# Patient Record
Sex: Female | Born: 1935
Health system: Southern US, Community
[De-identification: ages and names within clinical notes are randomized; demographics above are authoritative.]

## PROBLEM LIST (undated history)

## (undated) DIAGNOSIS — C801 Malignant (primary) neoplasm, unspecified: Secondary | ICD-10-CM

## (undated) DIAGNOSIS — E785 Hyperlipidemia, unspecified: Secondary | ICD-10-CM

## (undated) DIAGNOSIS — E0590 Thyrotoxicosis, unspecified without thyrotoxic crisis or storm: Secondary | ICD-10-CM

## (undated) DIAGNOSIS — R011 Cardiac murmur, unspecified: Secondary | ICD-10-CM

## (undated) DIAGNOSIS — I10 Essential (primary) hypertension: Secondary | ICD-10-CM

## (undated) DIAGNOSIS — R12 Heartburn: Secondary | ICD-10-CM

## (undated) DIAGNOSIS — L570 Actinic keratosis: Secondary | ICD-10-CM

## (undated) HISTORY — PX: TOTAL HIP ARTHROPLASTY: SHX124

## (undated) HISTORY — DX: Thyrotoxicosis, unspecified without thyrotoxic crisis or storm: E05.90

## (undated) HISTORY — DX: Hyperlipidemia, unspecified: E78.5

## (undated) HISTORY — DX: Actinic keratosis: L57.0

## (undated) HISTORY — PX: MEDIAL PARTIAL KNEE REPLACEMENT: SHX5965

## (undated) HISTORY — DX: Cardiac murmur, unspecified: R01.1

## (undated) HISTORY — PX: GALLBLADDER SURGERY: SHX652

## (undated) HISTORY — PX: OTHER SURGICAL HISTORY: SHX169

## (undated) HISTORY — PX: EXCISIONAL HEMORRHOIDECTOMY: SHX1541

## (undated) HISTORY — DX: Heartburn: R12

## (undated) HISTORY — DX: Essential (primary) hypertension: I10

---

## 1959-08-18 HISTORY — PX: VAGINAL HYSTERECTOMY: SUR661

## 2004-10-18 ENCOUNTER — Other Ambulatory Visit: Payer: Self-pay

## 2004-10-18 ENCOUNTER — Emergency Department: Payer: Self-pay | Admitting: General Practice

## 2004-10-23 ENCOUNTER — Ambulatory Visit: Payer: Self-pay | Admitting: Internal Medicine

## 2004-10-30 ENCOUNTER — Ambulatory Visit: Payer: Self-pay | Admitting: Radiology

## 2004-11-02 ENCOUNTER — Emergency Department: Payer: Self-pay | Admitting: Emergency Medicine

## 2004-11-13 ENCOUNTER — Ambulatory Visit: Payer: Self-pay | Admitting: Internal Medicine

## 2004-11-18 ENCOUNTER — Ambulatory Visit: Payer: Self-pay | Admitting: Internal Medicine

## 2005-01-20 ENCOUNTER — Ambulatory Visit: Payer: Self-pay | Admitting: Internal Medicine

## 2005-05-29 ENCOUNTER — Ambulatory Visit: Payer: Self-pay | Admitting: Internal Medicine

## 2005-07-24 ENCOUNTER — Ambulatory Visit: Payer: Self-pay | Admitting: Internal Medicine

## 2006-06-02 ENCOUNTER — Ambulatory Visit: Payer: Self-pay | Admitting: Internal Medicine

## 2007-06-09 ENCOUNTER — Ambulatory Visit: Payer: Self-pay | Admitting: Internal Medicine

## 2008-01-03 ENCOUNTER — Ambulatory Visit: Payer: Self-pay

## 2008-02-16 ENCOUNTER — Ambulatory Visit: Payer: Self-pay | Admitting: Urology

## 2008-04-24 ENCOUNTER — Ambulatory Visit: Payer: Self-pay | Admitting: Unknown Physician Specialty

## 2008-07-04 ENCOUNTER — Ambulatory Visit: Payer: Self-pay | Admitting: Internal Medicine

## 2009-06-26 ENCOUNTER — Ambulatory Visit: Payer: Self-pay | Admitting: Otolaryngology

## 2009-07-09 ENCOUNTER — Ambulatory Visit: Payer: Self-pay | Admitting: Internal Medicine

## 2009-09-10 ENCOUNTER — Ambulatory Visit: Payer: Self-pay | Admitting: Unknown Physician Specialty

## 2009-12-30 ENCOUNTER — Encounter: Payer: Self-pay | Admitting: Otolaryngology

## 2010-01-15 ENCOUNTER — Encounter: Payer: Self-pay | Admitting: Otolaryngology

## 2010-02-14 ENCOUNTER — Encounter: Payer: Self-pay | Admitting: Otolaryngology

## 2010-07-18 ENCOUNTER — Ambulatory Visit: Payer: Self-pay | Admitting: Internal Medicine

## 2011-07-20 ENCOUNTER — Ambulatory Visit: Payer: Self-pay | Admitting: Internal Medicine

## 2011-11-12 ENCOUNTER — Ambulatory Visit: Payer: Self-pay | Admitting: Podiatry

## 2011-12-16 ENCOUNTER — Ambulatory Visit: Payer: Self-pay

## 2012-03-01 ENCOUNTER — Ambulatory Visit: Payer: Self-pay | Admitting: Unknown Physician Specialty

## 2012-03-21 ENCOUNTER — Ambulatory Visit: Payer: Self-pay | Admitting: Unknown Physician Specialty

## 2012-07-20 ENCOUNTER — Ambulatory Visit: Payer: Self-pay | Admitting: Internal Medicine

## 2013-06-09 ENCOUNTER — Inpatient Hospital Stay: Payer: Self-pay | Admitting: Internal Medicine

## 2013-06-09 LAB — CBC
HCT: 40.5 % (ref 35.0–47.0)
HGB: 14.2 g/dL (ref 12.0–16.0)
MCH: 31.8 pg (ref 26.0–34.0)
RBC: 4.47 10*6/uL (ref 3.80–5.20)

## 2013-06-09 LAB — HEMOGLOBIN A1C: Hemoglobin A1C: 5.9 % (ref 4.2–6.3)

## 2013-06-09 LAB — URINALYSIS, COMPLETE
Bacteria: NONE SEEN
Bilirubin,UR: NEGATIVE
Glucose,UR: NEGATIVE mg/dL (ref 0–75)
Ketone: NEGATIVE
Leukocyte Esterase: NEGATIVE
Ph: 6 (ref 4.5–8.0)
Protein: 30
Specific Gravity: 1.02 (ref 1.003–1.030)
Squamous Epithelial: NONE SEEN

## 2013-06-09 LAB — BASIC METABOLIC PANEL
Anion Gap: 5 — ABNORMAL LOW (ref 7–16)
BUN: 20 mg/dL — ABNORMAL HIGH (ref 7–18)
Co2: 25 mmol/L (ref 21–32)
EGFR (African American): 60 — ABNORMAL LOW
EGFR (Non-African Amer.): 51 — ABNORMAL LOW
Glucose: 134 mg/dL — ABNORMAL HIGH (ref 65–99)
Potassium: 5 mmol/L (ref 3.5–5.1)
Sodium: 134 mmol/L — ABNORMAL LOW (ref 136–145)

## 2013-06-09 LAB — TSH: Thyroid Stimulating Horm: 0.527 u[IU]/mL

## 2013-06-10 LAB — BASIC METABOLIC PANEL
Anion Gap: 3 — ABNORMAL LOW (ref 7–16)
Calcium, Total: 9.3 mg/dL (ref 8.5–10.1)
Chloride: 101 mmol/L (ref 98–107)
Co2: 29 mmol/L (ref 21–32)
Creatinine: 0.91 mg/dL (ref 0.60–1.30)
Glucose: 120 mg/dL — ABNORMAL HIGH (ref 65–99)
Sodium: 133 mmol/L — ABNORMAL LOW (ref 136–145)

## 2013-06-10 LAB — CBC WITH DIFFERENTIAL/PLATELET
Basophil %: 0.5 %
HCT: 34.5 % — ABNORMAL LOW (ref 35.0–47.0)
HGB: 12.1 g/dL (ref 12.0–16.0)
Lymphocyte %: 19.5 %
MCHC: 35.1 g/dL (ref 32.0–36.0)
MCV: 91 fL (ref 80–100)
Monocyte #: 0.9 x10 3/mm (ref 0.2–0.9)
Monocyte %: 7.8 %
Neutrophil #: 7.7 10*3/uL — ABNORMAL HIGH (ref 1.4–6.5)
Neutrophil %: 70.9 %
RDW: 13.7 % (ref 11.5–14.5)

## 2013-06-11 LAB — CBC WITH DIFFERENTIAL/PLATELET
Basophil #: 0 10*3/uL (ref 0.0–0.1)
Basophil %: 0.4 %
Eosinophil #: 0.4 10*3/uL (ref 0.0–0.7)
Eosinophil %: 3.5 %
HCT: 30.2 % — ABNORMAL LOW (ref 35.0–47.0)
HGB: 10.8 g/dL — ABNORMAL LOW (ref 12.0–16.0)
Lymphocyte #: 1.5 10*3/uL (ref 1.0–3.6)
Lymphocyte %: 13.9 %
MCH: 32.4 pg (ref 26.0–34.0)
MCHC: 35.8 g/dL (ref 32.0–36.0)
MCV: 90 fL (ref 80–100)
Monocyte #: 0.9 x10 3/mm (ref 0.2–0.9)
Monocyte %: 8.3 %
Platelet: 159 10*3/uL (ref 150–440)
RBC: 3.35 10*6/uL — ABNORMAL LOW (ref 3.80–5.20)
RDW: 13.3 % (ref 11.5–14.5)
WBC: 11 10*3/uL (ref 3.6–11.0)

## 2013-06-11 LAB — BASIC METABOLIC PANEL
Anion Gap: 5 — ABNORMAL LOW (ref 7–16)
BUN: 8 mg/dL (ref 7–18)
Calcium, Total: 8.8 mg/dL (ref 8.5–10.1)
Co2: 26 mmol/L (ref 21–32)
Creatinine: 0.7 mg/dL (ref 0.60–1.30)
Potassium: 3.9 mmol/L (ref 3.5–5.1)
Sodium: 130 mmol/L — ABNORMAL LOW (ref 136–145)

## 2013-06-12 LAB — HEMOGLOBIN: HGB: 10 g/dL — ABNORMAL LOW (ref 12.0–16.0)

## 2013-06-12 LAB — BASIC METABOLIC PANEL
Anion Gap: 6 — ABNORMAL LOW (ref 7–16)
BUN: 6 mg/dL — ABNORMAL LOW (ref 7–18)
Calcium, Total: 8.9 mg/dL (ref 8.5–10.1)
Chloride: 101 mmol/L (ref 98–107)
Creatinine: 0.75 mg/dL (ref 0.60–1.30)
EGFR (African American): >60
EGFR (Non-African Amer.): >60
Glucose: 112 mg/dL — ABNORMAL HIGH (ref 65–99)
Osmolality: 265 (ref 275–301)

## 2013-06-13 LAB — CBC WITH DIFFERENTIAL/PLATELET
Basophil #: 0.1 10*3/uL (ref 0.0–0.1)
Eosinophil #: 0.3 10*3/uL (ref 0.0–0.7)
Eosinophil %: 3.3 %
Lymphocyte #: 1.5 10*3/uL (ref 1.0–3.6)
Lymphocyte %: 16.2 %
MCHC: 36.2 g/dL — ABNORMAL HIGH (ref 32.0–36.0)
MCV: 90 fL (ref 80–100)
Monocyte #: 0.9 x10 3/mm (ref 0.2–0.9)
Monocyte %: 9.1 %
Neutrophil #: 6.6 10*3/uL — ABNORMAL HIGH (ref 1.4–6.5)
Neutrophil %: 70.8 %
RDW: 13.5 % (ref 11.5–14.5)

## 2013-06-13 LAB — BASIC METABOLIC PANEL
Anion Gap: 2 — ABNORMAL LOW (ref 7–16)
BUN: 9 mg/dL (ref 7–18)
Chloride: 104 mmol/L (ref 98–107)
Creatinine: 0.76 mg/dL (ref 0.60–1.30)
EGFR (Non-African Amer.): >60
Glucose: 100 mg/dL — ABNORMAL HIGH (ref 65–99)
Sodium: 134 mmol/L — ABNORMAL LOW (ref 136–145)

## 2013-06-14 ENCOUNTER — Encounter: Payer: Self-pay | Admitting: Internal Medicine

## 2013-06-17 ENCOUNTER — Encounter: Payer: Self-pay | Admitting: Internal Medicine

## 2013-06-20 LAB — CBC WITH DIFFERENTIAL/PLATELET
Basophil: 2 %
Comment - H1-Com1: NORMAL
Eosinophil: 1 %
HGB: 12.2 g/dL (ref 12.0–16.0)
MCH: 31.6 pg (ref 26.0–34.0)
MCV: 90 fL (ref 80–100)
Metamyelocyte: 2 %
Platelet: 434 10*3/uL (ref 150–440)
RBC: 3.86 10*6/uL (ref 3.80–5.20)
RDW: 13.7 % (ref 11.5–14.5)
Segmented Neutrophils: 72 %
WBC: 10 10*3/uL (ref 3.6–11.0)

## 2013-06-20 LAB — BASIC METABOLIC PANEL
Anion Gap: 2 — ABNORMAL LOW (ref 7–16)
Calcium, Total: 10.7 mg/dL — ABNORMAL HIGH (ref 8.5–10.1)
Co2: 32 mmol/L (ref 21–32)
Creatinine: 0.8 mg/dL (ref 0.60–1.30)
EGFR (Non-African Amer.): >60
Osmolality: 269 (ref 275–301)
Potassium: 3.8 mmol/L (ref 3.5–5.1)
Sodium: 134 mmol/L — ABNORMAL LOW (ref 136–145)

## 2013-07-09 ENCOUNTER — Emergency Department: Payer: Self-pay | Admitting: Emergency Medicine

## 2013-07-09 LAB — COMPREHENSIVE METABOLIC PANEL
Anion Gap: 4 — ABNORMAL LOW (ref 7–16)
Bilirubin,Total: 0.5 mg/dL (ref 0.2–1.0)
Calcium, Total: 10.6 mg/dL — ABNORMAL HIGH (ref 8.5–10.1)
Chloride: 101 mmol/L (ref 98–107)
Creatinine: 0.9 mg/dL (ref 0.60–1.30)
EGFR (African American): >60
Osmolality: 271 (ref 275–301)
Sodium: 135 mmol/L — ABNORMAL LOW (ref 136–145)
Total Protein: 6.9 g/dL (ref 6.4–8.2)

## 2013-07-09 LAB — CBC
MCV: 91 fL (ref 80–100)
Platelet: 273 10*3/uL (ref 150–440)
RDW: 14.1 % (ref 11.5–14.5)
WBC: 8.6 10*3/uL (ref 3.6–11.0)

## 2013-07-09 LAB — TROPONIN I: Troponin-I: <0.02 ng/mL

## 2013-07-17 ENCOUNTER — Encounter: Payer: Self-pay | Admitting: Internal Medicine

## 2013-08-17 ENCOUNTER — Encounter: Payer: Self-pay | Admitting: Internal Medicine

## 2013-10-23 ENCOUNTER — Ambulatory Visit: Payer: Self-pay | Admitting: Internal Medicine

## 2014-04-02 DIAGNOSIS — E039 Hypothyroidism, unspecified: Secondary | ICD-10-CM | POA: Insufficient documentation

## 2014-12-04 ENCOUNTER — Other Ambulatory Visit (HOSPITAL_BASED_OUTPATIENT_CLINIC_OR_DEPARTMENT_OTHER): Payer: Self-pay | Admitting: Internal Medicine

## 2014-12-05 ENCOUNTER — Other Ambulatory Visit: Payer: Self-pay | Admitting: Internal Medicine

## 2014-12-05 DIAGNOSIS — Z1231 Encounter for screening mammogram for malignant neoplasm of breast: Secondary | ICD-10-CM

## 2014-12-07 NOTE — H&P (Signed)
PATIENT NAME:  Erica Lane, Erica Lane MR#:  [id number] DATE OF BIRTH:  1927-09-05  DATE OF ADMISSION:  06/09/2013  Addendum. This is a continuation.  MEDICATION LIST: Aspirin 81 mg p.o. daily, cyclobenzaprine 10 mg p.o. at bedtime, levothyroxine 100 mcg p.o. daily, metoprolol tartrate 25 mg twice daily, ranitidine 150 mg p.o. at bedtime, Reclast 5 mg in 100 mL intravenous solution once yearly, simvastatin 80 mg p.o. at bedtime, sucralfate 1 gram 4 times daily before meals and at bedtime and vitamin D2 at 50,000 units once a week.   ASSESSMENT AND PLAN: 1.  Right intratrochanteric hip fracture: The patient does have minimal risks for cardiovascular complications. She is okay to go to the operating room as long as her blood pressure is better controlled. 2.  Malignant hypertension: Suspect pain related. Will give 1 oral ACE inhibitor/lisinopril dose. Will follow the patient's blood pressure readings. Will continue the patient on metoprolol. Add also nitroglycerin topically. Follow blood pressure readings and we will make decisions about  blood pressure medications/advancement of blood pressure medications as needed.  3.  Hyponatremia: Very likely due to (Dictation Anomaly) resulting intravascular depletion after fracture. Will continue the patient on IV fluids. 4.  Hypercalcemia: Get TSH as well as intact PTH. 6.  Gastroesophageal reflux disease: The patient is not on proton pump inhibitor. We will continue ranitidine as well as sucralfate.  7.  Hyperlipidemia: Continue simvastatin.   TIME SPENT: 50 minutes.   ____________________________ Theodoro Grist, MD rv:jm D: 06/09/2013 17:36:15 ET T: 06/09/2013 18:31:17 ET JOB#: [id number]  cc: Theodoro Grist, MD, <Dictator> Kaiea Esselman MD ELECTRONICALLY SIGNED 07/15/2013 20:24

## 2014-12-07 NOTE — Discharge Summary (Signed)
PATIENT NAME:  Erica Lane, Erica Lane MR#:  [id number] DATE OF BIRTH:  08/12/1928  DATE OF ADMISSION:  06/09/2013 DATE OF DISCHARGE:  06/13/2013  TYPE OF DISCHARGE: The patient is transferred to skilled nursing facility.   REASON FOR ADMISSION: A right hip fracture.   HISTORY OF PRESENT ILLNESS: The patient is an 81 year old female with a history of benign hypertension, reflux, osteoporosis, and hyperlipidemia who presented to the Emergency Room after falling in the kitchen. In the Emergency Room, the patient was found to have a right hip fracture and was admitted for further evaluation.   PAST MEDICAL HISTORY: 1.  Benign hypertension.  2.  Hypothyroidism.  3.  GE reflux disease.  4.  Osteoporosis.  5.  Hyperlipidemia.  6.  Status post hysterectomy.  7.  Status post cholecystectomy.  8.  Status post cataract surgery.  9.  Status post right knee surgery.   MEDICATIONS ON ADMISSION: Please see admission note.   ALLERGIES: No known drug allergies.   SOCIAL HISTORY, FAMILY HISTORY, REVIEW OF SYSTEMS:  As per admission note.   PHYSICAL EXAM: GENERAL: The patient was in no acute distress.  VITAL SIGNS: Stable and she was afebrile.  HEENT exam was unremarkable.  NECK: Supple without JVD.  LUNGS:  Clear.  CARDIAC: Regular rate and rhythm. Normal S1 and S2. No significant murmurs.  ABDOMEN: Soft and nontender.  EXTREMITIES: Without edema.  NEUROLOGIC: Exam is grossly nonfocal.   HOSPITAL COURSE: The patient was admitted with a right hip fracture. Labs were essentially stable. She was seen in consultation by orthopedics and taken to the [facility] [place] on 10/25. She tolerated the hip surgery well. There were no complications. Postoperative, the patient was started with physical therapy and was ambulated while in the hospital. Her pain was well-controlled and there were no other issues that arose.  Short-term rehab was recommended and the family and patient agreed. She is now transferred to the skilled  nursing facility for further care and rehabilitation.   DISCHARGE DIAGNOSES: 1.  Right hip fracture, status post surgical repair.  2.  Benign hypertension.  3.  Anemia of chronic disease.  4.  Hyperlipidemia.  5.  Osteoporosis.  6.  Hypothyroidism.  7.  Status post hysterectomy.  8.  Status post cholecystectomy.   DISCHARGE MEDICATIONS: 1.  Flexeril 10 mg p.o. at bedtime.  2.  Vitamin D2 50,000 units p.o. q. week.  3.  Synthroid 100 mcg p.o. daily.  4.  Lopressor 25 mg p.o. b.i.d.  5.  Nitro-Dur 0.4 mg topically daily, off at bedtime.  6.  Protonix 40 mg p.o. q.a.m.  7.  Zantac 150 mg p.o. at bedtime.  8.  Zocor 80 mg p.o. at bedtime.  9.  Carafate 1 gram p.o. before meals and at bedtime.  10.  Norco 5/325, 1 to 2 p.o. every 6 hours p.r.n. pain.  11.  Zofran 4 mg p.o. q. 4 hours p.r.n. nausea and vomiting.  12.  Os-Cal D 1 p.o. b.i.d.  13.  Surfak 240 mg p.o. at bedtime.  14.  Iron sulfate 325 mg p.o. b.i.d.   FOLLOW-UP PLANS AND APPOINTMENTS: The patient will be followed by the resident physician at the skilled nursing facility. She is a FULL CODE. She is on a 2 gram sodium diet. She will be seen in consultation by physical therapy. We will obtain a CBC and a MET-B in 1 week.   ____________________________ Leonie Douglas. Doy Hutching, MD jds:dp D: 06/13/2013 07:22:45 ET T: 06/13/2013 07:48:09 ET JOB#: [id number]  cc: Leonie Douglas. Doy Hutching, MD, <Dictator> Fouad Taul Lennice Sites MD ELECTRONICALLY SIGNED 06/13/2013 13:21

## 2014-12-07 NOTE — H&P (Signed)
PATIENT NAME:  Erica Lane, Erica Lane MR#:  [id number] DATE OF BIRTH:  1928-07-04  PRIMARY CARE PHYSICIAN: Dr. Doy Hutching.   HISTORY OF PRESENT ILLNESS: The patient is an 81 year old Caucasian female with past medical history significant for history of hypertension, gastroesophageal reflux disease, osteoporosis, hyperlipidemia, who presents to the hospital after a fall. According to the patient, she was doing well. She was working at the [employer] after lunch and suddenly she turned around at the sink and her Crocs apparently stuck and she fell down on the right hip. She denies any loss of consciousness. She was not able to stand up and she was not even able to move her right leg.   On arrival to the Emergency Room, she was noted to be hypertensive with systolic blood pressure above 200s and she was noted also to have right intratrochanteric hip fracture. Hospitalist services were contacted for admission.   The patient denies any chest pains with activities. 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 She gets tired sometimes after significant exertion quicker than she used to be but still is able to do all activities of daily living and take care of her husband.   PAST MEDICAL HISTORY: Significant for history of partial hysterectomy, cholecystectomy, history of hypertension, gastroesophageal reflux disease, osteoporosis, hyperlipidemia, partial right knee replacement, hypothyroidism, status post cataract removal bilaterally.   ALLERGIES: None.   FAMILY HISTORY: Negative for early coronary artery disease, diabetes mellitus. The patient's mother had CVA as well as MI at the age of 59. The patient's father had intestinal cancer.   SOCIAL HISTORY: The patient is married and lives with her husband. No smoking, current or  prior, or alcohol abuse. 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 She  has two daughters and one son.   REVIEW OF SYSTEMS: Positive for pains in the right hip. Also headaches for over the past 2  weeks. Admits of having some recently diagnosed sinusitis. Admits of feeling tired intermittently, more frequently, in fact, than she used to be, but still able to do a lot. No chest pains. Intermittent UTIs. No fevers, chills, fatigue, weakness, weight loss or gain.  EYES: Denies blurry vision, double vision, glaucoma or cataracts.  ENT:  Denies any tinnitus, allergies, epistaxis, sinus tenderness or difficulty swallowing. RESPIRATORY: Denies wheezing, asthma, or COPD. CARDIOVASCULAR: Denies any chest pains, orthopnea, edema, arrhythmia, dyspnea on exertion, syncope.  GASTROINTESTINAL: Denies nausea, vomiting, diarrhea, constipation.  GENITOURINARY: Denies dysuria, hematuria, frequency or incontinence.  ENDOCRINE: Denies polydipsia, polyuria, nocturia, thyroid problems, heat or cold intolerance. HEMOLYMPHATIC: Denies anemia, easy bruising, bleeding or swollen glands.  SKIN: Denies any acne, rashes, lesions or change in moles.  MUSCULOSKELETAL: Denies arthritis, cramps, swelling.  NEUROLOGIC: Denies numbness, epilepsy or tremors. PSYCHIATRIC: Denies anxiety, insomnia, depression.   PHYSICAL EXAMINATION:  VITAL SIGNS: On arrival to the hospital, temperature 97.7. Pulse was 61. Respiratory rate was 18. Blood pressure 229/101. Saturation was 95% on room air.  GENERAL: A well-developed, well-nourished, Caucasian female in no significant distress.  HEENT: Pupils equal, react to light. Extraocular muscles are intact. No icterus or conjunctivitis. Has normal hearing. No pharyngeal erythema. Mucosa is moist.  NECK: No masses. Supple, nontender, no thyroid mass. No adenopathy. No JVD or carotid bruits bilaterally. Full range of motion.  LUNGS: Clear to auscultation in all fields. No rales, rhonchi, diminished breath sounds or wheezing.  No labored inspiration, increased effort,  dullness to percussion or overt respiratory distress.  CARDIOVASCULAR: S1, S2 appreciated. (Dictation Anomaly)Rythm is regular . Chest is nontender to palpation.  EXTREMITIES: With 1+ pedal pulses. No lower extremity edema, clubbing or cyanosis was noted.  ABDOMEN: Soft, nontender. Bowel sounds are present. No hepatosplenomegaly. No other masses were noted.  RECTAL: Deferred.  MUSCLE STRENGTH: Able to move all extremities but the right lower extremity. No cyanosis, degenerative joint disease. Not able to assess her for kyphosis as she is lying in the bed. Gait is not tested.  SKIN: Did not reveal any rashes, lesions, erythema, nodularity or induration.  It was warm and dry to palpation.   LYMPHATICS:  No adenopathy in the cervical region.  NEUROLOGIC: Cranial nerves grossly intact. Sensory is intact. No dysarthria or aphasia. The patient is alert and oriented to time, person, and place, cooperative. Memory is good.  PSYCHIATRIC: No significant confusion, agitation or depression noted.   EKG showed a sinus brady at 59 beats per minute. Normal axis, ST abnormality. Consider digitalis effect according to EKG criteria. No acute ST-T changes, however, were noted.   LABORATORIES: BMP showed sodium of 134, BUN elevated to 20, glucose 134; otherwise, BMP was unremarkable. The patient's calcium level was elevated to 10.6. CBC within normal limits. Coagulation panel unremarkable.   URINALYSIS: Yellow clear urine, negative for glucose, bilirubin or ketones. Specific gravity 1.020, and pH was 6.0, negative for blood, 30 mg/dL protein, negative for nitrites or leukocyte esterase, 8 red blood cells, 1 white blood cell. No bacteria or epithelial cells were noted. Five hyaline casts as well as amorphous crystal were present.   RADIOLOGIC STUDIES: Chest, one-view x-ray, 24th of October 2014, showed no acute abnormality. No pneumothorax. No displaced rib fracture. Pelvis AP only revealed displaced right  intertrochanteric hip fracture with fracture of  lesser trochanter as well. Right hip, complete x-ray, 24th of  October 2014, showed right intratrochanteric hip fracture, displaced fracture of the lesser trochanter.   ASSESSMENT AND PLAN:  1.  Right hip fracture. Minimal risk   DICTATION ENDS HERE    ____________________________ Theodoro Grist, MD rv:np D: 06/09/2013 17:33:00 ET T: 06/09/2013 18:38:29 ET JOB#: [id number]  cc: Leonie Douglas. Doy Hutching, MD Theodoro Grist, MD, <Dictator>   [place] MD ELECTRONICALLY SIGNED 07/15/2013 16:57

## 2014-12-07 NOTE — Consult Note (Signed)
Brief Consult Note: Diagnosis: Right hip pain.   Patient was seen by consultant.   Recommend to proceed with surgery or procedure.   Recommend further assessment or treatment.   Orders entered.   Discussed with Attending MD.   Comments: 81 year old female twisted and fell in her kitchen this afternoon and fell on her right hip.  Brought to Emergency Room where exam and X-rays show a comminuted, displaced, intertrochanteric hip fracture. She is being admitted for medical evaluation and operative fixation of the fracture to assist in mobility and recovery.  Risks and benefits of surgery were discussed at length including but not limited to infection, non union, nerve or blood vessed damage, non union, need for repeat surgery, blood clots and lung emboli, and death. Patient and daughter agree with this plan.  Will stabilize overnight and fix in AM.   Exam:  Alert and cooperative.  circulation/sensation/motor function good right leg.  Leg is short and rotated.  Severe pain with range of motion.  Skin normal. No edema.  Denies pain in rest of skeleton.    X-rays: as above  Plan:  open reduction and internal fixation right hip in AM with Trochanteric Fixation Nail.  Electronic Signatures: Park Breed (MD)  (Signed 24-Oct-14 19:59)  Authored: Brief Consult Note   Last Updated: 24-Oct-14 19:59 by Park Breed (MD)

## 2014-12-07 NOTE — Op Note (Signed)
PATIENT NAME:  Erica Lane, Erica Lane MR#:  [id number] DATE OF BIRTH:  09/06/1927  DATE OF PROCEDURE:  06/10/2013  PREOPERATIVE DIAGNOSIS:  Comminuted displaced right intratrochanteric hip fracture.   POSTOPERATIVE DIAGNOSIS:  Comminuted displaced right intratrochanteric hip fracture.   PROCEDURE PERFORMED:  Open reduction and internal fixation of right hip with a trochanteric fixation nail device (130-degree/11-mm intramedullary rod, 782-UM helical blade, a 35-TI distal screw).   SURGEON:  Park Breed, M.D.   ANESTHESIA:  Spinal.   COMPLICATIONS:  None.   DRAINS:  None.   ESTIMATED BLOOD LOSS:  50 mL.  REPLACEMENT:  None.   DESCRIPTION OF PROCEDURE:  The patient was brought to the Operating Room where she underwent a satisfactory spinal anesthesia, was placed on a fracture table. The left leg was flexed and abducted and  the right leg was placed in traction and internally rotated. Fluoroscopy showed good position of the fracture fragments. The hip was prepped and draped in sterile fashion and a short longitudinal incision was made above the trochanter. Dissection was carried out sharply through subcutaneous tissue and fascia with electrocautery used for hemostasis. A guide pin was placed in the tip of the trochanter, and the large drill used to open the trochanteric entry point. The 130-degree x 11-mm short TFN device was inserted and under fluoroscopic control. The distal stab wound was made and the aiming device for the guidepin was inserted. The guidepin was inserted in excellent position on AP and lateral view to the very tip of the head. A step-cut reamer was used to enlarge the opening in the lateral femur and a long reamer was used to open a canal for the blade. A 144-RX helical blade was then inserted and seated fully. The traction was released. The rod and helical blade were seen to be in excellent position and alignment. The locking screw was advanced from the top and backed off  slightly. The aiming guide for the distal locking screw was inserted after a small stab wound was made. This was drilled and a 36-mm distal locking screw inserted. The outside hardware removed. The wounds were irrigated. Final x-rays showed excellent position of the hardware and fracture fragments. The fascia was closed with 0 Vicryl. The subcutaneous tissues were all closed with 2-0 Vicryl, and the skin was closed with staples. A dry sterile dressing applied, and the patient was transferred to her hospital bed and taken to Recovery in good condition. The hip had excellent motion and good alignment.  ____________________________ Park Breed, MD hem:jm D: 06/10/2013 13:58:22 ET T: 06/10/2013 14:33:23 ET JOB#: [id number]  cc: Park Breed, MD, <Dictator> Park Breed MD ELECTRONICALLY SIGNED 06/10/2013 18:48

## 2014-12-11 ENCOUNTER — Other Ambulatory Visit: Payer: Self-pay | Admitting: Internal Medicine

## 2014-12-11 DIAGNOSIS — Z139 Encounter for screening, unspecified: Secondary | ICD-10-CM

## 2014-12-19 ENCOUNTER — Other Ambulatory Visit: Payer: Self-pay | Admitting: Internal Medicine

## 2014-12-19 ENCOUNTER — Ambulatory Visit
Admission: RE | Admit: 2014-12-19 | Discharge: 2014-12-19 | Disposition: A | Discharge status: Home / Self Care | Payer: Medicare Other | Source: Ambulatory Visit | Attending: Internal Medicine | Admitting: Internal Medicine

## 2014-12-19 DIAGNOSIS — Z1231 Encounter for screening mammogram for malignant neoplasm of breast: Secondary | ICD-10-CM

## 2014-12-19 HISTORY — DX: Malignant (primary) neoplasm, unspecified: C80.1

## 2015-10-04 DIAGNOSIS — I6523 Occlusion and stenosis of bilateral carotid arteries: Secondary | ICD-10-CM | POA: Insufficient documentation

## 2015-11-15 DIAGNOSIS — E782 Mixed hyperlipidemia: Secondary | ICD-10-CM | POA: Insufficient documentation

## 2015-11-15 DIAGNOSIS — I10 Essential (primary) hypertension: Secondary | ICD-10-CM | POA: Insufficient documentation

## 2015-11-15 DIAGNOSIS — I119 Hypertensive heart disease without heart failure: Secondary | ICD-10-CM | POA: Insufficient documentation

## 2015-12-06 ENCOUNTER — Other Ambulatory Visit: Payer: Self-pay | Admitting: Internal Medicine

## 2015-12-06 DIAGNOSIS — Z1231 Encounter for screening mammogram for malignant neoplasm of breast: Secondary | ICD-10-CM

## 2015-12-20 ENCOUNTER — Ambulatory Visit
Admission: RE | Admit: 2015-12-20 | Discharge: 2015-12-20 | Disposition: A | Discharge status: Home / Self Care | Payer: Medicare Other | Source: Ambulatory Visit | Attending: Internal Medicine | Admitting: Internal Medicine

## 2015-12-20 ENCOUNTER — Other Ambulatory Visit: Payer: Self-pay | Admitting: Internal Medicine

## 2015-12-20 DIAGNOSIS — Z1231 Encounter for screening mammogram for malignant neoplasm of breast: Secondary | ICD-10-CM | POA: Diagnosis present

## 2016-02-13 ENCOUNTER — Ambulatory Visit
Admission: RE | Admit: 2016-02-13 | Discharge: 2016-02-13 | Disposition: A | Discharge status: Home / Self Care | Payer: Medicare Other | Source: Ambulatory Visit | Attending: Physician Assistant | Admitting: Physician Assistant

## 2016-02-13 ENCOUNTER — Other Ambulatory Visit: Payer: Self-pay | Admitting: Physician Assistant

## 2016-02-13 DIAGNOSIS — M25475 Effusion, left foot: Secondary | ICD-10-CM

## 2016-09-07 DIAGNOSIS — E782 Mixed hyperlipidemia: Secondary | ICD-10-CM | POA: Diagnosis not present

## 2016-09-07 DIAGNOSIS — Z79899 Other long term (current) drug therapy: Secondary | ICD-10-CM | POA: Diagnosis not present

## 2016-09-07 DIAGNOSIS — E119 Type 2 diabetes mellitus without complications: Secondary | ICD-10-CM | POA: Insufficient documentation

## 2016-09-07 DIAGNOSIS — I10 Essential (primary) hypertension: Secondary | ICD-10-CM | POA: Diagnosis not present

## 2016-09-07 DIAGNOSIS — N393 Stress incontinence (female) (male): Secondary | ICD-10-CM | POA: Diagnosis not present

## 2016-09-07 DIAGNOSIS — E039 Hypothyroidism, unspecified: Secondary | ICD-10-CM | POA: Diagnosis not present

## 2016-09-15 DIAGNOSIS — I10 Essential (primary) hypertension: Secondary | ICD-10-CM | POA: Diagnosis not present

## 2016-09-17 DIAGNOSIS — L570 Actinic keratosis: Secondary | ICD-10-CM | POA: Diagnosis not present

## 2016-09-22 NOTE — Progress Notes (Signed)
09/23/2016 9:15 AM   [name] 02/09/1928 ON:[id number]  Referring provider: Idelle Crouch, MD [facility] [place], [place] [address]  Chief Complaint  Patient presents with  . New Patient (Initial Visit)    Hematuria referred by Dr. Doy Hutching    HPI: Patient is a 83 -year-old Caucasian female who presents today as a referral from their PCP, Dr. Doy Hutching, for microscopic hematuria.    Patient was found to have microscopic hematuria on 3 occasions with 4-10 RBC's/hpf.  Patient does have a prior history of microscopic hematuria and had a hematuria work up in 2009 with Dr. Jacqlyn Larsen.  Findings positive for stable CT from 11/07/2004. There is no evidence of obstructing calcified ureteral stone.  Multiple, stable, hepatic hemangiomas.  Stable, mild, RIGHT UPJ partial obstruction.  Stable, LEFT adnexal cyst.  Stable pancreatic head fullness and lucency. There has been no progression to suggest a progressive malignancy. No biliary obstruction is  noted. The CT is again stable from 10/18/2004.   She states she has a prior history of recurrent urinary tract infections, but there are no documented infections in the chart.   She does have a prior history of nephrolithiasis, trauma to the genitourinary tract or malignancies of the genitourinary tract.  She does not have a family medical history of nephrolithiasis, malignancies of the genitourinary tract or hematuria.   Today, she is having symptoms of urgency, nocturia x 2 and incontinence of stress and urge.  Her UA today demonstrates 3-10 RBC's.  She is not experiencing any suprapubic pain, abdominal pain or flank pain.  She denies any recent fevers, chills, nausea or vomiting.   She has not had any recent imaging studies.   She is not a smoker.  She is not exposed to secondhand smoke.  She has not worked with [employer].    PMH: Past Medical History:  Diagnosis Date  . Cancer ([place])    skin  . Heart  murmur   . Heartburn   . HLD (hyperlipidemia)   . HTN (hypertension)   . Hyperthyroidism     Surgical History: Past Surgical History:  Procedure Laterality Date  . broken foot    . EXCISIONAL HEMORRHOIDECTOMY    . GALLBLADDER SURGERY    . MEDIAL PARTIAL KNEE REPLACEMENT    . TOTAL HIP ARTHROPLASTY Right    from crushed hip    Home Medications:  Allergies as of 09/23/2016   No Known Allergies     Medication List       Accurate as of 09/23/16  9:15 AM. Always use your most recent med list.          aspirin EC 81 MG tablet Take by mouth.   carvedilol 6.25 MG tablet Commonly known as:  COREG Take by mouth.   DIGESTIVE ENZYMES PO Take by mouth.   hydrochlorothiazide 12.5 MG tablet Commonly known as:  HYDRODIURIL Take by mouth.   LACTOBACILLUS BIFIDUS PO Take by mouth.   levothyroxine 100 MCG tablet Commonly known as:  SYNTHROID, LEVOTHROID TAKE 1 TABLET (100 MCG TOTAL) BY MOUTH ONCE DAILY. TAKE ON AN EMPTY STOMACH WITH A GLASS OF WATER AT LEAST 30-60 MINUTES BEFORE BREAKFAST.   losartan 100 MG tablet Commonly known as:  COZAAR Take by mouth.   ranitidine 300 MG tablet Commonly known as:  ZANTAC Take by mouth.   SM CALCIUM 500/VITAMIN D3 500-400 MG-UNIT tablet Generic drug:  calcium-vitamin D Take by mouth.   solifenacin 5  MG tablet Commonly known as:  VESICARE Take by mouth.   sucralfate 1 g tablet Commonly known as:  CARAFATE Take by mouth.   Vitamin D (Ergocalciferol) 50000 units Caps capsule Commonly known as:  DRISDOL TAKE 1 CAPSULE BY MOUTH ONCE A WEEK       Allergies: No Known Allergies  Family History: Family History  Problem Relation Age of Onset  . Cancer Sister 79    stomach  . Prostate cancer Neg Hx   . Kidney cancer Neg Hx   . Bladder Cancer Neg Hx     Social History:  reports that she has never smoked. She has never used smokeless tobacco. She reports that she drinks alcohol. She reports that she does not use  drugs.  ROS: UROLOGY Frequent Urination?: Yes Hard to postpone urination?: Yes Burning/pain with urination?: No Get up at night to urinate?: Yes Leakage of urine?: Yes Urine stream starts and stops?: No Trouble starting stream?: No Do you have to strain to urinate?: No Blood in urine?: No Urinary tract infection?: No Sexually transmitted disease?: No Injury to kidneys or bladder?: No Painful intercourse?: No Weak stream?: No Currently pregnant?: No Vaginal bleeding?: No Last menstrual period?: n  Gastrointestinal Nausea?: No Vomiting?: No Indigestion/heartburn?: No Diarrhea?: No Constipation?: No  Constitutional Fever: No Night sweats?: No Weight loss?: No Fatigue?: No  Skin Skin rash/lesions?: No Itching?: No  Eyes Blurred vision?: No Double vision?: No  Ears/Nose/Throat Sore throat?: No Sinus problems?: No  Hematologic/Lymphatic Swollen glands?: No Easy bruising?: No  Cardiovascular Leg swelling?: No Chest pain?: No  Respiratory Cough?: No Shortness of breath?: No  Endocrine Excessive thirst?: No  Musculoskeletal Back pain?: No Joint pain?: No  Neurological Headaches?: No Dizziness?: No  Psychologic Depression?: No Anxiety?: No  Physical Exam: BP (!) 164/73   Pulse 61   Ht 5\' 3"  (1.6 m)   Wt 148 lb (67.1 kg)   BMI 26.22 kg/m   Constitutional: Well nourished. Alert and oriented, No acute distress. HEENT: [place] AT, moist mucus membranes. Trachea midline, no masses. Cardiovascular: No clubbing, cyanosis, or edema. Respiratory: Normal respiratory effort, no increased work of breathing. GI: Abdomen is soft, non tender, non distended, no abdominal masses. Liver and spleen not palpable.  No hernias appreciated.  Stool sample for occult testing is not indicated.   GU: No CVA tenderness.  No bladder fullness or masses.   Skin: No rashes, bruises or suspicious lesions. Lymph: No cervical or inguinal adenopathy. Neurologic: Grossly  intact, no focal deficits, moving all 4 extremities. Psychiatric: Normal mood and affect.  Laboratory Data: Lab Results  Component Value Date   WBC 8.6 07/09/2013   HGB 13.5 07/09/2013   HCT 38.8 07/09/2013   MCV 91 07/09/2013   PLT 273 07/09/2013    Lab Results  Component Value Date   CREATININE 0.90 07/09/2013     Lab Results  Component Value Date   HGBA1C 5.9 06/09/2013    Lab Results  Component Value Date   TSH 0.527 06/09/2013    Lab Results  Component Value Date   AST 18 07/09/2013   Lab Results  Component Value Date   ALT 17 07/09/2013     Urinalysis 3-10 RBC's.  See EPIC.    Assessment & Plan:     1. Microscopic hematuria  - I explained to the patient that there are a number of causes that can be associated with blood in the urine, such as stones, UTI's, damage to the urinary tract  and/or cancer.  - At this time, I felt that the patient warranted further urologic evaluation.   The AUA guidelines state that a CT urogram is the preferred imaging study to evaluate hematuria.  - I explained to the patient that a contrast material will be injected into a vein and that in rare instances, an allergic reaction can result and may even life threatening   The patient denies any allergies to contrast, iodine and/or seafood and is not taking metformin.  - Her reproductive status is hysterectomy  - Following the imaging study,  I've recommended a cystoscopy. I described how this is performed, typically in an office setting with a flexible cystoscope. We described the risks, benefits, and possible side effects, the most common of which is a minor amount of blood in the urine and/or burning which usually resolves in 24 to 48 hours.    - The patient had the opportunity to ask questions which were answered. Based upon this discussion, the patient is willing to proceed. Therefore, I've ordered: a CT Urogram and cystoscopy.  - The patient will return following all of the above  for discussion of the results.     - UA  - urine culture  - BUN + creatinine  2. Mixed incontinence  - wears light pads; not bothersome to the patient at this time  - she is unsure if she is taking Vesicare at this time  3. Nocturia  - she is not finding it bothersome at this time  - will continue to monitor      Return for CT Urogram report and cystoscopy.  These notes generated with voice recognition software. I apologize for typographical errors.  Zara Council, [facility] [address] [place],  [address] [phone]

## 2016-09-23 ENCOUNTER — Ambulatory Visit (INDEPENDENT_AMBULATORY_CARE_PROVIDER_SITE_OTHER): Payer: PPO | Admitting: Urology

## 2016-09-23 ENCOUNTER — Encounter: Payer: Self-pay | Admitting: Urology

## 2016-09-23 VITALS — BP 164/73 | HR 61 | Ht 63.0 in | Wt 148.0 lb

## 2016-09-23 DIAGNOSIS — R3129 Other microscopic hematuria: Secondary | ICD-10-CM

## 2016-09-23 DIAGNOSIS — R351 Nocturia: Secondary | ICD-10-CM

## 2016-09-23 DIAGNOSIS — N3946 Mixed incontinence: Secondary | ICD-10-CM

## 2016-09-23 NOTE — Patient Instructions (Addendum)
Hematuria, Adult Hematuria is blood in your urine. It can be caused by a bladder infection, kidney infection, prostate infection, kidney stone, or cancer of your urinary tract. Infections can usually be treated with medicine, and a kidney stone usually will pass through your urine. If neither of these is the cause of your hematuria, further workup to find out the reason may be needed. It is very important that you tell your health care provider about any blood you see in your urine, even if the blood stops without treatment or happens without causing pain. Blood in your urine that happens and then stops and then happens again can be a symptom of a very serious condition. Also, pain is not a symptom in the initial stages of many urinary cancers. Follow these instructions at home:  Drink lots of fluid, 3-4 quarts a day. If you have been diagnosed with an infection, cranberry juice is especially recommended, in addition to large amounts of water.  Avoid caffeine, tea, and carbonated beverages because they tend to irritate the bladder.  Avoid alcohol because it may irritate the prostate.  Take all medicines as directed by your health care provider.  If you were prescribed an antibiotic medicine, finish it all even if you start to feel better.  If you have been diagnosed with a kidney stone, follow your health care provider's instructions regarding straining your urine to catch the stone.  Empty your bladder often. Avoid holding urine for long periods of time.  After a bowel movement, women should cleanse front to back. Use each tissue only once.  Empty your bladder before and after sexual intercourse if you are a female. Contact a health care provider if:  You develop back pain.  You have a fever.  You have a feeling of sickness in your stomach (nausea) or vomiting.  Your symptoms are not better in 3 days. Return sooner if you are getting worse. Get help right away if:  You develop  severe vomiting and are unable to keep the medicine down.  You develop severe back or abdominal pain despite taking your medicines.  You begin passing a large amount of blood or clots in your urine.  You feel extremely weak or faint, or you pass out. This information is not intended to replace advice given to you by your health care provider. Make sure you discuss any questions you have with your health care provider. Document Released: 08/03/2005 Document Revised: 01/09/2016 Document Reviewed: 04/03/2013 Elsevier Interactive Patient Education  2017 [place] A computed tomography (CT) scan is a specialized X-ray scan. It uses X-rays and a computer to make pictures of different areas of your body. A CT scan can offer more detailed information than a regular X-ray exam. The CT scan provides data about internal organs, soft tissue structures, blood vessels, and bones.  The CT scanner is a large machine that takes pictures of your body as you move through the opening.  LET [facility] CARE PROVIDER KNOW ABOUT:  Any allergies you have.   All medicines you are taking, including vitamins, herbs, eye drops, creams, and over-the-counter medicines.   Previous problems you or members of your family have had with the use of anesthetics.   Any blood disorders you have.   Previous surgeries you have had.   Medical conditions you have. RISKS AND COMPLICATIONS  Generally, this is a safe procedure. However, as with any procedure, problems can occur. Possible problems include:   An allergic reaction  to the contrast material.   Development of cancer from excessive exposure to radiation. The risk of this is small.  BEFORE THE PROCEDURE   The day before the test, stop drinking caffeinated beverages. These include energy drinks, tea, soda, coffee, and hot chocolate.   On the day of the test:  About 4 hours before the test, stop eating and drinking anything but water as advised  by your health care provider.   Avoid wearing jewelry. You will have to partly or fully undress and wear a hospital gown. PROCEDURE   You will be asked to lie on a table with your arms above your head.   If contrast dye is to be used for the test, an IV tube will be inserted in your arm. The contrast dye will be injected into the IV tube. You might feel warm, or you may get a metallic taste in your mouth.   The table you will be lying on will move into a large machine that will do the scanning.   You will be able to see, hear, and talk to the person running the machine while you are in it. Follow that person's directions.   The CT machine will move around you to take pictures. Do not move while it is scanning. This helps to get a good image.   When the best possible pictures have been taken, the machine will be turned off. The table will be moved out of the machine. The IV tube will then be removed. AFTER THE PROCEDURE  Ask your health care provider when to follow up for your test results. This information is not intended to replace advice given to you by your health care provider. Make sure you discuss any questions you have with your health care provider. Document Released: 09/10/2004 Document Revised: 08/08/2013 Document Reviewed: 04/10/2013 Elsevier Interactive Patient Education  2017 [place].  Cystoscopy Cystoscopy is a procedure that is used to help diagnose and sometimes treat conditions that affect that lower urinary tract. The lower urinary tract includes the bladder and the tube that drains urine from the bladder out of the body (urethra). Cystoscopy is performed with a thin, tube-shaped instrument with a light and camera at the end (cystoscope). The cystoscope may be hard (rigid) or flexible, depending on the goal of the procedure.The cystoscope is inserted through the urethra, into the bladder. Cystoscopy may be recommended if you have:  Urinary tractinfections that  keep coming back (recurring).  Blood in the urine (hematuria).  Loss of bladder control (urinary incontinence) or an overactive bladder.  Unusual cells found in a urine sample.  A blockage in the urethra.  Painful urination.  An abnormality in the bladder found during an intravenous pyelogram (IVP) or CT scan. Cystoscopy may also be done to remove a sample of tissue to be examined under a microscope (biopsy). Tell a health care provider about:  Any allergies you have.  All medicines you are taking, including vitamins, herbs, eye drops, creams, and over-the-counter medicines.  Any problems you or family members have had with anesthetic medicines.  Any blood disorders you have.  Any surgeries you have had.  Any medical conditions you have.  Whether you are pregnant or may be pregnant. What are the risks? Generally, this is a safe procedure. However, problems may occur, including:  Infection.  Bleeding.  Allergic reactions to medicines.  Damage to other structures or organs. What happens before the procedure?  Ask your health care provider about:  Changing  or stopping your regular medicines. This is especially important if you are taking diabetes medicines or blood thinners.  Taking medicines such as aspirin and ibuprofen. These medicines can thin your blood. Do not take these medicines before your procedure if your health care provider instructs you not to.  Follow instructions from your health care provider about eating or drinking restrictions.  You may be given antibiotic medicine to help prevent infection.  You may have an exam or testing, such as X-rays of the bladder, urethra, or kidneys.  You may have urine tests to check for signs of infection.  Plan to have someone take you home after the procedure. What happens during the procedure?  To reduce your risk of infection,your health care team will wash or sanitize their hands.  You will be given one or  more of the following:  A medicine to help you relax (sedative).  A medicine to numb the area (local anesthetic).  The area around the opening of your urethra will be cleaned.  The cystoscope will be passed through your urethra into your bladder.  Germ-free (sterile)fluid will flow through the cystoscope to fill your bladder. The fluid will stretch your bladder so that your surgeon can clearly examine your bladder walls.  The cystoscope will be removed and your bladder will be emptied. The procedure may vary among health care providers and hospitals. What happens after the procedure?  You may have some soreness or pain in your abdomen and urethra. Medicines will be available to help you.  You may have some blood in your urine.  Do not drive for 24 hours if you received a sedative. This information is not intended to replace advice given to you by your health care provider. Make sure you discuss any questions you have with your health care provider. Document Released: 07/31/2000 Document Revised: 12/12/2015 Document Reviewed: 06/20/2015 Elsevier Interactive Patient Education  2017 [place].  Cystoscopy, Care After Refer to this sheet in the next few weeks. These instructions provide you with information about caring for yourself after your procedure. Your health care provider may also give you more specific instructions. Your treatment has been planned according to current medical practices, but problems sometimes occur. Call your health care provider if you have any problems or questions after your procedure. What can I expect after the procedure? After the procedure, it is common to have:  Mild pain when you urinate. Pain should stop within a few minutes after you urinate. This may last for up to 1 week.  A small amount of blood in your urine for several days.  Feeling like you need to urinate but producing only a small amount of urine. Follow these instructions at home:     Medicines  Take over-the-counter and prescription medicines only as told by your health care provider.  If you were prescribed an antibiotic medicine, take it as told by your health care provider. Do not stop taking the antibiotic even if you start to feel better. General instructions  Return to your normal activities as told by your health care provider. Ask your health care provider what activities are safe for you.  Do not drive for 24 hours if you received a sedative.  Watch for any blood in your urine. If the amount of blood in your urine increases, call your health care provider.  Follow instructions from your health care provider about eating or drinking restrictions.  If a tissue sample was removed for testing (biopsy) during your  procedure, it is your responsibility to get your test results. Ask your health care provider or the department performing the test when your results will be ready.  Drink enough fluid to keep your urine clear or pale yellow.  Keep all follow-up visits as told by your health care provider. This is important. Contact a health care provider if:  You have pain that gets worse or does not get better with medicine, especially pain when you urinate.  You have difficulty urinating. Get help right away if:  You have more blood in your urine.  You have blood clots in your urine.  You have abdominal pain.  You have a fever or chills.  You are unable to urinate. This information is not intended to replace advice given to you by your health care provider. Make sure you discuss any questions you have with your health care provider. Document Released: 02/20/2005 Document Revised: 01/09/2016 Document Reviewed: 06/20/2015 Elsevier Interactive Patient Education  2017 [employer].

## 2016-09-26 LAB — SPECIMEN STATUS REPORT

## 2016-09-28 ENCOUNTER — Telehealth: Payer: Self-pay

## 2016-09-28 DIAGNOSIS — N390 Urinary tract infection, site not specified: Secondary | ICD-10-CM

## 2016-09-28 MED ORDER — AMOXICILLIN-POT CLAVULANATE 875-125 MG PO TABS: 1.0000 | ORAL_TABLET | Freq: Two times a day (BID) | ORAL | 0 refills | Status: AC

## 2016-09-28 NOTE — Telephone Encounter (Signed)
-----   Message from Nori Riis, PA-C sent at 09/26/2016 12:13 PM EST ----- Patient has a +UCx.  They need to start Augmentin 875/125, one tablets twice daily for seven days.  They also need to take a probiotic with the antibiotic course.  The dosage is listed below:  L. acidophilus and L. casei (25 x 109 CFU/day for 2 days, then 50 x 109 CFU/day for duration of the antibiotic course).  I suggest she still undergo the CT Urogram on the 14th of February, but we will need to make sure the infection has cleared prior to her cystoscopy on 10/15/2016.  We will contact her after we receive the results of her CT to make arrangements to recheck her urine.

## 2016-09-28 NOTE — Telephone Encounter (Signed)
Spoke with pt in reference to +ucx, probiotic, CT and cysto. Made aware abx were sent to pharmacy. Pt voiced understanding of whole conversation.

## 2016-09-29 ENCOUNTER — Telehealth: Payer: Self-pay | Admitting: Urology

## 2016-09-30 ENCOUNTER — Ambulatory Visit: Admission: RE | Admit: 2016-09-30 | Payer: PPO | Source: Ambulatory Visit

## 2016-09-30 ENCOUNTER — Telehealth: Payer: Self-pay

## 2016-09-30 LAB — CULTURE, URINE COMPREHENSIVE

## 2016-09-30 NOTE — Telephone Encounter (Signed)
Patient has a +UCx. They need to start Augmentin 875/125, one tablets twice daily for seven days. They also need to take a probiotic with the antibiotic course. The dosage is listed below:  L. acidophilus and L. casei (25 x 109 CFU/day for 2 days, then 50 x 109 CFU/day for duration of the antibiotic course).  I suggest she still undergo the CT Urogram on the 14th of February, but we will need to make sure the infection has cleared prior to her cystoscopy on 10/15/2016. We will contact her after we receive the results of her CT to make arrangements to recheck her urine.

## 2016-10-07 ENCOUNTER — Ambulatory Visit
Admission: RE | Admit: 2016-10-07 | Discharge: 2016-10-07 | Disposition: A | Discharge status: Home / Self Care | Payer: PPO | Source: Ambulatory Visit | Attending: Urology | Admitting: Urology

## 2016-10-07 DIAGNOSIS — D1803 Hemangioma of intra-abdominal structures: Secondary | ICD-10-CM | POA: Insufficient documentation

## 2016-10-07 DIAGNOSIS — K5730 Diverticulosis of large intestine without perforation or abscess without bleeding: Secondary | ICD-10-CM | POA: Diagnosis not present

## 2016-10-07 DIAGNOSIS — E782 Mixed hyperlipidemia: Secondary | ICD-10-CM | POA: Diagnosis not present

## 2016-10-07 DIAGNOSIS — N289 Disorder of kidney and ureter, unspecified: Secondary | ICD-10-CM | POA: Diagnosis not present

## 2016-10-07 DIAGNOSIS — I10 Essential (primary) hypertension: Secondary | ICD-10-CM | POA: Diagnosis not present

## 2016-10-07 DIAGNOSIS — M4854XA Collapsed vertebra, not elsewhere classified, thoracic region, initial encounter for fracture: Secondary | ICD-10-CM | POA: Diagnosis not present

## 2016-10-07 DIAGNOSIS — E039 Hypothyroidism, unspecified: Secondary | ICD-10-CM | POA: Diagnosis not present

## 2016-10-07 DIAGNOSIS — N200 Calculus of kidney: Secondary | ICD-10-CM | POA: Diagnosis not present

## 2016-10-07 DIAGNOSIS — R3129 Other microscopic hematuria: Secondary | ICD-10-CM | POA: Diagnosis not present

## 2016-10-07 DIAGNOSIS — Z1231 Encounter for screening mammogram for malignant neoplasm of breast: Secondary | ICD-10-CM | POA: Diagnosis not present

## 2016-10-07 MED ORDER — IOPAMIDOL (ISOVUE-300) INJECTION 61%
125.0000 mL | Freq: Once | INTRAVENOUS | Status: AC | PRN
  Administered 2016-10-07: 125 mL via INTRAVENOUS

## 2016-10-15 ENCOUNTER — Emergency Department
Admission: EM | Admit: 2016-10-15 | Discharge: 2016-10-15 | Disposition: A | Discharge status: Home / Self Care | Payer: PPO | Attending: Emergency Medicine | Admitting: Emergency Medicine

## 2016-10-15 ENCOUNTER — Encounter: Payer: Self-pay | Admitting: Emergency Medicine

## 2016-10-15 ENCOUNTER — Encounter: Payer: Self-pay | Admitting: Urology

## 2016-10-15 ENCOUNTER — Ambulatory Visit: Payer: PPO | Admitting: Urology

## 2016-10-15 VITALS — BP 209/99 | HR 56 | Ht 63.0 in | Wt 151.1 lb

## 2016-10-15 DIAGNOSIS — Z85828 Personal history of other malignant neoplasm of skin: Secondary | ICD-10-CM | POA: Insufficient documentation

## 2016-10-15 DIAGNOSIS — Z7982 Long term (current) use of aspirin: Secondary | ICD-10-CM | POA: Diagnosis not present

## 2016-10-15 DIAGNOSIS — Z79899 Other long term (current) drug therapy: Secondary | ICD-10-CM | POA: Diagnosis not present

## 2016-10-15 DIAGNOSIS — I10 Essential (primary) hypertension: Secondary | ICD-10-CM | POA: Diagnosis not present

## 2016-10-15 DIAGNOSIS — R3129 Other microscopic hematuria: Secondary | ICD-10-CM

## 2016-10-15 LAB — MICROSCOPIC EXAMINATION: BACTERIA UA: NONE SEEN

## 2016-10-15 LAB — URINALYSIS, COMPLETE
BILIRUBIN UA: NEGATIVE
Glucose, UA: NEGATIVE
Ketones, UA: NEGATIVE
Leukocytes, UA: NEGATIVE
NITRITE UA: NEGATIVE
PH UA: 7 (ref 5.0–7.5)
Specific Gravity, UA: 1.01 (ref 1.005–1.030)
Urobilinogen, Ur: 0.2 mg/dL (ref 0.2–1.0)

## 2016-10-15 MED ORDER — CIPROFLOXACIN HCL 500 MG PO TABS: 500.0000 mg | ORAL_TABLET | Freq: Once | ORAL | Status: DC

## 2016-10-15 MED ORDER — LIDOCAINE HCL 2 % EX GEL: 1.0000 "application " | Freq: Once | CUTANEOUS | Status: DC

## 2016-10-15 MED ORDER — HYDROCHLOROTHIAZIDE 12.5 MG PO TABS: 12.5000 mg | ORAL_TABLET | Freq: Every day | ORAL | 0 refills | Status: DC

## 2016-10-15 MED ORDER — FAMOTIDINE 20 MG PO TABS: 20.0000 mg | ORAL_TABLET | Freq: Two times a day (BID) | ORAL | 0 refills | Status: DC

## 2016-10-15 NOTE — ED Provider Notes (Signed)
[facility] Provider Note  ____________________________________________   First MD Initiated Contact with Patient 10/15/16 1107     (approximate)  I have reviewed the triage vital signs and the nursing notes.   HISTORY  Chief Complaint Hypertension    HPI [facility] is a 83 y.o. female who is sent to the emergency department from her urologist for elevated blood pressure. She has a long-standing history of hypertension for which she takes Cozaar and Coreg. She is at the urologist today to have a cystoscopy to evaluate hematuria. She otherwise feels completely well. She denies chest pain, shortness of breath, abdominal pain, nausea, vomiting, headache, double vision, blurred vision, numbness, weakness. Long-standing history of gastric reflux which is improved when she takes H2 blockers. She is followed by Dr. Doy Hutching.   Past Medical History:  Diagnosis Date  . Cancer ([place])    skin  . Heart murmur   . Heartburn   . HLD (hyperlipidemia)   . HTN (hypertension)   . Hyperthyroidism     There are no active problems to display for this patient.   Past Surgical History:  Procedure Laterality Date  . broken foot    . EXCISIONAL HEMORRHOIDECTOMY    . GALLBLADDER SURGERY    . MEDIAL PARTIAL KNEE REPLACEMENT    . TOTAL HIP ARTHROPLASTY Right    from crushed hip    Prior to Admission medications   Medication Sig Start Date End Date Taking? Authorizing Provider  aspirin EC 81 MG tablet Take by mouth.    Historical Provider, MD  calcium-vitamin D (SM CALCIUM 500/VITAMIN D3) 500-400 MG-UNIT tablet Take by mouth.    Historical Provider, MD  carvedilol (COREG) 6.25 MG tablet Take by mouth. 09/21/16 09/21/17  Historical Provider, MD  DIGESTIVE ENZYMES PO Take by mouth.    Historical Provider, MD  famotidine (PEPCID) 20 MG tablet Take 1 tablet (20 mg total) by mouth 2 (two) times daily. 10/15/16 10/15/17  Darel Hong, MD    hydrochlorothiazide (HYDRODIURIL) 12.5 MG tablet Take 1 tablet (12.5 mg total) by mouth daily. 10/15/16   Darel Hong, MD  LACTOBACILLUS BIFIDUS PO Take by mouth.    Historical Provider, MD  levothyroxine (SYNTHROID, LEVOTHROID) 100 MCG tablet TAKE 1 TABLET (100 MCG TOTAL) BY MOUTH ONCE DAILY. TAKE ON AN EMPTY STOMACH WITH A GLASS OF WATER AT LEAST 30-60 MINUTES BEFORE BREAKFAST. 09/21/16   Historical Provider, MD  losartan (COZAAR) 100 MG tablet Take by mouth. 10/18/15 10/17/16  Historical Provider, MD  ranitidine (ZANTAC) 300 MG tablet Take by mouth. 03/10/16 03/10/17  Historical Provider, MD  solifenacin (VESICARE) 5 MG tablet Take by mouth. 09/07/16 09/07/17  Historical Provider, MD  sucralfate (CARAFATE) 1 g tablet Take by mouth.    Historical Provider, MD  Vitamin D, Ergocalciferol, (DRISDOL) 50000 units CAPS capsule TAKE 1 CAPSULE BY MOUTH ONCE A WEEK 12/23/15   Historical Provider, MD    Allergies Patient has no known allergies.  Family History  Problem Relation Age of Onset  . Cancer Sister 106    stomach  . Prostate cancer Neg Hx   . Kidney cancer Neg Hx   . Bladder Cancer Neg Hx     Social History Social History  Substance Use Topics  . Smoking status: Never Smoker  . Smokeless tobacco: Never Used  . Alcohol use Yes     Comment: occ    Review of Systems Constitutional: No fever/chills Eyes: No visual changes. ENT: No sore throat.  Cardiovascular: Denies chest pain. Respiratory: Denies shortness of breath. Gastrointestinal: No abdominal pain.  No nausea, no vomiting.  No diarrhea.  No constipation. Genitourinary: Negative for dysuria. Musculoskeletal: Negative for back pain. Skin: Negative for rash. Neurological: Negative for headaches, focal weakness or numbness.  10-point ROS otherwise negative.  ____________________________________________   PHYSICAL EXAM:  VITAL SIGNS: ED Triage Vitals [10/15/16 1100]  Enc Vitals Group     BP (!) 195/88     Pulse Rate 64      Resp 20     Temp 99 F (37.2 C)     Temp Source Oral     SpO2 95 %     Weight 150 lb (68 kg)     Height 5\' 3"  (1.6 m)     Head Circumference      Peak Flow      Pain Score      Pain Loc      Pain Edu?      Excl. in [place]?     Constitutional: Alert and oriented x 4 well appearing nontoxic no diaphoresis speaks in full, clear sentencesSmiling laughing very well-appearing Eyes: PERRL EOMI. Head: Atraumatic. Nose: No congestion/rhinnorhea. Mouth/Throat: No trismus Neck: No stridor.   Cardiovascular: Normal rate, regular rhythm.  Respiratory: Normal respiratory effort.  No retractions.  Gastrointestinal: Soft nondistended nontender no peritonitis Musculoskeletal: No lower extremity edema   Neurologic:  Normal speech and language. No gross focal neurologic deficits are appreciated. Skin:  Skin is warm, dry and intact. No rash noted. Psychiatric: Mood and affect are normal. Speech and behavior are normal.  ____________________________________________   LABS (all labs ordered are listed, but only abnormal results are displayed)  Labs Reviewed - No data to display ____________________________________________  EKG   ____________________________________________  RADIOLOGY   ____________________________________________   PROCEDURES  Procedure(s) performed: no  Procedures  Critical Care performed: no  ____________________________________________   INITIAL IMPRESSION / ASSESSMENT AND PLAN / ED COURSE  Pertinent labs & imaging results that were available during my care of the patient were reviewed by me and considered in my medical decision making (see chart for details).  I appreciate the patient's elevated blood pressure, however at this point she is completely asymptomatic. She is not entirely sure what blood pressure medications she is currently taking but on chart review it appears to be Cozaar and Coreg. She has listed previously taking hydrochlorothiazide but she  says she is no longer taking this medication. She is asymp will place her back on her hydrochlorothiazide and refer her back to her primary care physician. According to ACEP clinical guidelines there is no indication for laboratory testing at this time. The patient is discharged home in good condition.     ____________________________________________   FINAL CLINICAL IMPRESSION(S) / ED DIAGNOSES  Final diagnoses:  Hypertension, unspecified type      NEW MEDICATIONS STARTED DURING THIS VISIT:  New Prescriptions   FAMOTIDINE (PEPCID) 20 MG TABLET    Take 1 tablet (20 mg total) by mouth 2 (two) times daily.   HYDROCHLOROTHIAZIDE (HYDRODIURIL) 12.5 MG TABLET    Take 1 tablet (12.5 mg total) by mouth daily.     Note:  This document was prepared using Dragon voice recognition software and may include unintentional dictation errors.     Darel Hong, MD 10/15/16 1139

## 2016-10-15 NOTE — Discharge Instructions (Signed)
Please make a follow-up appointment with Dr. Doy Hutching to recheck your blood pressure. Fortunately today you are doing very well in your blood pressure elevation is likely just an aberration. I am placing him back on hydrochlorothiazide which it looks like he were previously taking. Dr. Doy Hutching may choose to change your medications around when you see him.  Please return to the emergency department for any new or worsening symptoms such as chest pain, shortness of breath, headache, or for any other concerns.

## 2016-10-15 NOTE — Progress Notes (Signed)
Cysto canceled as patient was experiencing indigestion and her SBP >210. Advised patient to go to ED. She voiced an understanding.

## 2016-10-15 NOTE — ED Notes (Signed)
Dr. Mable Paris in room to assess patient.

## 2016-10-15 NOTE — ED Triage Notes (Signed)
Pt in via POV; pt sent over via urologist due to elevated BP.  Pt BP in office over [id number] systolic.  Pt BP 195/88 currently, other vitals WDL.  Pt denies any pain, does report indigestion x one week occurring at night.

## 2016-10-15 NOTE — ED Triage Notes (Signed)
[facility] called and reports sending pt for HTN 209/99 and indigestion on and off for a year. Was at office for cystoscopy.

## 2016-10-20 NOTE — Telephone Encounter (Signed)
Lm for patient to cb to schd appt  Sharyn Lull

## 2016-10-20 NOTE — Telephone Encounter (Signed)
Patient's B/P is still very high and she will see Dr. Doy Hutching on 10-20-16 and per Shannon's advise she needs to get this under control before we consider doing the cysto again. The patient will contact the office once she get's this under control.   Sharyn Lull

## 2016-10-20 NOTE — Telephone Encounter (Signed)
-----   Message from Shannon A McGowan, PA-C sent at 10/20/2016  8:10 AM EST ----- °Patient did not have her cystoscopy as scheduled as she had very high blood pressure on the day of her office visit. Please reschedule the patient's cystoscopy. °

## 2016-10-21 ENCOUNTER — Telehealth: Payer: Self-pay | Admitting: Urology

## 2016-10-21 ENCOUNTER — Other Ambulatory Visit: Payer: Self-pay | Admitting: Internal Medicine

## 2016-10-21 DIAGNOSIS — Z1231 Encounter for screening mammogram for malignant neoplasm of breast: Secondary | ICD-10-CM

## 2016-10-21 DIAGNOSIS — I10 Essential (primary) hypertension: Secondary | ICD-10-CM | POA: Diagnosis not present

## 2016-10-21 NOTE — Telephone Encounter (Signed)
-----   Message from Nori Riis, PA-C sent at 10/20/2016  8:10 AM EST ----- Patient did not have her cystoscopy as scheduled as she had very high blood pressure on the day of her office visit. Please reschedule the patient's cystoscopy.

## 2016-10-21 NOTE — Telephone Encounter (Signed)
Spoke with the patient and her BP is still way to high for her to have the cysto. Spoke with Larene Beach and it is ok to hold off until she see's her PCP and can get this under control. She will call the office once this has been done  [employer]

## 2016-10-22 LAB — SPECIMEN STATUS REPORT

## 2016-10-22 LAB — BUN+CREAT
BUN / CREAT RATIO: 20 (ref 12–28)
BUN: 20 mg/dL (ref 8–27)
CREATININE: 0.99 mg/dL (ref 0.57–1.00)
GFR, EST AFRICAN AMERICAN: 59 mL/min/{1.73_m2} — AB (ref >59)
GFR, EST NON AFRICAN AMERICAN: 51 mL/min/{1.73_m2} — AB (ref >59)

## 2016-10-30 DIAGNOSIS — I10 Essential (primary) hypertension: Secondary | ICD-10-CM | POA: Diagnosis not present

## 2016-11-05 ENCOUNTER — Ambulatory Visit: Payer: PPO | Admitting: Urology

## 2016-11-05 ENCOUNTER — Ambulatory Visit (INDEPENDENT_AMBULATORY_CARE_PROVIDER_SITE_OTHER): Payer: PPO | Admitting: Urology

## 2016-11-05 ENCOUNTER — Encounter: Payer: Self-pay | Admitting: Urology

## 2016-11-05 VITALS — BP 189/69 | HR 60 | Ht 63.5 in | Wt 153.9 lb

## 2016-11-05 DIAGNOSIS — N3946 Mixed incontinence: Secondary | ICD-10-CM | POA: Diagnosis not present

## 2016-11-05 DIAGNOSIS — I10 Essential (primary) hypertension: Secondary | ICD-10-CM | POA: Diagnosis not present

## 2016-11-05 DIAGNOSIS — R3129 Other microscopic hematuria: Secondary | ICD-10-CM | POA: Diagnosis not present

## 2016-11-05 DIAGNOSIS — R351 Nocturia: Secondary | ICD-10-CM | POA: Diagnosis not present

## 2016-11-05 DIAGNOSIS — R32 Unspecified urinary incontinence: Secondary | ICD-10-CM | POA: Diagnosis not present

## 2016-11-05 LAB — BLADDER SCAN AMB NON-IMAGING: Scan Result: 0

## 2016-11-05 NOTE — Progress Notes (Signed)
11/05/2016 1:44 PM   Erica Lane [birth year] [id number]  Referring provider: Idelle Crouch, MD [facility] [place], [place] [address]  Chief Complaint  Patient presents with  . Results    CT    HPI: 83 yo WF who presents today to discuss her CT Urogram report performed for microscopic hematuria.  Background history Patient was referred from their PCP, Dr. Doy Hutching, for microscopic hematuria.  Patient was found to have microscopic hematuria on 3 occasions with 4-10 RBC's/hpf.  Patient does have a prior history of microscopic hematuria and had a hematuria work up in 2009 with Dr. Jacqlyn Larsen.  Findings positive for stable CT from 11/07/2004. There is no evidence of obstructing calcified ureteral stone.  Multiple, stable, hepatic hemangiomas.  Stable, mild, RIGHT UPJ partial obstruction.  Stable, LEFT adnexal cyst.  Stable pancreatic head fullness and lucency. There has been no progression to suggest a progressive malignancy. No biliary obstruction is  noted. The CT is again stable from 10/18/2004.   She states she has a prior history of recurrent urinary tract infections, but there are no documented infections in the chart.   She does have a prior history of nephrolithiasis, trauma to the genitourinary tract or malignancies of the genitourinary tract.  She does not have a family medical history of nephrolithiasis, malignancies of the genitourinary tract or hematuria.   She is having symptoms of urgency, nocturia x 2 and incontinence of stress and urge.  Her UA today demonstrates 3-10 RBC's.  She is not experiencing any suprapubic pain, abdominal pain or flank pain.  She denies any recent fevers, chills, nausea or vomiting.   She has not had any recent imaging studies.   She is not a smoker.  She is not exposed to secondhand smoke.  She has not worked with Sports administrator.   CT Urogram performed on 10/07/2016 noted a small nonobstructing calculus lower pole of the  RIGHT kidney.  Small cortical  hypodense lesions which are too small to characterize and increased in size from comparison exam 2009.  Favor benign renal cysts.  No filling defects within collecting systems or ureters.  No bladder stones or filling defects in the bladder which does not excluded a bladder lesion.  Extensive LEFT colon diverticulosis without diverticulitis.  Benign hemangiomas of the liver and benign-appearing fatty replacement through the pancreatic.  Chronic appearing compression fracture of T11.  I have independently reviewed the films.    She is having nocturia and incontinence.  She states the urine just leaks out.  She states she was on the Vesicare for one month.   She wears pads continually.  She has not had dysuria, gross hematuria or suprapubic pain.  She is wearing POISE pads medium size and she leaks through the pads.  She wears pull ups at night.  Her PVR today is   PMH: Past Medical History:  Diagnosis Date  . Cancer ([place])    skin  . Heart murmur   . Heartburn   . HLD (hyperlipidemia)   . HTN (hypertension)   . Hyperthyroidism     Surgical History: Past Surgical History:  Procedure Laterality Date  . broken foot    . EXCISIONAL HEMORRHOIDECTOMY    . GALLBLADDER SURGERY    . MEDIAL PARTIAL KNEE REPLACEMENT    . TOTAL HIP ARTHROPLASTY Right    from crushed hip    Home Medications:  Allergies as of 11/05/2016   No Known Allergies  Medication List       Accurate as of 11/05/16  1:44 PM. Always use your most recent med list.          aspirin EC 81 MG tablet Take by mouth.   carvedilol 6.25 MG tablet Commonly known as:  COREG Take by mouth.   cloNIDine 0.1 MG tablet Commonly known as:  CATAPRES 1 tablet each AM, 2 tablets each afternoon and 1 tablet at bedtime   DIGESTIVE ENZYMES PO Take by mouth.   famotidine 20 MG tablet Commonly known as:  PEPCID Take 1 tablet (20 mg total) by mouth 2 (two) times daily.   hydrochlorothiazide 12.5 MG  tablet Commonly known as:  HYDRODIURIL Take 1 tablet (12.5 mg total) by mouth daily.   LACTOBACILLUS BIFIDUS PO Take by mouth.   levothyroxine 100 MCG tablet Commonly known as:  SYNTHROID, LEVOTHROID TAKE 1 TABLET (100 MCG TOTAL) BY MOUTH ONCE DAILY. TAKE ON AN EMPTY STOMACH WITH A GLASS OF WATER AT LEAST 30-60 MINUTES BEFORE BREAKFAST.   losartan 100 MG tablet Commonly known as:  COZAAR Take by mouth.   losartan 100 MG tablet Commonly known as:  COZAAR TAKE 1 TABLET (100 MG TOTAL) BY MOUTH ONCE DAILY.   PROBIOTIC PO Take by mouth.   ranitidine 300 MG tablet Commonly known as:  ZANTAC Take by mouth.   SM CALCIUM 500/VITAMIN D3 500-400 MG-UNIT tablet Generic drug:  calcium-vitamin D Take by mouth.   solifenacin 5 MG tablet Commonly known as:  VESICARE Take by mouth.   sucralfate 1 g tablet Commonly known as:  CARAFATE Take by mouth.   Vitamin D (Ergocalciferol) 50000 units Caps capsule Commonly known as:  DRISDOL TAKE 1 CAPSULE BY MOUTH ONCE A WEEK       Allergies: No Known Allergies  Family History: Family History  Problem Relation Age of Onset  . Cancer Sister 83    stomach  . Prostate cancer Neg Hx   . Kidney cancer Neg Hx   . Bladder Cancer Neg Hx     Social History:  reports that she has never smoked. She has never used smokeless tobacco. She reports that she drinks alcohol. She reports that she does not use drugs.  ROS: UROLOGY Frequent Urination?: Yes Hard to postpone urination?: No Burning/pain with urination?: No Get up at night to urinate?: Yes Leakage of urine?: Yes Urine stream starts and stops?: No Trouble starting stream?: No Do you have to strain to urinate?: No Blood in urine?: Yes Urinary tract infection?: No Sexually transmitted disease?: No Injury to kidneys or bladder?: No Painful intercourse?: No Weak stream?: No Currently pregnant?: No Vaginal bleeding?: No Last menstrual period?: n  Gastrointestinal Nausea?:  No Vomiting?: No Indigestion/heartburn?: Yes Diarrhea?: No Constipation?: Yes  Constitutional Fever: No Night sweats?: No Weight loss?: No Fatigue?: Yes  Skin Skin rash/lesions?: No Itching?: No  Eyes Blurred vision?: No Double vision?: No  Ears/Nose/Throat Sore throat?: No Sinus problems?: No  Hematologic/Lymphatic Swollen glands?: No Easy bruising?: No  Cardiovascular Leg swelling?: No Chest pain?: No  Respiratory Cough?: Yes Shortness of breath?: No  Endocrine Excessive thirst?: Yes  Musculoskeletal Back pain?: No Joint pain?: No  Neurological Headaches?: No Dizziness?: Yes  Psychologic Depression?: No Anxiety?: No  Physical Exam: BP (!) 189/69   Pulse 60   Ht 5' 3.5" (1.613 m)   Wt 153 lb 14.4 oz (69.8 kg)   BMI 26.83 kg/m   Constitutional: Well nourished. Alert and oriented, No acute distress. HEENT: [place]  AT, moist mucus membranes. Trachea midline, no masses. Cardiovascular: No clubbing, cyanosis, or edema. Respiratory: Normal respiratory effort, no increased work of breathing. GI: Abdomen is soft, non tender, non distended, no abdominal masses. Liver and spleen not palpable.  No hernias appreciated.  Stool sample for occult testing is not indicated.   GU: No CVA tenderness.  No bladder fullness or masses.   Skin: No rashes, bruises or suspicious lesions. Lymph: No cervical or inguinal adenopathy. Neurologic: Grossly intact, no focal deficits, moving all 4 extremities. Psychiatric: Normal mood and affect.  Laboratory Data: Lab Results  Component Value Date   WBC 8.6 07/09/2013   HGB 13.5 07/09/2013   HCT 38.8 07/09/2013   MCV 91 07/09/2013   PLT 273 07/09/2013    Lab Results  Component Value Date   CREATININE 0.99 09/23/2016     Lab Results  Component Value Date   HGBA1C 5.9 06/09/2013    Lab Results  Component Value Date   TSH 0.527 06/09/2013    Lab Results  Component Value Date   AST 18 07/09/2013   Lab Results   Component Value Date   ALT 17 07/09/2013    Pertinent Imaging CLINICAL DATA:  Microscopic hematuria.  EXAM: CT ABDOMEN AND PELVIS WITHOUT AND WITH CONTRAST  TECHNIQUE: Multidetector CT imaging of the abdomen and pelvis was performed following the standard protocol before and following the bolus administration of intravenous contrast.  CONTRAST:  164mL ISOVUE-300 IOPAMIDOL (ISOVUE-300) INJECTION 61%  COMPARISON:  CT 02/16/2008  FINDINGS: Lower chest: Lung bases are clear.  Hepatobiliary: Lesion within the posterior aspect the LEFT lateral hepatic lobe measures 20 mm (image 10, series 5) is decreased in size from 27 mm on comparison CT 2009. Lesion has peripheral enhancement suggesting hemangioma. Gallbladder is not identified and therefore likely removed surgically. The small enhancing lesion in the more central RIGHT hepatic lobe on image 10 of series 5 which is also decreased size from prior suggesting hemangioma. There is no intrahepatic duct dilatation. Common bile duct measures 7 mm similar to comparison exam.  Pancreas: The head of the pancreas is fatty replaced and mildly enlarged similar to comparison exam measuring 2.8 x 2.9 cm compared to 2.8 x 5 0.9 cm. There is no pancreatic duct dilatation. The body tail the pancreas abnormal pancreatic density.  Spleen: Normal spleen  Adrenals/urinary tract: Adrenal glands are normal. Nonobstructing calculus measuring 6 mm in lower pole of the RIGHT kidney. No ureterolithiasis or obstructive uropathy.  No clear enhancing renal cortical lesion. There is a bilateral small cortical renal lesions measuring 8 mm on the RIGHT (image 32, series 5) and 5 mm on LEFT which are too small to characterize. These lesions are increased in size from 2009.  No filling defects the collecting systems or ureters. 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  No bladder calculi, enhancing bladder lesions, or filling defect within the  bladder.  Stomach/Bowel: Large hiatal hernia. The small bowel and RIGHT colon normal in multiple diverticular the descending colon sigmoid colon.  Vascular/Lymphatic: Abdominal aorta is normal caliber with atherosclerotic calcification. There is no retroperitoneal or periportal lymphadenopathy. No pelvic lymphadenopathy.  Reproductive: Post hysterectomy  Other: No free fluid.  Musculoskeletal: No aggressive osseous lesion. Chronic compression fracture at T11.  IMPRESSION: 1. Small nonobstructing calculus lower pole of the RIGHT kidney. 2. Small cortical hypodense lesions which are too small to characterize and increased in size from comparison exam 2009. Favor benign renal cysts. 3. No filling defects within collecting systems or  ureters. 4. No bladder stones or filling defects in the bladder which does not excluded a bladder lesion. 5. Extensive LEFT colon diverticulosis without diverticulitis. 6. Benign hemangiomas of the liver and benign-appearing fatty replacement through the pancreatic. 7. Chronic appearing compression fracture of T11.   Electronically Signed   By: Suzy Bouchard M.D.   On: 10/07/2016 10:27   Assessment & Plan:     1. Microscopic hematuria  - CT Urogram with no worrisome findings - positive for small nonobstructing calculus lower pole of the RIGHT kidney and small cortical hypodense lesions which are too small to characterize and increased in size from comparison exam 2009. Favor benign renal cysts  - RTC for cystoscopy  2. Mixed incontinence  - wears light pads; not bothersome to the patient at this time  - she is not taking the Vesicare at this time  - will have a trial of Toviaz 8 mg daily  - RTC in 3 weeks for OAB questionnaire and PVR  3. Nocturia  - she is not finding it bothersome at this time  - will continue to monitor   Return for cystoscopy.  These notes generated with voice recognition software. I apologize for  typographical errors.  Erica Lane, [facility] [address] [place], [place] [address] [phone]

## 2016-11-17 ENCOUNTER — Ambulatory Visit (INDEPENDENT_AMBULATORY_CARE_PROVIDER_SITE_OTHER): Payer: PPO | Admitting: Urology

## 2016-11-17 ENCOUNTER — Encounter: Payer: Self-pay | Admitting: Urology

## 2016-11-17 VITALS — BP 163/69 | HR 54 | Ht 63.5 in | Wt 149.3 lb

## 2016-11-17 DIAGNOSIS — R3129 Other microscopic hematuria: Secondary | ICD-10-CM | POA: Diagnosis not present

## 2016-11-17 DIAGNOSIS — N3946 Mixed incontinence: Secondary | ICD-10-CM

## 2016-11-17 DIAGNOSIS — N200 Calculus of kidney: Secondary | ICD-10-CM

## 2016-11-17 LAB — URINALYSIS, COMPLETE
BILIRUBIN UA: NEGATIVE
Glucose, UA: NEGATIVE
Ketones, UA: NEGATIVE
Leukocytes, UA: NEGATIVE
Nitrite, UA: NEGATIVE
PH UA: 7 (ref 5.0–7.5)
Protein, UA: NEGATIVE
Specific Gravity, UA: 1.015 (ref 1.005–1.030)
UUROB: 0.2 mg/dL (ref 0.2–1.0)

## 2016-11-17 MED ORDER — LIDOCAINE HCL 2 % EX GEL
1.0000 "application " | Freq: Once | CUTANEOUS | Status: AC
  Administered 2016-11-17: 1 via URETHRAL

## 2016-11-17 MED ORDER — CIPROFLOXACIN HCL 500 MG PO TABS
500.0000 mg | ORAL_TABLET | Freq: Once | ORAL | Status: AC
  Administered 2016-11-17: 500 mg via ORAL

## 2016-11-17 NOTE — Addendum Note (Signed)
Addended by: Kyra Manges on: 11/17/2016 03:46 PM   Modules accepted: Orders

## 2016-11-17 NOTE — Progress Notes (Signed)
   11/17/16  CC:  Chief Complaint  Patient presents with  . Cysto    HPI:  The patient is a 83 year old female who presents today for completion of her microscopic hematuria workup. Her CT urogram was negative for source of hematuria except for a 6 mm right lower pole stone.  The patient was also recently started on Toviaz 8 mg daily for her mixed urinary incontinence. She is followed by Jimmye Norman, PA for this. The plan has been for her to follow up in a few weeks for OAB questionnaire and PVR. She started this medication only a few days ago.  There were no vitals taken for this visit. NED. A&Ox3.   No respiratory distress   Abd soft, NT, ND Normal external genitalia with patent urethral meatus  Cystoscopy Procedure Note  Patient identification was confirmed, informed consent was obtained, and patient was prepped using Betadine solution.  Lidocaine jelly was administered per urethral meatus.    Preoperative abx where received prior to procedure.    Procedure: - Flexible cystoscope introduced, without any difficulty.   - Thorough search of the bladder revealed:    normal urethral meatus    normal urothelium    no stones    no ulcers     no tumors    no urethral polyps    no trabeculation  - Ureteral orifices were normal in position and appearance.  Post-Procedure: - Patient tolerated the procedure well  Assessment/ Plan:  1. Microscopic hematuria -negative workup. Repeat urinalysis in one year  2. Mixed urinary incontinence -Continue Toviaz 8 mg daily for now. If this fails, could try Myrbetriq followed by perhaps PTNS -Follow up with the Zara Council, PA in a few weeks for OAB questionnaire and PVR as previously planned.   3. Right lower pole stone - 70mm -Repeat KUB in 6-12 months to ensure stone remains stable in size.   Nickie Retort, MD

## 2016-11-24 DIAGNOSIS — K210 Gastro-esophageal reflux disease with esophagitis: Secondary | ICD-10-CM | POA: Diagnosis not present

## 2016-11-24 DIAGNOSIS — I6523 Occlusion and stenosis of bilateral carotid arteries: Secondary | ICD-10-CM | POA: Diagnosis not present

## 2016-11-24 DIAGNOSIS — I119 Hypertensive heart disease without heart failure: Secondary | ICD-10-CM | POA: Diagnosis not present

## 2016-11-24 DIAGNOSIS — I10 Essential (primary) hypertension: Secondary | ICD-10-CM | POA: Diagnosis not present

## 2016-11-26 LAB — URINALYSIS, COMPLETE
Bilirubin, UA: NEGATIVE
Glucose, UA: NEGATIVE
Ketones, UA: NEGATIVE
Leukocytes, UA: NEGATIVE
Nitrite, UA: NEGATIVE
PH UA: 7 (ref 5.0–7.5)
Protein, UA: NEGATIVE
Specific Gravity, UA: 1.015 (ref 1.005–1.030)
Urobilinogen, Ur: 0.2 mg/dL (ref 0.2–1.0)

## 2016-11-26 LAB — MICROSCOPIC EXAMINATION: Renal Epithel, UA: NONE SEEN /hpf

## 2016-12-04 DIAGNOSIS — K210 Gastro-esophageal reflux disease with esophagitis: Secondary | ICD-10-CM | POA: Diagnosis not present

## 2016-12-04 DIAGNOSIS — I6523 Occlusion and stenosis of bilateral carotid arteries: Secondary | ICD-10-CM | POA: Diagnosis not present

## 2016-12-04 DIAGNOSIS — I10 Essential (primary) hypertension: Secondary | ICD-10-CM | POA: Diagnosis not present

## 2016-12-09 NOTE — Progress Notes (Signed)
12/10/2016 9:10 AM   Erica Lane [birth year] [id number]  Referring provider: Idelle Crouch, MD [facility] [place], [place] [address]  Chief Complaint  Patient presents with  . Follow-up    3 weeks follow up urinary incontinence    HPI: 83 yo WF with a history of hematuria, nocturia and mixed incontinence who presents today for a three week follow up after starting [place]anna for her mixed incontinence.    Background history Patient completed hematuria workup on 11/17/2016.  CTU and cystoscopy did not identify any worrisome findings, but she did have a small right lower pole   She is having nocturia and incontinence.  She states the urine just leaks out.  She states she was on the Vesicare for one month and did not find it effective.  She wears pads continually.  She has not had dysuria, gross hematuria or suprapubic pain.  She is wearing POISE pads medium size and she leaks through the pads.  She wears pull ups at night.    Today, the patient has been experiencing urgency x 0-3, frequency x 4-7, not restricting fluids to avoid visits to the restroom, not engaging in toilet mapping, incontinence x 4-7 and nocturia x 0-3.  Her PVR was 20 mL.     PMH: Past Medical History:  Diagnosis Date  . Cancer ([place])    skin  . Heart murmur   . Heartburn   . HLD (hyperlipidemia)   . HTN (hypertension)   . Hyperthyroidism     Surgical History: Past Surgical History:  Procedure Laterality Date  . broken foot    . EXCISIONAL HEMORRHOIDECTOMY    . GALLBLADDER SURGERY    . MEDIAL PARTIAL KNEE REPLACEMENT    . TOTAL HIP ARTHROPLASTY Right    from crushed hip    Home Medications:  Allergies as of 12/10/2016   No Known Allergies     Medication List       Accurate as of 12/10/16  9:10 AM. Always use your most recent med list.          aspirin EC 81 MG tablet Take by mouth.   carvedilol 6.25 MG tablet Commonly known as:  COREG Take by mouth.     cloNIDine 0.1 MG tablet Commonly known as:  CATAPRES 1 tablet each AM, 2 tablets each afternoon and 1 tablet at bedtime   DIGESTIVE ENZYMES PO Take by mouth.   famotidine 20 MG tablet Commonly known as:  PEPCID Take 1 tablet (20 mg total) by mouth 2 (two) times daily.   fesoterodine 8 MG Tb24 tablet Commonly known as:  TOVIAZ Take by mouth.   hydrochlorothiazide 12.5 MG tablet Commonly known as:  HYDRODIURIL Take 1 tablet (12.5 mg total) by mouth daily.   LACTOBACILLUS BIFIDUS PO Take by mouth.   levothyroxine 100 MCG tablet Commonly known as:  SYNTHROID, LEVOTHROID TAKE 1 TABLET (100 MCG TOTAL) BY MOUTH ONCE DAILY. TAKE ON AN EMPTY STOMACH WITH A GLASS OF WATER AT LEAST 30-60 MINUTES BEFORE BREAKFAST.   losartan 100 MG tablet Commonly known as:  COZAAR Take by mouth.   losartan 100 MG tablet Commonly known as:  COZAAR TAKE 1 TABLET (100 MG TOTAL) BY MOUTH ONCE DAILY.   PROBIOTIC PO Take by mouth.   PROBIOTIC-10 Caps Take by mouth.   ranitidine 300 MG tablet Commonly known as:  ZANTAC Take by mouth.   SM CALCIUM 500/VITAMIN D3 500-400 MG-UNIT tablet Generic drug:  calcium-vitamin  D Take by mouth.   solifenacin 5 MG tablet Commonly known as:  VESICARE Take by mouth.   sucralfate 1 g tablet Commonly known as:  CARAFATE Take by mouth.   telmisartan 80 MG tablet Commonly known as:  MICARDIS Take by mouth.   Vitamin D (Ergocalciferol) 50000 units Caps capsule Commonly known as:  DRISDOL TAKE 1 CAPSULE BY MOUTH ONCE A WEEK       Allergies: No Known Allergies  Family History: Family History  Problem Relation Age of Onset  . Cancer Sister 54    stomach  . Prostate cancer Brother   . Prostate cancer Brother   . Kidney cancer Neg Hx   . Bladder Cancer Neg Hx     Social History:  reports that she has never smoked. She has never used smokeless tobacco. She reports that she drinks alcohol. She reports that she does not use  drugs.  ROS: UROLOGY Frequent Urination?: Yes Hard to postpone urination?: No Burning/pain with urination?: No Get up at night to urinate?: No Leakage of urine?: Yes Urine stream starts and stops?: No Trouble starting stream?: No Do you have to strain to urinate?: No Blood in urine?: No Urinary tract infection?: No Sexually transmitted disease?: No Injury to kidneys or bladder?: No Painful intercourse?: No Weak stream?: No Currently pregnant?: No Vaginal bleeding?: No Last menstrual period?: n  Gastrointestinal Nausea?: No Vomiting?: No Indigestion/heartburn?: No Diarrhea?: No Constipation?: No  Constitutional Fever: No Night sweats?: No Weight loss?: No Fatigue?: Yes  Skin Skin rash/lesions?: No Itching?: No  Eyes Blurred vision?: No Double vision?: No  Ears/Nose/Throat Sore throat?: No Sinus problems?: No  Hematologic/Lymphatic Swollen glands?: No Easy bruising?: No  Cardiovascular Leg swelling?: No Chest pain?: No  Respiratory Cough?: Yes Shortness of breath?: No  Endocrine Excessive thirst?: No  Musculoskeletal Back pain?: No Joint pain?: No  Neurological Headaches?: No Dizziness?: No  Psychologic Depression?: No Anxiety?: No  Physical Exam: BP 129/71   Pulse (!) 59   Ht 5' (1.524 m)   Wt 149 lb (67.6 kg)   BMI 29.10 kg/m   Constitutional: Well nourished. Alert and oriented, No acute distress. HEENT: [place] AT, moist mucus membranes. Trachea midline, no masses. Cardiovascular: No clubbing, cyanosis, or edema. Respiratory: Normal respiratory effort, no increased work of breathing. GI: Abdomen is soft, non tender, non distended, no abdominal masses. Liver and spleen not palpable.  No hernias appreciated.  Stool sample for occult testing is not indicated.   GU: No CVA tenderness.  No bladder fullness or masses.  Atrophic external genitalia, normal pubic hair distribution, no lesions.  Normal urethral meatus, no lesions, no prolapse,  no discharge.   Urethral caruncle.  No bladder fullness, tenderness or masses. Normal vagina mucosa, good estrogen effect, no discharge, no lesions, good pelvic support, Grade I cystocele is noted.   No rectocele noted.  Cervix, uterus and adnexa are surgically absent.   Anus and perineum are without rashes or lesions.    Skin: No rashes, bruises or suspicious lesions. Lymph: No cervical or inguinal adenopathy. Neurologic: Grossly intact, no focal deficits, moving all 4 extremities. Psychiatric: Normal mood and affect.  Laboratory Data: Lab Results  Component Value Date   WBC 8.6 07/09/2013   HGB 13.5 07/09/2013   HCT 38.8 07/09/2013   MCV 91 07/09/2013   PLT 273 07/09/2013    Lab Results  Component Value Date   CREATININE 0.99 09/23/2016     Lab Results  Component Value Date  HGBA1C 5.9 06/09/2013    Lab Results  Component Value Date   TSH 0.527 06/09/2013    Lab Results  Component Value Date   AST 18 07/09/2013   Lab Results  Component Value Date   ALT 17 07/09/2013    Pertinent imaging Results for Erica Lane, Erica Lane (MRN [id number]) as of 12/10/2016 08:53  Ref. Range 12/10/2016 08:46  Scan Result Unknown 20    Assessment & Plan:     1. Microscopic hematuria  - hematuria work up completed 11/2016 with CTU and cystoscopy - no worrisome findings   - RTC in one year for UA (11/2017)  - report any gross hematuria  2. Right renal stone  - KUB in one year (11/2017)  - Advised to contact our office or seek treatment in the ED if becomes febrile or pain/ vomiting are difficult control in order to arrange for emergent/urgent intervention  3. Mixed incontinence  - some benefit with Toviaz 8 mg daily - discontinue Toviaz  - BP is now under control and will have a trial of Myrbetriq 25 mg daily  - offered behavioral therapies, bladder training, bladder control strategies and pelvic floor muscle training - pt deferred PT  - fluid management   - offered refer to  gynecology for a pessary fitting - patient would like a referral  - RTC in 3 weeks for PVR and symptom recheck   4. Nocturia  - she is starting to wear pads at night now  - RTC in 3 weeks for PVR and OAB questionnaire.   Return in about 3 weeks (around 12/31/2016) for PVR and OAB questionnaire.  These notes generated with voice recognition software. I apologize for typographical errors.  Zara Council, [facility] [address], [place], [place] [address] [phone]

## 2016-12-10 ENCOUNTER — Encounter: Payer: Self-pay | Admitting: Urology

## 2016-12-10 ENCOUNTER — Ambulatory Visit: Payer: PPO | Admitting: Urology

## 2016-12-10 VITALS — BP 129/71 | HR 59 | Ht 60.0 in | Wt 149.0 lb

## 2016-12-10 DIAGNOSIS — N200 Calculus of kidney: Secondary | ICD-10-CM | POA: Diagnosis not present

## 2016-12-10 DIAGNOSIS — R32 Unspecified urinary incontinence: Secondary | ICD-10-CM | POA: Diagnosis not present

## 2016-12-10 DIAGNOSIS — N3946 Mixed incontinence: Secondary | ICD-10-CM

## 2016-12-10 DIAGNOSIS — R3129 Other microscopic hematuria: Secondary | ICD-10-CM | POA: Diagnosis not present

## 2016-12-10 DIAGNOSIS — R351 Nocturia: Secondary | ICD-10-CM | POA: Diagnosis not present

## 2016-12-10 LAB — BLADDER SCAN AMB NON-IMAGING: Scan Result: 20

## 2016-12-10 NOTE — Patient Instructions (Signed)
PESSARY

## 2016-12-18 DIAGNOSIS — E782 Mixed hyperlipidemia: Secondary | ICD-10-CM | POA: Diagnosis not present

## 2016-12-18 DIAGNOSIS — E039 Hypothyroidism, unspecified: Secondary | ICD-10-CM | POA: Diagnosis not present

## 2016-12-18 DIAGNOSIS — Z79899 Other long term (current) drug therapy: Secondary | ICD-10-CM | POA: Diagnosis not present

## 2016-12-18 DIAGNOSIS — I10 Essential (primary) hypertension: Secondary | ICD-10-CM | POA: Diagnosis not present

## 2016-12-18 DIAGNOSIS — N393 Stress incontinence (female) (male): Secondary | ICD-10-CM | POA: Diagnosis not present

## 2016-12-18 DIAGNOSIS — R102 Pelvic and perineal pain: Secondary | ICD-10-CM | POA: Diagnosis not present

## 2016-12-18 DIAGNOSIS — E119 Type 2 diabetes mellitus without complications: Secondary | ICD-10-CM | POA: Diagnosis not present

## 2016-12-21 ENCOUNTER — Ambulatory Visit: Payer: Medicare Other

## 2016-12-23 ENCOUNTER — Ambulatory Visit
Admission: RE | Admit: 2016-12-23 | Discharge: 2016-12-23 | Disposition: A | Discharge status: Home / Self Care | Payer: PPO | Source: Ambulatory Visit | Attending: Internal Medicine | Admitting: Internal Medicine

## 2016-12-23 DIAGNOSIS — Z1231 Encounter for screening mammogram for malignant neoplasm of breast: Secondary | ICD-10-CM | POA: Diagnosis not present

## 2016-12-28 ENCOUNTER — Other Ambulatory Visit: Payer: Self-pay | Admitting: Internal Medicine

## 2016-12-28 DIAGNOSIS — R928 Other abnormal and inconclusive findings on diagnostic imaging of breast: Secondary | ICD-10-CM

## 2016-12-28 DIAGNOSIS — N6489 Other specified disorders of breast: Secondary | ICD-10-CM

## 2016-12-28 NOTE — Progress Notes (Signed)
12/31/2016 11:20 AM   Erica Lane [birth year] [id number]  Referring provider: Idelle Crouch, MD [facility] [place], [place] [address]  Chief Complaint  Patient presents with  . Urinary Incontinence    3 week follow up  Mixed  . Hematuria    micro    HPI: 83 yo WF with a history of hematuria, nocturia and mixed incontinence who presents today for a three week follow up after starting Myrbetriq 25 mg daily for her mixed incontinence.    Background history Patient completed hematuria workup on 11/17/2016.  CTU and cystoscopy did not identify any worrisome findings, but she did have a small right lower pole stone.   She is having nocturia and incontinence.  She states the urine just leaks out.  She states she was on the Vesicare for one month and did not find it effective.  She wears pads continually.  She has not had dysuria, gross hematuria or suprapubic pain.  She is wearing POISE pads medium size and she leaks through the pads.  She wears pull ups at night.    Today, the patient has been experiencing urgency x 0-3, frequency x 4-7, not restricting fluids to avoid visits to the restroom, not engaging in toilet mapping, incontinence x 4-7 and nocturia x 0-3.  Her PVR was 20 mL.     The patient has been experiencing urgency, she states that she is wearing a pad most of the time, frequency x 4-7 (stable), not restricting fluids to avoid visits to the restroom, is engaging in toilet mapping, incontinence x 4-7 (stable) and nocturia x 3 (stable).  Her PVR is 0 mL.    PMH: Past Medical History:  Diagnosis Date  . Cancer ([place])    skin  . Heart murmur   . Heartburn   . HLD (hyperlipidemia)   . HTN (hypertension)   . Hyperthyroidism     Surgical History: Past Surgical History:  Procedure Laterality Date  . broken foot    . EXCISIONAL HEMORRHOIDECTOMY    . GALLBLADDER SURGERY    . MEDIAL PARTIAL KNEE REPLACEMENT    . TOTAL HIP ARTHROPLASTY  Right    from crushed hip    Home Medications:  Allergies as of 12/31/2016   No Known Allergies     Medication List       Accurate as of 12/31/16 11:20 AM. Always use your most recent med list.          aspirin EC 81 MG tablet Take by mouth.   carvedilol 6.25 MG tablet Commonly known as:  COREG Take by mouth.   cloNIDine 0.1 MG tablet Commonly known as:  CATAPRES 1 tablet each AM, 2 tablets each afternoon and 1 tablet at bedtime   DIGESTIVE ENZYMES PO Take by mouth.   famotidine 20 MG tablet Commonly known as:  PEPCID Take 1 tablet (20 mg total) by mouth 2 (two) times daily.   fesoterodine 8 MG Tb24 tablet Commonly known as:  TOVIAZ Take by mouth.   hydrochlorothiazide 12.5 MG tablet Commonly known as:  HYDRODIURIL Take 1 tablet (12.5 mg total) by mouth daily.   LACTOBACILLUS BIFIDUS PO Take by mouth.   levothyroxine 100 MCG tablet Commonly known as:  SYNTHROID, LEVOTHROID TAKE 1 TABLET (100 MCG TOTAL) BY MOUTH ONCE DAILY. TAKE ON AN EMPTY STOMACH WITH A GLASS OF WATER AT LEAST 30-60 MINUTES BEFORE BREAKFAST.   losartan 100 MG tablet Commonly known as:  COZAAR Take  by mouth.   losartan 100 MG tablet Commonly known as:  COZAAR TAKE 1 TABLET (100 MG TOTAL) BY MOUTH ONCE DAILY.   PROBIOTIC PO Take by mouth.   PROBIOTIC-10 Caps Take by mouth.   ranitidine 300 MG tablet Commonly known as:  ZANTAC Take by mouth.   SM CALCIUM 500/VITAMIN D3 500-400 MG-UNIT tablet Generic drug:  calcium-vitamin D Take by mouth.   solifenacin 5 MG tablet Commonly known as:  VESICARE Take by mouth.   sucralfate 1 g tablet Commonly known as:  CARAFATE Take by mouth.   telmisartan 80 MG tablet Commonly known as:  MICARDIS Take by mouth.   Vitamin D (Ergocalciferol) 50000 units Caps capsule Commonly known as:  DRISDOL TAKE 1 CAPSULE BY MOUTH ONCE A WEEK       Allergies: No Known Allergies  Family History: Family History  Problem Relation Age of Onset  .  Cancer Sister 107       stomach  . Prostate cancer Brother   . Prostate cancer Brother   . Kidney cancer Neg Hx   . Bladder Cancer Neg Hx     Social History:  reports that she has never smoked. She has never used smokeless tobacco. She reports that she drinks alcohol. She reports that she does not use drugs.  ROS: UROLOGY Frequent Urination?: No Hard to postpone urination?: No Burning/pain with urination?: No Get up at night to urinate?: No Leakage of urine?: No Urine stream starts and stops?: No Trouble starting stream?: No Do you have to strain to urinate?: No Blood in urine?: No Urinary tract infection?: No Sexually transmitted disease?: No Injury to kidneys or bladder?: No Painful intercourse?: No Weak stream?: No Currently pregnant?: No Vaginal bleeding?: No Last menstrual period?: n  Gastrointestinal Nausea?: No Vomiting?: No Indigestion/heartburn?: No Diarrhea?: No Constipation?: No  Constitutional Fever: No Night sweats?: No Weight loss?: No Fatigue?: No  Skin Skin rash/lesions?: No Itching?: No  Eyes Blurred vision?: No Double vision?: No  Ears/Nose/Throat Sore throat?: No Sinus problems?: No  Hematologic/Lymphatic Swollen glands?: No Easy bruising?: No  Cardiovascular Leg swelling?: No Chest pain?: No  Respiratory Cough?: Yes Shortness of breath?: No  Endocrine Excessive thirst?: No  Musculoskeletal Back pain?: No Joint pain?: No  Neurological Headaches?: No Dizziness?: No  Psychologic Depression?: No Anxiety?: No  Physical Exam: BP (!) 150/73   Pulse (!) 59   Ht 5' 3.5" (1.613 m)   Wt 150 lb (68 kg)   BMI 26.15 kg/m   Constitutional: Well nourished. Alert and oriented, No acute distress. HEENT:  AT, moist mucus membranes. Trachea midline, no masses. Cardiovascular: No clubbing, cyanosis, or edema. Respiratory: Normal respiratory effort, no increased work of breathing. Skin: No rashes, bruises or suspicious  lesions. Lymph: No cervical or inguinal adenopathy. Neurologic: Grossly intact, no focal deficits, moving all 4 extremities. Psychiatric: Normal mood and affect.  Laboratory Data: Lab Results  Component Value Date   WBC 8.6 07/09/2013   HGB 13.5 07/09/2013   HCT 38.8 07/09/2013   MCV 91 07/09/2013   PLT 273 07/09/2013    Lab Results  Component Value Date   CREATININE 0.99 09/23/2016     Lab Results  Component Value Date   HGBA1C 5.9 06/09/2013    Lab Results  Component Value Date   TSH 0.527 06/09/2013    Lab Results  Component Value Date   AST 18 07/09/2013   Lab Results  Component Value Date   ALT 17 07/09/2013  Pertinent imaging Results for Erica Lane, Erica Lane (MRN [id number]) as of 12/31/2016 11:03  Ref. Range 12/31/2016 10:52  Scan Result Unknown 0     Assessment & Plan:     1. Mixed incontinence  - failed both Toviaz and Myrbetriq   - BP is now under control and will have a trial of Myrbetriq 25 mg daily  - offered behavioral therapies, bladder training, bladder control strategies and pelvic floor muscle training - pt deferred PT  - fluid management   - offered refer to gynecology for a pessary fitting - patient would like a referral  - RTC for PTNS  2. Microscopic hematuria  - hematuria work up completed 11/2016 with CTU and cystoscopy - no worrisome findings   - RTC in one year for UA (11/2017)  - report any gross hematuria  3. Right renal stone  - KUB in one year (11/2017)  - Advised to contact our office or seek treatment in the ED if becomes febrile or pain/ vomiting are difficult control in order to arrange for emergent/urgent intervention    4. Nocturia  - she is starting to wear pads at night now  - RTC for PTNS   Return for patient will follow up for PTNS.  These notes generated with voice recognition software. I apologize for typographical errors.  Zara Council, [facility] [address],  [place], [place] [address] [phone]

## 2016-12-31 ENCOUNTER — Ambulatory Visit
Admission: RE | Admit: 2016-12-31 | Discharge: 2016-12-31 | Disposition: A | Discharge status: Home / Self Care | Payer: PPO | Source: Ambulatory Visit | Attending: Internal Medicine | Admitting: Internal Medicine

## 2016-12-31 ENCOUNTER — Encounter: Payer: Self-pay | Admitting: Urology

## 2016-12-31 ENCOUNTER — Ambulatory Visit: Payer: PPO | Admitting: Urology

## 2016-12-31 VITALS — BP 150/73 | HR 59 | Ht 63.5 in | Wt 150.0 lb

## 2016-12-31 DIAGNOSIS — R351 Nocturia: Secondary | ICD-10-CM

## 2016-12-31 DIAGNOSIS — R928 Other abnormal and inconclusive findings on diagnostic imaging of breast: Secondary | ICD-10-CM

## 2016-12-31 DIAGNOSIS — N3946 Mixed incontinence: Secondary | ICD-10-CM

## 2016-12-31 DIAGNOSIS — N6489 Other specified disorders of breast: Secondary | ICD-10-CM

## 2016-12-31 DIAGNOSIS — N6310 Unspecified lump in the right breast, unspecified quadrant: Secondary | ICD-10-CM | POA: Diagnosis not present

## 2016-12-31 LAB — BLADDER SCAN AMB NON-IMAGING: SCAN RESULT: 0

## 2017-01-04 ENCOUNTER — Other Ambulatory Visit: Payer: Self-pay | Admitting: Internal Medicine

## 2017-01-04 ENCOUNTER — Telehealth: Payer: Self-pay | Admitting: *Deleted

## 2017-01-04 DIAGNOSIS — N63 Unspecified lump in unspecified breast: Secondary | ICD-10-CM

## 2017-01-04 NOTE — Telephone Encounter (Signed)
Spoke with Patient regarding PTNS procedure spoke with Judeen Hammans at patient insurance company and she stated no prior authorization necessary. Patient will start procedure on May 30th at 9:30 am and on wednesdays at 9:30 am for twelve weeks. Patient ok with the plan.

## 2017-01-05 ENCOUNTER — Ambulatory Visit: Payer: Medicare Other

## 2017-01-05 ENCOUNTER — Other Ambulatory Visit: Payer: Medicare Other

## 2017-01-07 DIAGNOSIS — I10 Essential (primary) hypertension: Secondary | ICD-10-CM | POA: Diagnosis not present

## 2017-01-12 ENCOUNTER — Encounter: Payer: Self-pay | Admitting: Obstetrics and Gynecology

## 2017-01-12 ENCOUNTER — Ambulatory Visit (INDEPENDENT_AMBULATORY_CARE_PROVIDER_SITE_OTHER): Payer: PPO | Admitting: Obstetrics and Gynecology

## 2017-01-12 VITALS — BP 185/72 | HR 62 | Ht 63.5 in | Wt 150.0 lb

## 2017-01-12 DIAGNOSIS — N952 Postmenopausal atrophic vaginitis: Secondary | ICD-10-CM | POA: Diagnosis not present

## 2017-01-12 DIAGNOSIS — N3946 Mixed incontinence: Secondary | ICD-10-CM

## 2017-01-12 MED ORDER — ESTROGENS, CONJUGATED 0.625 MG/GM VA CREA: 0.5000 | TOPICAL_CREAM | VAGINAL | 12 refills | Status: DC

## 2017-01-12 NOTE — Patient Instructions (Addendum)
1. Pessary could not be completed today due to stenosis of the introitus and vaginal atrophy 2. Recommend poise impressa inserts. 3. Recommend follow-up with urology regarding urge symptoms, Botox. 4. Begin estrogen (Premarin) cream intravaginal 1/2 g twice a week 5. Return in 3 months for follow-up

## 2017-01-12 NOTE — Progress Notes (Signed)
01/13/2017 10:10 AM   Erica Lane 03-15-1928 [id number]  Referring provider: Idelle Crouch, MD [facility] [place], [place] [address]  Chief Complaint  Patient presents with  . Urinary Incontinence    PTNS #1    HPI: 83 yo WF with a history of hematuria, nocturia and mixed incontinence who presents to initiate PTNS treatment. This will be #1/12 weekly treatments.    Background history Patient completed hematuria workup on 11/17/2016.  CTU and cystoscopy did not identify any worrisome findings, but she did have a small right lower pole stone.   She is having nocturia and incontinence.  She states the urine just leaks out.  She states she was on the Vesicare for one month and did not find it effective.  She wears pads continually.  She has not had dysuria, gross hematuria or suprapubic pain.  She is wearing POISE pads medium size and she leaks through the pads.  She wears pull ups at night.    She also had a trial of Toviaz and Myrbetriq, both medication she found it ineffective.  Contraindications present for PTNS      Pacemaker      Implantable defibrillator      History of abnormal bleeding      History of neuropathies or nerve damage  Discussed with patient possible complications of procedure, such as discomfort, bleeding at insertion/stimulation site, procedure consent signed  Patient goals:     - patient is wanting to see a reduction in her frequency and nocturia.   Baseline symptoms are urgency, she states that she is wearing a pad most of the time, frequency x 4-7 (stable), not restricting fluids to avoid visits to the restroom, is engaging in toilet mapping, incontinence x 4-7 (stable) and nocturia x 3 (stable).  Her PVR is 0 mL.    She was seen and I waited by Dr. Enzo Bi for pessary fitting. Unfortunately, a pessary could not be fitted due to her narrow introitus. She is advised to try the over-the-counter POISE pessary area  or tampon  Over the last week, she is experiencing 5-6 daytime voids, 2 nighttime voids, mild urgency and 2 incontinence episodes daily.  She is not experiencing dysuria, gross hematuria suprapubic pain.  She is not experiencing fever chills nausea or vomiting    PMH: Past Medical History:  Diagnosis Date  . Cancer ([place])    skin  . Heart murmur   . Heartburn   . HLD (hyperlipidemia)   . HTN (hypertension)   . Hyperthyroidism     Surgical History: Past Surgical History:  Procedure Laterality Date  . broken foot    . EXCISIONAL HEMORRHOIDECTOMY    . GALLBLADDER SURGERY    . MEDIAL PARTIAL KNEE REPLACEMENT    . TOTAL HIP ARTHROPLASTY Right    from crushed hip  . VAGINAL HYSTERECTOMY  1961    Home Medications:  Allergies as of 01/13/2017   No Known Allergies     Medication List       Accurate as of 01/13/17 10:10 AM. Always use your most recent med list.          aspirin EC 81 MG tablet Take by mouth.   carvedilol 6.25 MG tablet Commonly known as:  COREG Take by mouth.   cloNIDine 0.1 MG tablet Commonly known as:  CATAPRES 1 tablet each AM, 2 tablets each afternoon and 1 tablet at bedtime   conjugated estrogens vaginal cream Commonly known  as:  PREMARIN Place 0.5 Applicatorfuls vaginally 2 (two) times a week. Start taking on:  01/14/2017   DIGESTIVE ENZYMES PO Take by mouth.   fesoterodine 8 MG Tb24 tablet Commonly known as:  TOVIAZ Take by mouth.   LACTOBACILLUS BIFIDUS PO Take by mouth.   levothyroxine 100 MCG tablet Commonly known as:  SYNTHROID, LEVOTHROID TAKE 1 TABLET (100 MCG TOTAL) BY MOUTH ONCE DAILY. TAKE ON AN EMPTY STOMACH WITH A GLASS OF WATER AT LEAST 30-60 MINUTES BEFORE BREAKFAST.   pantoprazole 40 MG tablet Commonly known as:  PROTONIX Take 40 mg by mouth daily.   PROBIOTIC PO Take by mouth.   ranitidine 300 MG tablet Commonly known as:  ZANTAC Take by mouth.   SM CALCIUM 500/VITAMIN D3 500-400 MG-UNIT tablet Generic drug:   calcium-vitamin D Take by mouth.   solifenacin 5 MG tablet Commonly known as:  VESICARE Take by mouth.   sucralfate 1 g tablet Commonly known as:  CARAFATE Take by mouth.   telmisartan 80 MG tablet Commonly known as:  MICARDIS Take by mouth.   Vitamin D (Ergocalciferol) 50000 units Caps capsule Commonly known as:  DRISDOL TAKE 1 CAPSULE BY MOUTH ONCE A WEEK       Allergies: No Known Allergies  Family History: Family History  Problem Relation Age of Onset  . Cancer Sister 67       stomach  . Prostate cancer Brother   . Prostate cancer Brother   . Kidney cancer Neg Hx   . Bladder Cancer Neg Hx     Social History:  reports that she has never smoked. She has never used smokeless tobacco. She reports that she drinks alcohol. She reports that she does not use drugs.  ROS: UROLOGY Frequent Urination?: No Hard to postpone urination?: No Burning/pain with urination?: No Get up at night to urinate?: No Leakage of urine?: Yes Urine stream starts and stops?: No Trouble starting stream?: No Do you have to strain to urinate?: No Blood in urine?: No Urinary tract infection?: No Sexually transmitted disease?: No Injury to kidneys or bladder?: No Painful intercourse?: No Weak stream?: No Currently pregnant?: No Vaginal bleeding?: No Last menstrual period?: n  Gastrointestinal Nausea?: No Vomiting?: No Indigestion/heartburn?: No Diarrhea?: No Constipation?: No  Constitutional Fever: No Night sweats?: No Weight loss?: No Fatigue?: No  Skin Skin rash/lesions?: No Itching?: No  Eyes Blurred vision?: No Double vision?: No  Ears/Nose/Throat Sore throat?: No Sinus problems?: No  Hematologic/Lymphatic Swollen glands?: No Easy bruising?: No  Cardiovascular Leg swelling?: No Chest pain?: No  Respiratory Cough?: No Shortness of breath?: No  Endocrine Excessive thirst?: No  Musculoskeletal Back pain?: No Joint pain?:  No  Neurological Headaches?: No Dizziness?: No  Psychologic Depression?: No Anxiety?: No  Physical Exam: BP (!) 171/69   Pulse 64   Ht 5\' 3"  (1.6 m)   Wt 150 lb (68 kg)   BMI 26.57 kg/m   Constitutional: Well nourished. Alert and oriented, No acute distress. HEENT: [place] AT, moist mucus membranes. Trachea midline, no masses. Cardiovascular: No clubbing, cyanosis, or edema. Respiratory: Normal respiratory effort, no increased work of breathing. Skin: No rashes, bruises or suspicious lesions. Lymph: No cervical or inguinal adenopathy. Neurologic: Grossly intact, no focal deficits, moving all 4 extremities. Psychiatric: Normal mood and affect.  Laboratory Data: Lab Results  Component Value Date   WBC 8.6 07/09/2013   HGB 13.5 07/09/2013   HCT 38.8 07/09/2013   MCV 91 07/09/2013   PLT 273 07/09/2013  Lab Results  Component Value Date   CREATININE 0.99 09/23/2016     Lab Results  Component Value Date   HGBA1C 5.9 06/09/2013    Lab Results  Component Value Date   TSH 0.527 06/09/2013    Lab Results  Component Value Date   AST 18 07/09/2013   Lab Results  Component Value Date   ALT 17 07/09/2013    PTNS treatment: The needle electrode was inserted into the lower, inner aspect of the patient's left leg. The surface electrode was placed on the inside arch of the foot on the treatment leg. The lead set was connected to the stimulator and the needle electrode clip was connected to the needle electrode. The stimulator that produces an adjustable electrical pulse that travels to the sacral nerve plexus via the tibial nerve was increased to 19 and tell the patient received a sensory response.     Assessment & Plan:     1. Mixed incontinence  - offered behavioral therapies, bladder training, bladder control strategies and pelvic floor muscle training - pt deferred PT  - fluid management   - offered refer to gynecology for a pessary fitting - pessary could not  be fitted - advise to use tampons/OTC POISE pessary   Treatment Plan:    The needle electrode was removed without difficulty to the patient.  Patient tolerated the procedure for 30 minutes.  She will return next week for # 2 out of 12 of their weekly PTNS treatment's  2. Microscopic hematuria  - RTC in one year for UA (11/2017)  - report any gross hematuria  3. Right renal stone  - KUB in one year (11/2017)  - Advised to contact our office or seek treatment in the ED if becomes febrile or pain/ vomiting are difficult control in order to arrange for emergent/urgent intervention   4. Nocturia  - she is starting to wear pads at night now  - RTC for PTNS   Return in about 1 week (around 01/20/2017) for # 2 PTNS.  These notes generated with voice recognition software. I apologize for typographical errors.  Zara Council, [facility] [address] [place], [place] [address] [phone]

## 2017-01-13 ENCOUNTER — Ambulatory Visit (INDEPENDENT_AMBULATORY_CARE_PROVIDER_SITE_OTHER): Payer: PPO | Admitting: Urology

## 2017-01-13 ENCOUNTER — Encounter: Payer: Self-pay | Admitting: Urology

## 2017-01-13 VITALS — BP 171/69 | HR 64 | Ht 63.0 in | Wt 150.0 lb

## 2017-01-13 DIAGNOSIS — N3941 Urge incontinence: Secondary | ICD-10-CM

## 2017-01-13 DIAGNOSIS — R351 Nocturia: Secondary | ICD-10-CM

## 2017-01-13 DIAGNOSIS — M810 Age-related osteoporosis without current pathological fracture: Secondary | ICD-10-CM | POA: Insufficient documentation

## 2017-01-13 DIAGNOSIS — N3946 Mixed incontinence: Secondary | ICD-10-CM | POA: Insufficient documentation

## 2017-01-13 DIAGNOSIS — N952 Postmenopausal atrophic vaginitis: Secondary | ICD-10-CM | POA: Insufficient documentation

## 2017-01-13 DIAGNOSIS — K219 Gastro-esophageal reflux disease without esophagitis: Secondary | ICD-10-CM | POA: Insufficient documentation

## 2017-01-13 NOTE — Progress Notes (Signed)
PTNS  Session # 1  Health & Social Factors: same Caffeine: 1 Alcohol: 0-1 Daytime voids #per day: 5-6 Night-time voids #per night: 2 Urgency: mild Incontinence Episodes #per day: 2 Ankle used: left Treatment Setting: 19 Feeling/ Response: toeflex Comments: na  Preformed By: Zara Council, PAC  Assistant: Fonnie Jarvis, CMA  Follow Up: 1 week

## 2017-01-13 NOTE — Progress Notes (Signed)
GYN ENCOUNTER NOTE  Subjective:       Erica Lane is a 83 y.o. G12P3003 female is here for gynecologic evaluation of the following issues:  1. Referral for pessary trial 2. History of mixed incontinence  83 year old white female para 3003, widowed 13 years, menopausal, on no HRT therapy, status post TAH for menorrhagia years ago, presents in referral from [facility] urology for evaluation of possible pessary to help with stress urinary incontinence. The patient has been evaluated for incontinence at [facility] urology; she is scheduled for stem therapy in the near future for management of urgency. She has unsuccessfully tried anticholinergic medications and Myrbetriq. She has declined physical therapy for pelvic floor muscle training. Urology evaluation is notable for a minimal postvoid residual.     Gynecologic History No LMP recorded. Patient has had a hysterectomy.  Obstetric History OB History  Gravida Para Term Preterm AB Living  3 3 3     3   SAB TAB Ectopic Multiple Live Births          3    # Outcome Date GA Lbr Len/2nd Weight Sex Delivery Anes PTL Lv  3 Term 1962   5 lb (2.268 kg) F Vag-Spont   LIV  2 Term 1952   5 lb 11.2 oz (2.586 kg) M Vag-Spont   LIV  1 Term 1950   5 lb 1.9 oz (2.322 kg) F Vag-Spont   LIV      Past Medical History:  Diagnosis Date  . Cancer ([place])    skin  . Heart murmur   . Heartburn   . HLD (hyperlipidemia)   . HTN (hypertension)   . Hyperthyroidism     Past Surgical History:  Procedure Laterality Date  . broken foot    . EXCISIONAL HEMORRHOIDECTOMY    . GALLBLADDER SURGERY    . MEDIAL PARTIAL KNEE REPLACEMENT    . TOTAL HIP ARTHROPLASTY Right    from crushed hip  . VAGINAL HYSTERECTOMY  1961    Current Outpatient Prescriptions on File Prior to Visit  Medication Sig Dispense Refill  . aspirin EC 81 MG tablet Take by mouth.    . calcium-vitamin D (SM CALCIUM 500/VITAMIN D3) 500-400 MG-UNIT tablet Take by mouth.    . carvedilol  (COREG) 6.25 MG tablet Take by mouth.    . cloNIDine (CATAPRES) 0.1 MG tablet 1 tablet each AM, 2 tablets each afternoon and 1 tablet at bedtime    . DIGESTIVE ENZYMES PO Take by mouth.    . fesoterodine (TOVIAZ) 8 MG TB24 tablet Take by mouth.    Marland Kitchen LACTOBACILLUS BIFIDUS PO Take by mouth.    . levothyroxine (SYNTHROID, LEVOTHROID) 100 MCG tablet TAKE 1 TABLET (100 MCG TOTAL) BY MOUTH ONCE DAILY. TAKE ON AN EMPTY STOMACH WITH A GLASS OF WATER AT LEAST 30-60 MINUTES BEFORE BREAKFAST.    Marland Kitchen Probiotic Product (PROBIOTIC PO) Take by mouth.    . ranitidine (ZANTAC) 300 MG tablet Take by mouth.    . solifenacin (VESICARE) 5 MG tablet Take by mouth.    . sucralfate (CARAFATE) 1 g tablet Take by mouth.    . telmisartan (MICARDIS) 80 MG tablet Take by mouth.    . Vitamin D, Ergocalciferol, (DRISDOL) 50000 units CAPS capsule TAKE 1 CAPSULE BY MOUTH ONCE A WEEK     No current facility-administered medications on file prior to visit.     No Known Allergies  Social History   Social History  . Marital status: Married  Spouse name: N/A  . Number of children: N/A  . Years of education: N/A   Occupational History  . Not on file.   Social History Main Topics  . Smoking status: Never Smoker  . Smokeless tobacco: Never Used  . Alcohol use Yes     Comment: occ  . Drug use: No  . Sexual activity: Not Currently   Other Topics Concern  . Not on file   Social History Narrative  . No narrative on file    Family History  Problem Relation Age of Onset  . Cancer Sister 66       stomach  . Prostate cancer Brother   . Prostate cancer Brother   . Kidney cancer Neg Hx   . Bladder Cancer Neg Hx     The following portions of the patient's history were reviewed and updated as appropriate: allergies, current medications, past family history, past medical history, past social history, past surgical history and problem list.  Review of Systems Review of Systems  Constitutional: Negative.    Respiratory: Negative.   Cardiovascular: Negative.   Gastrointestinal: Negative for abdominal pain, constipation and diarrhea.  Genitourinary: Positive for frequency, hematuria and urgency. Negative for dysuria and flank pain.       Nocturia present  Musculoskeletal: Negative.   Skin: Negative.   Neurological: Negative.   Psychiatric/Behavioral: Negative.      Objective:   BP (!) 185/72   Pulse 62   Ht 5' 3.5" (1.613 m)   Wt 150 lb (68 kg)   BMI 26.15 kg/m  CONSTITUTIONAL: Well-developed, well-nourished female in no acute distress.  HENT:  Normocephalic, atraumatic.  NECK: Not examined SKIN: Skin is warm and dry. No rash noted. Not diaphoretic. No erythema. No pallor. Reed: Alert and oriented to person, place, and time. PSYCHIATRIC: Normal mood and affect. Normal behavior. Normal judgment and thought content. CARDIOVASCULAR:Not Examined RESPIRATORY: Not Examined BREASTS: Not Examined ABDOMEN: Soft, non distended; Non tender.  No Organomegaly. PELVIC:  External Genitalia: Mild atrophic changes; the introitus is narrowed; no lesions  BUS: Urethral caruncle present  Vagina: Mild to moderate atrophy; first-degree cystocele without rotational descent of bladder neck with valsalva; no rectocele  Cervix: Surgically absent  Uterus: Surgically absent  Adnexa: Normal; nonpalpable nontender  RV: Normal external exam  Bladder: Nontender MUSCULOSKELETAL: Normal range of motion. No tenderness.  No cyanosis, clubbing, or edema.  PROCEDURE: Pessary trial  Incontinence dish-unsuccessful (smallest size available, too large)  Ring with support pessary-unsuccessful  (smallest size available, too large)   Assessment:   1. Mixed incontinence; Patient has failed anticholinergic therapy and Myrbetriq. She is undergoing stimulation therapy to help with urge component of incontinence. Unsuccessful fitting-pessary.  2. Vaginal atrophy  3. Status post hysterectomy     Plan:   1.  Pessary trial today-unsuccessful due to narrowed introitus and marginal vagina caliber 2. Recommend poise impressa intravaginal inserts to help with stress incontinence 3. Recommend Premarin cream 1/2 g intravaginal twice a week for vaginal atrophy; patient understands this may also impact on her mixed incontinence symptoms. 4. Return in 3 months for follow-up.  Brayton Mars, MD  Note: This dictation was prepared with Dragon dictation along with smaller phrase technology. Any transcriptional errors that result from this process are unintentional.

## 2017-01-18 NOTE — Progress Notes (Signed)
01/20/2017 11:31 AM   [name] Jan 09, 1928 [id number]  Referring provider: Idelle Crouch, MD [place] [facility] [place], [place] [address]  Chief Complaint  Patient presents with  . PTNS    Urge incontinence    HPI: 83 yo WF with a history of hematuria, nocturia and mixed incontinence who presents to initiate PTNS treatment. This will be #2/12 weekly treatments.    Background history Patient completed hematuria workup on 11/17/2016.  CTU and cystoscopy did not identify any worrisome findings, but she did have a small right lower pole stone.   She is having nocturia and incontinence.  She states the urine just leaks out.  She states she was on the Vesicare for one month and did not find it effective.  She wears pads continually.  She has not had dysuria, gross hematuria or suprapubic pain.  She is wearing POISE pads medium size and she leaks through the pads.  She wears pull ups at night.    She also had a trial of Toviaz and Myrbetriq, both medication she found it ineffective.  Contraindications present for PTNS      Pacemaker      Implantable defibrillator      History of abnormal bleeding      History of neuropathies or nerve damage  Discussed with patient possible complications of procedure, such as discomfort, bleeding at insertion/stimulation site, procedure consent signed  Patient goals:     - patient is wanting to see a reduction in her frequency and nocturia.   Baseline symptoms are urgency, she states that she is wearing a pad most of the time, frequency x 4-7, not restricting fluids to avoid visits to the restroom, is engaging in toilet mapping, incontinence x 4-7  and nocturia x 3.    She was seen and evaluated by Dr. Enzo Bi for pessary fitting. Unfortunately, a pessary could not be fitted due to her narrow introitus. She is advised to try the over-the-counter POISE pessary area or tampon.  She has not had the opportunity to  try the over-the-counter posteriorly as of this visit. She states that she will stop by [facility] and see if she can purchase them today.  She is using the vaginal estrogen cream 3 days weekly.  Over the last week, she is experiencing 5-6 daytime voids (stable), 2 nighttime voids (stable), mild urgency and 2 incontinence episodes daily (stable).  She is not experiencing dysuria, gross hematuria suprapubic pain.  She is not experiencing fever chills nausea or vomiting    PMH: Past Medical History:  Diagnosis Date  . Cancer ([place])    skin  . Heart murmur   . Heartburn   . HLD (hyperlipidemia)   . HTN (hypertension)   . Hyperthyroidism     Surgical History: Past Surgical History:  Procedure Laterality Date  . broken foot    . EXCISIONAL HEMORRHOIDECTOMY    . GALLBLADDER SURGERY    . MEDIAL PARTIAL KNEE REPLACEMENT    . TOTAL HIP ARTHROPLASTY Right    from crushed hip  . VAGINAL HYSTERECTOMY  1961    Home Medications:  Allergies as of 01/20/2017   No Known Allergies     Medication List       Accurate as of 01/20/17 11:31 AM. Always use your most recent med list.          aspirin EC 81 MG tablet Take by mouth.   carvedilol 6.25 MG tablet Commonly known as:  COREG Take by mouth.   cloNIDine 0.1 MG tablet Commonly known as:  CATAPRES 1 tablet each AM, 2 tablets each afternoon and 1 tablet at bedtime   conjugated estrogens vaginal cream Commonly known as:  PREMARIN Place 0.5 Applicatorfuls vaginally 2 (two) times a week.   DIGESTIVE ENZYMES PO Take by mouth.   fesoterodine 8 MG Tb24 tablet Commonly known as:  TOVIAZ Take by mouth.   LACTOBACILLUS BIFIDUS PO Take by mouth.   levothyroxine 100 MCG tablet Commonly known as:  SYNTHROID, LEVOTHROID TAKE 1 TABLET (100 MCG TOTAL) BY MOUTH ONCE DAILY. TAKE ON AN EMPTY STOMACH WITH A GLASS OF WATER AT LEAST 30-60 MINUTES BEFORE BREAKFAST.   pantoprazole 40 MG tablet Commonly known as:  PROTONIX Take 40 mg by mouth  daily.   PROBIOTIC PO Take by mouth.   ranitidine 300 MG tablet Commonly known as:  ZANTAC Take by mouth.   SM CALCIUM 500/VITAMIN D3 500-400 MG-UNIT tablet Generic drug:  calcium-vitamin D Take by mouth.   solifenacin 5 MG tablet Commonly known as:  VESICARE Take by mouth.   sucralfate 1 g tablet Commonly known as:  CARAFATE Take by mouth.   telmisartan 80 MG tablet Commonly known as:  MICARDIS Take by mouth.   Vitamin D (Ergocalciferol) 50000 units Caps capsule Commonly known as:  DRISDOL TAKE 1 CAPSULE BY MOUTH ONCE A WEEK       Allergies: No Known Allergies  Family History: Family History  Problem Relation Age of Onset  . Cancer Sister 62       stomach  . Prostate cancer Brother   . Prostate cancer Brother   . Kidney cancer Neg Hx   . Bladder Cancer Neg Hx     Social History:  reports that she has never smoked. She has never used smokeless tobacco. She reports that she drinks alcohol. She reports that she does not use drugs.  ROS: UROLOGY Frequent Urination?: No Hard to postpone urination?: No Burning/pain with urination?: No Get up at night to urinate?: No Leakage of urine?: No Urine stream starts and stops?: No Trouble starting stream?: No Do you have to strain to urinate?: No Blood in urine?: No Urinary tract infection?: No Sexually transmitted disease?: No Injury to kidneys or bladder?: No Painful intercourse?: No Weak stream?: No Currently pregnant?: No Vaginal bleeding?: No Last menstrual period?: n  Gastrointestinal Nausea?: No Vomiting?: No Indigestion/heartburn?: No Diarrhea?: No Constipation?: No  Constitutional Fever: No Night sweats?: No Weight loss?: No Fatigue?: Yes  Skin Skin rash/lesions?: No Itching?: No  Eyes Blurred vision?: No Double vision?: No  Ears/Nose/Throat Sore throat?: No Sinus problems?: No  Hematologic/Lymphatic Swollen glands?: No Easy bruising?: No  Cardiovascular Leg swelling?:  No Chest pain?: No  Respiratory Cough?: No Shortness of breath?: No  Endocrine Excessive thirst?: No  Musculoskeletal Back pain?: Yes Joint pain?: No  Neurological Headaches?: No Dizziness?: No  Psychologic Depression?: No Anxiety?: No  Physical Exam: BP 129/67   Pulse (!) 54   Ht 5' 3.5" (1.613 m)   Wt 149 lb 3.2 oz (67.7 kg)   BMI 26.02 kg/m   Constitutional: Well nourished. Alert and oriented, No acute distress. HEENT: [place] AT, moist mucus membranes. Trachea midline, no masses. Cardiovascular: No clubbing, cyanosis, or edema. Respiratory: Normal respiratory effort, no increased work of breathing. Skin: No rashes, bruises or suspicious lesions. Lymph: No cervical or inguinal adenopathy. Neurologic: Grossly intact, no focal deficits, moving all 4 extremities. Psychiatric: Normal mood and affect.  Laboratory  Data: Lab Results  Component Value Date   WBC 8.6 07/09/2013   HGB 13.5 07/09/2013   HCT 38.8 07/09/2013   MCV 91 07/09/2013   PLT 273 07/09/2013    Lab Results  Component Value Date   CREATININE 0.99 09/23/2016     Lab Results  Component Value Date   HGBA1C 5.9 06/09/2013    Lab Results  Component Value Date   TSH 0.527 06/09/2013    Lab Results  Component Value Date   AST 18 07/09/2013   Lab Results  Component Value Date   ALT 17 07/09/2013    PTNS treatment: The needle electrode was inserted into the lower, inner aspect of the patient's right leg. The surface electrode was placed on the inside arch of the foot on the treatment leg. The lead set was connected to the stimulator and the needle electrode clip was connected to the needle electrode. The stimulator that produces an adjustable electrical pulse that travels to the sacral nerve plexus via the tibial nerve was increased to 10 until the patient received a toe flex and sensory response   Assessment & Plan:     1. Mixed incontinence  - offered behavioral therapies, bladder  training, bladder control strategies and pelvic floor muscle training - pt deferred PT  - fluid management   - offered refer to gynecology for a pessary fitting - pessary could not be fitted - advise to use tampons/OTC POISE pessary - will stop by [facility] today to purchase and give a trial   Treatment Plan:    The needle electrode was removed without difficulty to the patient.  Patient tolerated the procedure for 30 minutes.  She will return next week for # 3 out of 12 of their weekly PTNS treatment's  2. Microscopic hematuria  - RTC in one year for UA (11/2017)  - report any gross hematuria  3. Right renal stone  - KUB in one year (11/2017)  - Advised to contact our office or seek treatment in the ED if becomes febrile or pain/ vomiting are difficult control in order to arrange for emergent/urgent intervention   4. Nocturia  - she is starting to wear pads at night now  - RTC for PTNS   Return in about 1 week (around 01/27/2017) for # 3/12 PTNS.  These notes generated with voice recognition software. I apologize for typographical errors.  Zara Council, [facility] [address], [place], [place] [address] [phone]

## 2017-01-20 ENCOUNTER — Encounter: Payer: Self-pay | Admitting: Urology

## 2017-01-20 ENCOUNTER — Ambulatory Visit: Payer: PPO | Admitting: Urology

## 2017-01-20 VITALS — BP 129/67 | HR 54 | Ht 63.5 in | Wt 149.2 lb

## 2017-01-20 DIAGNOSIS — R351 Nocturia: Secondary | ICD-10-CM

## 2017-01-20 DIAGNOSIS — N3946 Mixed incontinence: Secondary | ICD-10-CM

## 2017-01-20 LAB — PTNS-PERCUTANEOUS TIBIAL NERVE STIMULATION: SCAN RESULT: 10

## 2017-01-20 NOTE — Progress Notes (Signed)
PTNS  Session # 2  Health & Social Factors: No Change Caffeine: 1 Alcohol: 0-1 Daytime voids #per day: 6 Night-time voids #per night: 2 Urgency: Mild Incontinence Episodes #per day: 2-3 Ankle used: Right Treatment Setting: 10 Feeling/ Response: Both  Preformed By: Zara Council PA-C  Assistant: Lyndee Hensen CMA  Follow Up: One week

## 2017-01-27 ENCOUNTER — Ambulatory Visit (INDEPENDENT_AMBULATORY_CARE_PROVIDER_SITE_OTHER): Payer: PPO | Admitting: Urology

## 2017-01-27 ENCOUNTER — Encounter: Payer: Self-pay | Admitting: Urology

## 2017-01-27 VITALS — BP 160/70 | HR 56 | Ht 63.5 in | Wt 150.3 lb

## 2017-01-27 DIAGNOSIS — N3946 Mixed incontinence: Secondary | ICD-10-CM | POA: Diagnosis not present

## 2017-01-27 LAB — PTNS-PERCUTANEOUS TIBIAL NERVE STIMULATION: SCAN RESULT: 4

## 2017-01-27 NOTE — Progress Notes (Signed)
PTNS  Session # 3  Health & Social Factors: Change  At daughters house for 3 days hard to chart Caffeine: 1 Alcohol: 0-1 Daytime voids #per day: 6 Night-time voids #per night: 2 Urgency: Mild Incontinence Episodes #per day: 1 Ankle used: Right Treatment Setting: 4 Feeling/ Response: Both  Preformed By: Baruch Gouty MD  Assistant: Lyndee Hensen CMA  Follow Up: One week

## 2017-01-27 NOTE — Progress Notes (Signed)
01/27/2017 10:02 AM   [name] February 26, 1928 [id number]  Referring provider: Idelle Crouch, MD [place] [facility] [place], [place] [address]  Chief Complaint  Patient presents with  . PTNS    mixed incontinence    HPI: 83 yo WF with a history of hematuria, nocturia and mixed incontinence who presents to initiate PTNS treatment. This will be #3/12 weekly treatments.    Background history Patient completed hematuria workup on 11/17/2016.  CTU and cystoscopy did not identify any worrisome findings, but she did have a small right lower pole stone.   She is having nocturia and incontinence.  She states the urine just leaks out.  She states she was on the Vesicare for one month and did not find it effective.  She wears pads continually.  She has not had dysuria, gross hematuria or suprapubic pain.  She is wearing POISE pads medium size and she leaks through the pads.  She wears pull ups at night.    She also had a trial of Toviaz and Myrbetriq, both medication she found it ineffective.     PMH: Past Medical History:  Diagnosis Date  . Cancer ([place])    skin  . Heart murmur   . Heartburn   . HLD (hyperlipidemia)   . HTN (hypertension)   . Hyperthyroidism     Surgical History: Past Surgical History:  Procedure Laterality Date  . broken foot    . EXCISIONAL HEMORRHOIDECTOMY    . GALLBLADDER SURGERY    . MEDIAL PARTIAL KNEE REPLACEMENT    . TOTAL HIP ARTHROPLASTY Right    from crushed hip  . VAGINAL HYSTERECTOMY  1961    Home Medications:  Allergies as of 01/27/2017   No Known Allergies     Medication List       Accurate as of 01/27/17 10:02 AM. Always use your most recent med list.          aspirin EC 81 MG tablet Take by mouth.   carvedilol 6.25 MG tablet Commonly known as:  COREG Take by mouth.   cloNIDine 0.1 MG tablet Commonly known as:  CATAPRES 1 tablet each AM, 2 tablets each afternoon and 1 tablet at bedtime     conjugated estrogens vaginal cream Commonly known as:  PREMARIN Place 0.5 Applicatorfuls vaginally 2 (two) times a week.   DIGESTIVE ENZYMES PO Take by mouth.   fesoterodine 8 MG Tb24 tablet Commonly known as:  TOVIAZ Take by mouth.   LACTOBACILLUS BIFIDUS PO Take by mouth.   levothyroxine 100 MCG tablet Commonly known as:  SYNTHROID, LEVOTHROID TAKE 1 TABLET (100 MCG TOTAL) BY MOUTH ONCE DAILY. TAKE ON AN EMPTY STOMACH WITH A GLASS OF WATER AT LEAST 30-60 MINUTES BEFORE BREAKFAST.   pantoprazole 40 MG tablet Commonly known as:  PROTONIX Take 40 mg by mouth daily.   PROBIOTIC PO Take by mouth.   ranitidine 300 MG tablet Commonly known as:  ZANTAC Take by mouth.   SM CALCIUM 500/VITAMIN D3 500-400 MG-UNIT tablet Generic drug:  calcium-vitamin D Take by mouth.   solifenacin 5 MG tablet Commonly known as:  VESICARE Take by mouth.   sucralfate 1 g tablet Commonly known as:  CARAFATE Take by mouth.   telmisartan 80 MG tablet Commonly known as:  MICARDIS Take by mouth.   Vitamin D (Ergocalciferol) 50000 units Caps capsule Commonly known as:  DRISDOL TAKE 1 CAPSULE BY MOUTH ONCE A WEEK       Allergies:  No Known Allergies  Family History: Family History  Problem Relation Age of Onset  . Cancer Sister 22       stomach  . Prostate cancer Brother   . Prostate cancer Brother   . Kidney cancer Neg Hx   . Bladder Cancer Neg Hx     Social History:  reports that she has never smoked. She has never used smokeless tobacco. She reports that she drinks alcohol. She reports that she does not use drugs.  ROS: UROLOGY Frequent Urination?: No Hard to postpone urination?: No Burning/pain with urination?: No Get up at night to urinate?: Yes Leakage of urine?: Yes Urine stream starts and stops?: No Trouble starting stream?: No Do you have to strain to urinate?: No Blood in urine?: No Urinary tract infection?: No Sexually transmitted disease?: No Injury to  kidneys or bladder?: No Painful intercourse?: No Weak stream?: No Currently pregnant?: No Vaginal bleeding?: No Last menstrual period?: n  Gastrointestinal Nausea?: No Vomiting?: No Indigestion/heartburn?: No Diarrhea?: No Constipation?: No  Constitutional Fever: No Night sweats?: No Weight loss?: No Fatigue?: No  Skin Skin rash/lesions?: No Itching?: No  Eyes Blurred vision?: No Double vision?: No  Ears/Nose/Throat Sore throat?: No Sinus problems?: No  Hematologic/Lymphatic Swollen glands?: No Easy bruising?: No  Cardiovascular Leg swelling?: No Chest pain?: No  Respiratory Cough?: No Shortness of breath?: No  Endocrine Excessive thirst?: No  Musculoskeletal Back pain?: No Joint pain?: No  Neurological Headaches?: No Dizziness?: No  Psychologic Depression?: No Anxiety?: No  Physical Exam: BP (!) 160/70   Pulse (!) 56   Ht 5' 3.5" (1.613 m)   Wt 150 lb 4.8 oz (68.2 kg)   BMI 26.21 kg/m   Constitutional:  Alert and oriented, No acute distress. HEENT: [place] AT, moist mucus membranes.  Trachea midline, no masses. Cardiovascular: No clubbing, cyanosis, or edema. Respiratory: Normal respiratory effort, no increased work of breathing. GI: Abdomen is soft, nontender, nondistended, no abdominal masses GU: No CVA tenderness.  Skin: No rashes, bruises or suspicious lesions. Lymph: No cervical or inguinal adenopathy. Neurologic: Grossly intact, no focal deficits, moving all 4 extremities. Psychiatric: Normal mood and affect.  Laboratory Data: Lab Results  Component Value Date   WBC 8.6 07/09/2013   HGB 13.5 07/09/2013   HCT 38.8 07/09/2013   MCV 91 07/09/2013   PLT 273 07/09/2013    Lab Results  Component Value Date   CREATININE 0.99 09/23/2016    No results found for: PSA  No results found for: TESTOSTERONE  Lab Results  Component Value Date   HGBA1C 5.9 06/09/2013    Urinalysis    Component Value Date/Time   COLORURINE Yellow  06/09/2013 1556   APPEARANCEUR Clear 11/17/2016 1114   LABSPEC 1.020 06/09/2013 1556   PHURINE 6.0 06/09/2013 1556   GLUCOSEU Negative 11/17/2016 1114   GLUCOSEU Negative 06/09/2013 1556   HGBUR Negative 06/09/2013 1556   BILIRUBINUR Negative 11/17/2016 1114   BILIRUBINUR Negative 06/09/2013 1556   KETONESUR Negative 06/09/2013 1556   PROTEINUR Negative 11/17/2016 1114   PROTEINUR 30 mg/dL 06/09/2013 1556   NITRITE Negative 11/17/2016 1114   NITRITE Negative 06/09/2013 1556   LEUKOCYTESUR Negative 11/17/2016 1114   LEUKOCYTESUR Negative 06/09/2013 1556    PTNS treatment: The needle electrode was inserted into the lower, inner aspect of the patient's right leg. The surface electrode was placed on the inside arch of the foot on the treatment leg. The lead set was connected to the stimulator and the needle electrode clip  was connected to the needle electrode. The stimulator that produces an adjustable electrical pulse that travels to the sacral nerve plexus via the tibial nerve was increased to 4 until the patient received a toe flex and sensory response  Assessment & Plan:   1. Mixed incontinence -follow up as scheduled for PTNS #4 of [place], [place] [address], [place], [place] [address] [phone]

## 2017-02-02 NOTE — Progress Notes (Signed)
02/03/2017 10:30 AM   Erica Lane Sep 14, 1927 [id number]  Referring provider: Idelle Crouch, MD [facility] [place], [place] [address]  Chief Complaint  Patient presents with  . PTNS    mixed incontinence    HPI: 83 yo WF with a history of hematuria, nocturia and mixed incontinence who presents to initiate PTNS treatment. This will be #4/12 weekly treatments.    Background history Patient completed hematuria workup on 11/17/2016.  CTU and cystoscopy did not identify any worrisome findings, but she did have a small right lower pole stone.   She is having nocturia and incontinence.  She states the urine just leaks out.  She states she was on the Vesicare for one month and did not find it effective.  She wears pads continually.  She has not had dysuria, gross hematuria or suprapubic pain.  She is wearing POISE pads medium size and she leaks through the pads.  She wears pull ups at night.    She also had a trial of Toviaz and Myrbetriq, both medication she found it ineffective.  Contraindications present for PTNS      Pacemaker      Implantable defibrillator      History of abnormal bleeding      History of neuropathies or nerve damage  Discussed with patient possible complications of procedure, such as discomfort, bleeding at insertion/stimulation site, procedure consent signed  Patient goals:     - patient is wanting to see a reduction in her frequency and nocturia.   Baseline symptoms are urgency, she states that she is wearing a pad most of the time, frequency x 4-7, not restricting fluids to avoid visits to the restroom, is engaging in toilet mapping, incontinence x 4-7  and nocturia x 3.    She was seen and evaluated by Dr. Enzo Bi for pessary fitting. Unfortunately, a pessary could not be fitted due to her narrow introitus. She is advised to try the over-the-counter POISE pessary area or tampon.  She has not had the opportunity to  try the over-the-counter posteriorly as of this visit. She states that she will stop by [facility] and see if she can purchase them today.  She is using the vaginal estrogen cream 3 days weekly.  Over the last week, she is experiencing 6 daytime voids (stable), 2 nighttime voids (stable), mild urgency and 2 incontinence episodes daily (stable).  She is not experiencing dysuria, gross hematuria suprapubic pain.  She is not experiencing fever chills nausea or vomiting.    She states that she was having symptoms of urinary tract infection, dysuria and urgency, last week. She contacted her PCP who called in a prescription for an antibiotic. She states that she has taken 2 doses of antibiotics and the symptoms have resolved.    PMH: Past Medical History:  Diagnosis Date  . Cancer ([place])    skin  . Heart murmur   . Heartburn   . HLD (hyperlipidemia)   . HTN (hypertension)   . Hyperthyroidism     Surgical History: Past Surgical History:  Procedure Laterality Date  . broken foot    . EXCISIONAL HEMORRHOIDECTOMY    . GALLBLADDER SURGERY    . MEDIAL PARTIAL KNEE REPLACEMENT    . TOTAL HIP ARTHROPLASTY Right    from crushed hip  . VAGINAL HYSTERECTOMY  1961    Home Medications:  Allergies as of 02/03/2017   No Known Allergies     Medication List  Accurate as of 02/03/17 10:30 AM. Always use your most recent med list.          aspirin EC 81 MG tablet Take by mouth.   carvedilol 6.25 MG tablet Commonly known as:  COREG Take by mouth.   cloNIDine 0.1 MG tablet Commonly known as:  CATAPRES 1 tablet each AM, 2 tablets each afternoon and 1 tablet at bedtime   conjugated estrogens vaginal cream Commonly known as:  PREMARIN Place 0.5 Applicatorfuls vaginally 2 (two) times a week.   DIGESTIVE ENZYMES PO Take by mouth.   fesoterodine 8 MG Tb24 tablet Commonly known as:  TOVIAZ Take by mouth.   LACTOBACILLUS BIFIDUS PO Take by mouth.   levothyroxine 100 MCG  tablet Commonly known as:  SYNTHROID, LEVOTHROID TAKE 1 TABLET (100 MCG TOTAL) BY MOUTH ONCE DAILY. TAKE ON AN EMPTY STOMACH WITH A GLASS OF WATER AT LEAST 30-60 MINUTES BEFORE BREAKFAST.   pantoprazole 40 MG tablet Commonly known as:  PROTONIX Take 40 mg by mouth daily.   PROBIOTIC PO Take by mouth.   ranitidine 300 MG tablet Commonly known as:  ZANTAC Take by mouth.   SM CALCIUM 500/VITAMIN D3 500-400 MG-UNIT tablet Generic drug:  calcium-vitamin D Take by mouth.   solifenacin 5 MG tablet Commonly known as:  VESICARE Take by mouth.   sucralfate 1 g tablet Commonly known as:  CARAFATE Take by mouth.   telmisartan 80 MG tablet Commonly known as:  MICARDIS Take by mouth.   Vitamin D (Ergocalciferol) 50000 units Caps capsule Commonly known as:  DRISDOL TAKE 1 CAPSULE BY MOUTH ONCE A WEEK       Allergies: No Known Allergies  Family History: Family History  Problem Relation Age of Onset  . Cancer Sister 35       stomach  . Prostate cancer Brother   . Prostate cancer Brother   . Kidney cancer Neg Hx   . Bladder Cancer Neg Hx     Social History:  reports that she has never smoked. She has never used smokeless tobacco. She reports that she drinks alcohol. She reports that she does not use drugs.  ROS: UROLOGY Frequent Urination?: No Hard to postpone urination?: No Burning/pain with urination?: No Get up at night to urinate?: Yes Leakage of urine?: Yes Urine stream starts and stops?: No Trouble starting stream?: No Do you have to strain to urinate?: No Blood in urine?: No Urinary tract infection?: No Sexually transmitted disease?: No Injury to kidneys or bladder?: No Painful intercourse?: No Weak stream?: No Currently pregnant?: No Vaginal bleeding?: No Last menstrual period?: n  Gastrointestinal Nausea?: No Vomiting?: No Indigestion/heartburn?: No Diarrhea?: No Constipation?: No  Constitutional Fever: No Night sweats?: No Weight loss?:  No Fatigue?: Yes  Skin Skin rash/lesions?: No Itching?: No  Eyes Blurred vision?: No Double vision?: No  Ears/Nose/Throat Sore throat?: No Sinus problems?: No  Hematologic/Lymphatic Swollen glands?: No Easy bruising?: No  Cardiovascular Leg swelling?: No Chest pain?: No  Respiratory Cough?: No Shortness of breath?: No  Endocrine Excessive thirst?: No  Musculoskeletal Back pain?: No Joint pain?: No  Neurological Headaches?: No Dizziness?: No  Psychologic Depression?: No Anxiety?: No  Physical Exam: BP 132/65   Pulse 60   Ht 5' 3.5" (1.613 m)   Wt 150 lb 12.8 oz (68.4 kg)   BMI 26.29 kg/m   Constitutional: Well nourished. Alert and oriented, No acute distress. HEENT: [place] AT, moist mucus membranes. Trachea midline, no masses. Cardiovascular: No clubbing, cyanosis, or edema.  Respiratory: Normal respiratory effort, no increased work of breathing. Skin: No rashes, bruises or suspicious lesions. Lymph: No cervical or inguinal adenopathy. Neurologic: Grossly intact, no focal deficits, moving all 4 extremities. Psychiatric: Normal mood and affect.  Laboratory Data: Lab Results  Component Value Date   WBC 8.6 07/09/2013   HGB 13.5 07/09/2013   HCT 38.8 07/09/2013   MCV 91 07/09/2013   PLT 273 07/09/2013    Lab Results  Component Value Date   CREATININE 0.99 09/23/2016     Lab Results  Component Value Date   HGBA1C 5.9 06/09/2013    Lab Results  Component Value Date   TSH 0.527 06/09/2013    Lab Results  Component Value Date   AST 18 07/09/2013   Lab Results  Component Value Date   ALT 17 07/09/2013    PTNS treatment: The needle electrode was inserted into the lower, inner aspect of the patient's left leg. The surface electrode was placed on the inside arch of the foot on the treatment leg. The lead set was connected to the stimulator and the needle electrode clip was connected to the needle electrode. The stimulator that produces an  adjustable electrical pulse that travels to the sacral nerve plexus via the tibial nerve was increased to 11 until the patient received a toe flex and sensory response.     Assessment & Plan:     1. Mixed incontinence  - offered behavioral therapies, bladder training, bladder control strategies and pelvic floor muscle training - pt deferred PT  - fluid management   - offered refer to gynecology for a pessary fitting - pessary could not be fitted - advise to use tampons/OTC POISE pessary - will stop by [facility] today to purchase and give a trial - she has been using tampons but has not seen a difference yet   Treatment Plan:    The needle electrode was removed without difficulty to the patient.  Patient tolerated the procedure for 30 minutes.  She will return next week for # 5 out of 12 of their weekly PTNS treatment's  2. Microscopic hematuria  - RTC in one year for UA (11/2017)  - report any gross hematuria  3. Right renal stone  - KUB in one year (11/2017)  - Advised to contact our office or seek treatment in the ED if becomes febrile or pain/ vomiting are difficult control in order to arrange for emergent/urgent intervention   4. Nocturia  - she is starting to wear pads at night now  - undergoing PTNS   Return in about 1 week (around 02/10/2017) for # 5 PTNS.  These notes generated with voice recognition software. I apologize for typographical errors.  Zara Council, [facility] [address] [place], [place] [address] [phone]

## 2017-02-03 ENCOUNTER — Encounter: Payer: Self-pay | Admitting: Urology

## 2017-02-03 ENCOUNTER — Ambulatory Visit (INDEPENDENT_AMBULATORY_CARE_PROVIDER_SITE_OTHER): Payer: PPO | Admitting: Urology

## 2017-02-03 VITALS — BP 132/65 | HR 60 | Ht 63.5 in | Wt 150.8 lb

## 2017-02-03 DIAGNOSIS — R351 Nocturia: Secondary | ICD-10-CM

## 2017-02-03 DIAGNOSIS — N3946 Mixed incontinence: Secondary | ICD-10-CM

## 2017-02-03 LAB — PTNS-PERCUTANEOUS TIBIAL NERVE STIMULATION: Scan Result: 11

## 2017-02-03 NOTE — Progress Notes (Signed)
PTNS  Session # 4  Health & Social Factors: No Change Caffeine: 1 Alcohol: 0-1 Daytime voids #per day: 6 Night-time voids #per night: 2 Urgency: Mild Incontinence Episodes #per day: 2 leaks Ankle used: Left Treatment Setting: 11 Feeling/ Response: Both  Preformed By: Zara Council PA-C   Follow Up: One week

## 2017-02-08 NOTE — Progress Notes (Signed)
02/10/2017 9:56 AM   Erica Lane 01/12/1928 [id number]  Referring provider: Idelle Crouch, MD [facility] [place], [place] [address]  Chief Complaint  Patient presents with  . Urinary Incontinence    HPI: 83 yo WF with a history of hematuria, nocturia and mixed incontinence who presents to initiate PTNS treatment. This will be #5/12 weekly treatments.    Background history Patient completed hematuria workup on 11/17/2016.  CTU and cystoscopy did not identify any worrisome findings, but she did have a small right lower pole stone.   She is having nocturia and incontinence.  She states the urine just leaks out.  She states she was on the Vesicare for one month and did not find it effective.  She wears pads continually.  She has not had dysuria, gross hematuria or suprapubic pain.  She is wearing POISE pads medium size and she leaks through the pads.  She wears pull ups at night.    She also had a trial of Toviaz and Myrbetriq, both medication she found it ineffective.  Contraindications present for PTNS      Pacemaker      Implantable defibrillator      History of abnormal bleeding      History of neuropathies or nerve damage  Discussed with patient possible complications of procedure, such as discomfort, bleeding at insertion/stimulation site, procedure consent signed  Patient goals:     - patient is wanting to see a reduction in her frequency and nocturia.   Baseline symptoms are urgency, she states that she is wearing a pad most of the time, frequency x 4-7, not restricting fluids to avoid visits to the restroom, is engaging in toilet mapping, incontinence x 4-7  and nocturia x 3.    She was seen and evaluated by Dr. Enzo Bi for pessary fitting. Unfortunately, a pessary could not be fitted due to her narrow introitus. She is advised to try the over-the-counter POISE pessary area or tampon.  She has tried inserting a tampon, but she  did not find any benefit and it was uncomfortable.    She is using the vaginal estrogen cream 3 days weekly.   Over the last week, she is experiencing 6 daytime voids (stable), 2 nighttime voids (stable), mild urgency and 2 -3 incontinence episodes daily (improved).  She is not experiencing dysuria, gross hematuria suprapubic pain.  She is not experiencing fever chills nausea or vomiting.  Her urgency is mild.    PMH: Past Medical History:  Diagnosis Date  . Cancer ([place])    skin  . Heart murmur   . Heartburn   . HLD (hyperlipidemia)   . HTN (hypertension)   . Hyperthyroidism     Surgical History: Past Surgical History:  Procedure Laterality Date  . broken foot    . EXCISIONAL HEMORRHOIDECTOMY    . GALLBLADDER SURGERY    . MEDIAL PARTIAL KNEE REPLACEMENT    . TOTAL HIP ARTHROPLASTY Right    from crushed hip  . VAGINAL HYSTERECTOMY  1961    Home Medications:  Allergies as of 02/10/2017   No Known Allergies     Medication List       Accurate as of 02/10/17  9:56 AM. Always use your most recent med list.          aspirin EC 81 MG tablet Take by mouth.   carvedilol 6.25 MG tablet Commonly known as:  COREG Take by mouth.   cloNIDine 0.1  MG tablet Commonly known as:  CATAPRES 1 tablet each AM, 2 tablets each afternoon and 1 tablet at bedtime   conjugated estrogens vaginal cream Commonly known as:  PREMARIN Place 0.5 Applicatorfuls vaginally 2 (two) times a week.   DIGESTIVE ENZYMES PO Take by mouth.   fesoterodine 8 MG Tb24 tablet Commonly known as:  TOVIAZ Take by mouth.   LACTOBACILLUS BIFIDUS PO Take by mouth.   levothyroxine 100 MCG tablet Commonly known as:  SYNTHROID, LEVOTHROID TAKE 1 TABLET (100 MCG TOTAL) BY MOUTH ONCE DAILY. TAKE ON AN EMPTY STOMACH WITH A GLASS OF WATER AT LEAST 30-60 MINUTES BEFORE BREAKFAST.   pantoprazole 40 MG tablet Commonly known as:  PROTONIX Take 40 mg by mouth daily.   PROBIOTIC PO Take by mouth.   ranitidine  300 MG tablet Commonly known as:  ZANTAC Take by mouth.   SM CALCIUM 500/VITAMIN D3 500-400 MG-UNIT tablet Generic drug:  calcium-vitamin D Take by mouth.   solifenacin 5 MG tablet Commonly known as:  VESICARE Take by mouth.   sucralfate 1 g tablet Commonly known as:  CARAFATE Take by mouth.   telmisartan 80 MG tablet Commonly known as:  MICARDIS Take by mouth.   Vitamin D (Ergocalciferol) 50000 units Caps capsule Commonly known as:  DRISDOL TAKE 1 CAPSULE BY MOUTH ONCE A WEEK       Allergies: No Known Allergies  Family History: Family History  Problem Relation Age of Onset  . Cancer Sister 40       stomach  . Prostate cancer Brother   . Prostate cancer Brother   . Kidney cancer Neg Hx   . Bladder Cancer Neg Hx     Social History:  reports that she has never smoked. She has never used smokeless tobacco. She reports that she drinks alcohol. She reports that she does not use drugs.  ROS: UROLOGY Frequent Urination?: No Hard to postpone urination?: No Burning/pain with urination?: No Get up at night to urinate?: Yes Leakage of urine?: Yes Urine stream starts and stops?: No Trouble starting stream?: No Do you have to strain to urinate?: No Blood in urine?: No Urinary tract infection?: No Sexually transmitted disease?: No Injury to kidneys or bladder?: No Painful intercourse?: No Weak stream?: No Currently pregnant?: No Vaginal bleeding?: No Last menstrual period?: N  Gastrointestinal Nausea?: No Vomiting?: No Indigestion/heartburn?: Yes Diarrhea?: No Constipation?: No  Constitutional Fever: No Night sweats?: No Weight loss?: No Fatigue?: Yes  Skin Skin rash/lesions?: No Itching?: No  Eyes Blurred vision?: No Double vision?: No  Ears/Nose/Throat Sore throat?: No Sinus problems?: No  Hematologic/Lymphatic Swollen glands?: No Easy bruising?: No  Cardiovascular Leg swelling?: Yes Chest pain?: No  Respiratory Cough?: No Shortness  of breath?: No  Endocrine Excessive thirst?: No  Musculoskeletal Back pain?: No Joint pain?: No  Neurological Headaches?: No Dizziness?: No  Psychologic Depression?: No Anxiety?: No  Physical Exam: BP (!) 147/71   Pulse (!) 56   Resp 14   Ht 5' 3.5" (1.613 m)   Wt 150 lb (68 kg)   BMI 26.15 kg/m   Constitutional: Well nourished. Alert and oriented, No acute distress. HEENT: [place] AT, moist mucus membranes. Trachea midline, no masses. Cardiovascular: No clubbing, cyanosis, or edema. Respiratory: Normal respiratory effort, no increased work of breathing. Skin: No rashes, bruises or suspicious lesions. Lymph: No cervical or inguinal adenopathy. Neurologic: Grossly intact, no focal deficits, moving all 4 extremities. Psychiatric: Normal mood and affect.  Laboratory Data:  PTNS treatment: The  needle electrode was inserted into the lower, inner aspect of the patient's left leg. The surface electrode was placed on the inside arch of the foot on the treatment leg. The lead set was connected to the stimulator and the needle electrode clip was connected to the needle electrode. The stimulator that produces an adjustable electrical pulse that travels to the sacral nerve plexus via the tibial nerve was increased to 7 until the patient received a a toe flex and a sensory response.     Assessment & Plan:     1. Mixed incontinence  - offered behavioral therapies, bladder training, bladder control strategies and pelvic floor muscle training - pt deferred PT  - fluid management   - offered refer to gynecology for a pessary fitting - pessary could not be fitted - advise to use tampons/OTC POISE pessary - will stop by [facility] today to purchase and give a trial - she has been using tampons but has not seen a difference yet   Treatment Plan:    The needle electrode was removed without difficulty to the patient.  Patient tolerated the procedure for 30 minutes.  She will return next week for  # 6 out of 12 of their weekly PTNS treatment's  2. Microscopic hematuria  - RTC in one year for UA (11/2017)  - report any gross hematuria  3. Right renal stone  - KUB in one year (11/2017)  - Advised to contact our office or seek treatment in the ED if becomes febrile or pain/ vomiting are difficult control in order to arrange for emergent/urgent intervention   4. Nocturia  - she is starting to wear pads at night now  - undergoing PTNS   Return in about 1 week (around 02/17/2017) for # 6/ 12 PTNS.   These notes generated with voice recognition software. I apologize for typographical errors.  Zara Council, [facility] [address] [place], [place] [address] [phone]

## 2017-02-10 ENCOUNTER — Ambulatory Visit (INDEPENDENT_AMBULATORY_CARE_PROVIDER_SITE_OTHER): Payer: PPO | Admitting: Urology

## 2017-02-10 ENCOUNTER — Encounter: Payer: Self-pay | Admitting: Urology

## 2017-02-10 VITALS — BP 147/71 | HR 56 | Resp 14 | Ht 63.5 in | Wt 150.0 lb

## 2017-02-10 DIAGNOSIS — N3946 Mixed incontinence: Secondary | ICD-10-CM | POA: Diagnosis not present

## 2017-02-10 DIAGNOSIS — R351 Nocturia: Secondary | ICD-10-CM | POA: Diagnosis not present

## 2017-02-11 NOTE — Progress Notes (Signed)
PTNS  Session # 5  Health & Social Factors: No Change Caffeine: 1 Alcohol: 0-1 Daytime voids #per day: 6-7 Night-time voids #per night: 1-2 Urgency: mild Incontinence Episodes #per day: 2-3 Ankle used: Left Treatment Setting: 7 Feeling/ Response: Toe flex and sensory response  Preformed By: Zara Council, PA-C   Follow Up: One week

## 2017-02-18 ENCOUNTER — Ambulatory Visit: Payer: PPO | Admitting: Urology

## 2017-02-24 ENCOUNTER — Encounter: Payer: Self-pay | Admitting: Urology

## 2017-02-24 ENCOUNTER — Ambulatory Visit (INDEPENDENT_AMBULATORY_CARE_PROVIDER_SITE_OTHER): Payer: PPO | Admitting: Urology

## 2017-02-24 VITALS — BP 180/66 | HR 65 | Ht 63.0 in | Wt 151.4 lb

## 2017-02-24 DIAGNOSIS — N3946 Mixed incontinence: Secondary | ICD-10-CM

## 2017-02-24 NOTE — Progress Notes (Signed)
02/24/2017 9:29 AM   Erica Lane 10/19/1927 [id number]  Referring provider: Idelle Crouch, MD [place] [facility] [place], [place] [address]  Chief Complaint  Patient presents with  . PTNS    Mixed Incontinence    HPI: 83 yo WF with a history of hematuria, nocturia and mixed incontinence who presents to initiate PTNS treatment. This will be #6/12 weekly treatments.    Background history Patient completed hematuria workup on 11/17/2016.  CTU and cystoscopy did not identify any worrisome findings, but she did have a small right lower pole stone.   She is having nocturia and incontinence.  She states the urine just leaks out.  She states she was on the Vesicare for one month and did not find it effective.  She wears pads continually.  She has not had dysuria, gross hematuria or suprapubic pain.  She is wearing POISE pads medium size and she leaks through the pads.  She wears pull ups at night.    She also had a trial of Toviaz and Myrbetriq, both medication she found it ineffective.     PMH: Past Medical History:  Diagnosis Date  . Cancer ([place])    skin  . Heart murmur   . Heartburn   . HLD (hyperlipidemia)   . HTN (hypertension)   . Hyperthyroidism     Surgical History: Past Surgical History:  Procedure Laterality Date  . broken foot    . EXCISIONAL HEMORRHOIDECTOMY    . GALLBLADDER SURGERY    . MEDIAL PARTIAL KNEE REPLACEMENT    . TOTAL HIP ARTHROPLASTY Right    from crushed hip  . VAGINAL HYSTERECTOMY  1961    Home Medications:  Allergies as of 02/24/2017   No Known Allergies     Medication List       Accurate as of 02/24/17  9:29 AM. Always use your most recent med list.          aspirin EC 81 MG tablet Take by mouth.   carvedilol 6.25 MG tablet Commonly known as:  COREG Take by mouth.   cloNIDine 0.1 MG tablet Commonly known as:  CATAPRES 1 tablet each AM, 2 tablets each afternoon and 1 tablet at bedtime     conjugated estrogens vaginal cream Commonly known as:  PREMARIN Place 0.5 Applicatorfuls vaginally 2 (two) times a week.   DIGESTIVE ENZYMES PO Take by mouth.   fesoterodine 8 MG Tb24 tablet Commonly known as:  TOVIAZ Take by mouth.   LACTOBACILLUS BIFIDUS PO Take by mouth.   levothyroxine 100 MCG tablet Commonly known as:  SYNTHROID, LEVOTHROID TAKE 1 TABLET (100 MCG TOTAL) BY MOUTH ONCE DAILY. TAKE ON AN EMPTY STOMACH WITH A GLASS OF WATER AT LEAST 30-60 MINUTES BEFORE BREAKFAST.   pantoprazole 40 MG tablet Commonly known as:  PROTONIX Take 40 mg by mouth daily.   PROBIOTIC PO Take by mouth.   ranitidine 300 MG tablet Commonly known as:  ZANTAC Take by mouth.   SM CALCIUM 500/VITAMIN D3 500-400 MG-UNIT tablet Generic drug:  calcium-vitamin D Take by mouth.   solifenacin 5 MG tablet Commonly known as:  VESICARE Take by mouth.   sucralfate 1 g tablet Commonly known as:  CARAFATE Take by mouth.   telmisartan 80 MG tablet Commonly known as:  MICARDIS Take by mouth.   Vitamin D (Ergocalciferol) 50000 units Caps capsule Commonly known as:  DRISDOL TAKE 1 CAPSULE BY MOUTH ONCE A WEEK  Allergies: No Known Allergies  Family History: Family History  Problem Relation Age of Onset  . Cancer Sister 50       stomach  . Prostate cancer Brother   . Prostate cancer Brother   . Kidney cancer Neg Hx   . Bladder Cancer Neg Hx     Social History:  reports that she has never smoked. She has never used smokeless tobacco. She reports that she drinks alcohol. She reports that she does not use drugs.  ROS: UROLOGY Frequent Urination?: No Hard to postpone urination?: No Burning/pain with urination?: No Get up at night to urinate?: Yes Leakage of urine?: Yes Urine stream starts and stops?: No Trouble starting stream?: No Do you have to strain to urinate?: No Blood in urine?: No Urinary tract infection?: No Sexually transmitted disease?: No Injury to  kidneys or bladder?: No Painful intercourse?: No Weak stream?: No Currently pregnant?: No Vaginal bleeding?: No Last menstrual period?: n  Gastrointestinal Nausea?: No Vomiting?: No Indigestion/heartburn?: Yes Diarrhea?: No Constipation?: No  Constitutional Fever: No Night sweats?: No Weight loss?: No Fatigue?: Yes  Skin Skin rash/lesions?: No Itching?: No  Eyes Blurred vision?: No Double vision?: No  Ears/Nose/Throat Sore throat?: No Sinus problems?: No  Hematologic/Lymphatic Swollen glands?: No Easy bruising?: No  Cardiovascular Leg swelling?: Yes (y) Chest pain?: No  Respiratory Cough?: No Shortness of breath?: No  Endocrine Excessive thirst?: No  Musculoskeletal Back pain?: No Joint pain?: No  Neurological Headaches?: No Dizziness?: No  Psychologic Depression?: No Anxiety?: No  Physical Exam: BP (!) 180/66   Pulse 65   Ht 5\' 3"  (1.6 m)   Wt 151 lb 6.4 oz (68.7 kg)   BMI 26.82 kg/m   Constitutional:  Alert and oriented, No acute distress. HEENT: [place] AT, moist mucus membranes.  Trachea midline, no masses. Cardiovascular: No clubbing, cyanosis, or edema. Respiratory: Normal respiratory effort, no increased work of breathing. GI: Abdomen is soft, nontender, nondistended, no abdominal masses GU: No CVA tenderness.  Skin: No rashes, bruises or suspicious lesions. Lymph: No cervical or inguinal adenopathy. Neurologic: Grossly intact, no focal deficits, moving all 4 extremities. Psychiatric: Normal mood and affect.  Laboratory Data: Lab Results  Component Value Date   WBC 8.6 07/09/2013   HGB 13.5 07/09/2013   HCT 38.8 07/09/2013   MCV 91 07/09/2013   PLT 273 07/09/2013    Lab Results  Component Value Date   CREATININE 0.99 09/23/2016    No results found for: PSA  No results found for: TESTOSTERONE  Lab Results  Component Value Date   HGBA1C 5.9 06/09/2013    Urinalysis    Component Value Date/Time   COLORURINE Yellow  06/09/2013 1556   APPEARANCEUR Clear 11/17/2016 1114   LABSPEC 1.020 06/09/2013 1556   PHURINE 6.0 06/09/2013 1556   GLUCOSEU Negative 11/17/2016 1114   GLUCOSEU Negative 06/09/2013 1556   HGBUR Negative 06/09/2013 1556   BILIRUBINUR Negative 11/17/2016 1114   BILIRUBINUR Negative 06/09/2013 1556   KETONESUR Negative 06/09/2013 1556   PROTEINUR Negative 11/17/2016 1114   PROTEINUR 30 mg/dL 06/09/2013 1556   NITRITE Negative 11/17/2016 1114   NITRITE Negative 06/09/2013 1556   LEUKOCYTESUR Negative 11/17/2016 1114   LEUKOCYTESUR Negative 06/09/2013 1556   PTNS treatment: The needle electrode was inserted into the lower, inner aspect of the patient's left leg. The surface electrode was placed on the inside arch of the foot on the treatment leg. The lead set was connected to the stimulator and the needle electrode clip  was connected to the needle electrode. The stimulator that produces an adjustable electrical pulse that travels to the sacral nerve plexus via the tibial nerve was increased to 14 until the patient received a a toe flex and a sensory response.     Assessment & Plan:   1. Mixed incontinence -follow up one week for next [place], [place] [address] [place], [place] [address] [phone]

## 2017-02-24 NOTE — Progress Notes (Signed)
PTNS  Session # 6  Health & Social Factors: No Change Caffeine: 1 Alcohol: 0-1 Daytime voids #per day: 6-7 Night-time voids #per night: 1-2 Urgency: Mild Incontinence Episodes #per day: 2-3 Ankle used: Right Treatment Setting: 14 Feeling/ Response: Both  Preformed By: Baruch Gouty MD  Assistant: Lyndee Hensen CMA  Follow Up: One Week

## 2017-03-02 NOTE — Progress Notes (Signed)
03/03/2017 1:18 PM   Erica Lane 1927-09-04 [id number]  Referring provider: Idelle Crouch, MD [place] [facility] [place], [place] [address]  Chief Complaint  Patient presents with  . PTNS    MIxed Incontinence    HPI: 83 yo WF with a history of hematuria, nocturia and mixed incontinence who presents to initiate PTNS treatment. This will be #7/12 weekly treatments.    Background history Patient completed hematuria workup on 11/17/2016.  CTU and cystoscopy did not identify any worrisome findings, but she did have a small right lower pole stone.   She is having nocturia and incontinence.  She states the urine just leaks out.  She states she was on the Vesicare for one month and did not find it effective.  She wears pads continually.  She has not had dysuria, gross hematuria or suprapubic pain.  She is wearing POISE pads medium size and she leaks through the pads.  She wears pull ups at night.    She also had a trial of Toviaz and Myrbetriq, both medication she found it ineffective.  Contraindications present for PTNS      Pacemaker      Implantable defibrillator      History of abnormal bleeding      History of neuropathies or nerve damage  Discussed with patient possible complications of procedure, such as discomfort, bleeding at insertion/stimulation site, procedure consent signed  Patient goals:     - patient is wanting to see a reduction in her frequency and nocturia.   Baseline symptoms are urgency, she states that she is wearing a pad most of the time, frequency x 4-7, not restricting fluids to avoid visits to the restroom, is engaging in toilet mapping, incontinence x 4-7  and nocturia x 3.    She was seen and evaluated by Dr. Enzo Bi for pessary fitting. Unfortunately, a pessary could not be fitted due to her narrow introitus. She is advised to try the over-the-counter POISE pessary area or tampon.  She has tried inserting a tampon, but  she did not find any benefit and it was uncomfortable.    She is using the vaginal estrogen cream 3 days weekly.   Over the last week, she is experiencing 7 daytime voids (stable), 3 nighttime voids (stable), mild urgency and 1 incontinence episodes daily (improved).  She is not experiencing dysuria, gross hematuria suprapubic pain.  She is not experiencing fever chills nausea or vomiting.  Her urgency is mild.    PMH: Past Medical History:  Diagnosis Date  . Cancer ([place])    skin  . Heart murmur   . Heartburn   . HLD (hyperlipidemia)   . HTN (hypertension)   . Hyperthyroidism     Surgical History: Past Surgical History:  Procedure Laterality Date  . broken foot    . EXCISIONAL HEMORRHOIDECTOMY    . GALLBLADDER SURGERY    . MEDIAL PARTIAL KNEE REPLACEMENT    . TOTAL HIP ARTHROPLASTY Right    from crushed hip  . VAGINAL HYSTERECTOMY  1961    Home Medications:  Allergies as of 03/03/2017   No Known Allergies     Medication List       Accurate as of 03/03/17  1:18 PM. Always use your most recent med list.          aspirin EC 81 MG tablet Take by mouth.   carvedilol 6.25 MG tablet Commonly known as:  COREG Take by mouth.  cloNIDine 0.1 MG tablet Commonly known as:  CATAPRES 1 tablet each AM, 2 tablets each afternoon and 1 tablet at bedtime   conjugated estrogens vaginal cream Commonly known as:  PREMARIN Place 0.5 Applicatorfuls vaginally 2 (two) times a week.   DIGESTIVE ENZYMES PO Take by mouth.   fesoterodine 8 MG Tb24 tablet Commonly known as:  TOVIAZ Take by mouth.   LACTOBACILLUS BIFIDUS PO Take by mouth.   levothyroxine 100 MCG tablet Commonly known as:  SYNTHROID, LEVOTHROID TAKE 1 TABLET (100 MCG TOTAL) BY MOUTH ONCE DAILY. TAKE ON AN EMPTY STOMACH WITH A GLASS OF WATER AT LEAST 30-60 MINUTES BEFORE BREAKFAST.   pantoprazole 40 MG tablet Commonly known as:  PROTONIX Take 40 mg by mouth daily.   PROBIOTIC PO Take by mouth.   ranitidine  300 MG tablet Commonly known as:  ZANTAC Take by mouth.   SM CALCIUM 500/VITAMIN D3 500-400 MG-UNIT tablet Generic drug:  calcium-vitamin D Take by mouth.   solifenacin 5 MG tablet Commonly known as:  VESICARE Take by mouth.   sucralfate 1 g tablet Commonly known as:  CARAFATE Take by mouth.   telmisartan 80 MG tablet Commonly known as:  MICARDIS Take by mouth.   Vitamin D (Ergocalciferol) 50000 units Caps capsule Commonly known as:  DRISDOL TAKE 1 CAPSULE BY MOUTH ONCE A WEEK       Allergies: No Known Allergies  Family History: Family History  Problem Relation Age of Onset  . Cancer Sister 41       stomach  . Prostate cancer Brother   . Prostate cancer Brother   . Kidney cancer Neg Hx   . Bladder Cancer Neg Hx     Social History:  reports that she has never smoked. She has never used smokeless tobacco. She reports that she drinks alcohol. She reports that she does not use drugs.  ROS: UROLOGY Frequent Urination?: No Hard to postpone urination?: No Burning/pain with urination?: No Get up at night to urinate?: Yes Leakage of urine?: Yes Urine stream starts and stops?: No Trouble starting stream?: No Do you have to strain to urinate?: No Blood in urine?: No Urinary tract infection?: No Sexually transmitted disease?: No Injury to kidneys or bladder?: No Painful intercourse?: No Weak stream?: No Currently pregnant?: No Vaginal bleeding?: No Last menstrual period?: n  Gastrointestinal Nausea?: No Vomiting?: No Indigestion/heartburn?: No Diarrhea?: No Constipation?: No  Constitutional Fever: No Night sweats?: No Weight loss?: No Fatigue?: No  Skin Skin rash/lesions?: No Itching?: No  Eyes Blurred vision?: No Double vision?: No  Ears/Nose/Throat Sore throat?: No Sinus problems?: No  Hematologic/Lymphatic Swollen glands?: No Easy bruising?: No  Cardiovascular Leg swelling?: No Chest pain?: No  Respiratory Cough?: No Shortness of  breath?: No  Endocrine Excessive thirst?: No  Musculoskeletal Back pain?: No Joint pain?: No  Neurological Headaches?: No Dizziness?: No  Psychologic Depression?: No Anxiety?: No  Physical Exam: BP (!) 169/66   Pulse (!) 58   Ht 5\' 3"  (1.6 m)   Wt 151 lb 8 oz (68.7 kg)   BMI 26.84 kg/m   Constitutional: Well nourished. Alert and oriented, No acute distress. HEENT: [place] AT, moist mucus membranes. Trachea midline, no masses. Cardiovascular: No clubbing, cyanosis, or edema. Respiratory: Normal respiratory effort, no increased work of breathing. Skin: No rashes, bruises or suspicious lesions. Lymph: No cervical or inguinal adenopathy. Neurologic: Grossly intact, no focal deficits, moving all 4 extremities. Psychiatric: Normal mood and affect.  Laboratory Data:  PTNS treatment: The  needle electrode was inserted into the lower, inner aspect of the patient's left leg. The surface electrode was placed on the inside arch of the foot on the treatment leg. The lead set was connected to the stimulator and the needle electrode clip was connected to the needle electrode. The stimulator that produces an adjustable electrical pulse that travels to the sacral nerve plexus via the tibial nerve was increased to 19 until the patient received a sensory response.     Assessment & Plan:     1. Mixed incontinence  - offered behavioral therapies, bladder training, bladder control strategies and pelvic floor muscle training - pt deferred PT  - fluid management   - offered refer to gynecology for a pessary fitting - pessary could not be fitted - advise to use tampons/OTC POISE pessary - will stop by [employer] today to purchase and give a trial - she has been using tampons but has not seen a difference yet   Treatment Plan:    The needle electrode was removed without difficulty to the patient.  Patient tolerated the procedure for 30 minutes.  She will return next week for # 8 out of 12 of their  weekly PTNS treatment's  2. Microscopic hematuria  - RTC in one year for UA (11/2017)  - report any gross hematuria  3. Right renal stone  - KUB in one year (11/2017)  - Advised to contact our office or seek treatment in the ED if becomes febrile or pain/ vomiting are difficult control in order to arrange for emergent/urgent intervention   4. Nocturia  - she is starting to wear pads at night now  - undergoing PTNS   Return in about 1 week (around 03/10/2017) for # 8/12 PTNS.   These notes generated with voice recognition software. I apologize for typographical errors.  Zara Council, [facility] [address], [place], [place] [address] [phone]

## 2017-03-03 ENCOUNTER — Encounter: Payer: Self-pay | Admitting: Urology

## 2017-03-03 ENCOUNTER — Ambulatory Visit: Payer: PPO | Admitting: Urology

## 2017-03-03 VITALS — BP 169/66 | HR 58 | Ht 63.0 in | Wt 151.5 lb

## 2017-03-03 DIAGNOSIS — N3946 Mixed incontinence: Secondary | ICD-10-CM

## 2017-03-03 DIAGNOSIS — R351 Nocturia: Secondary | ICD-10-CM

## 2017-03-03 NOTE — Progress Notes (Signed)
PTNS  Session # 7  Health & Social Factors: No Change Caffeine: 1 Alcohol: 0-1 Daytime voids #per day: 7 Night-time voids #per night: 3 Urgency: Mild Incontinence Episodes #per day: 1 Ankle used: Left Treatment Setting: 19 Feeling/ Response: Sensory  Preformed By: Zara Council PA-C and PA Student Assistant: Lyndee Hensen CMA  Follow Up: One week

## 2017-03-09 NOTE — Progress Notes (Signed)
03/10/2017 10:44 AM   [name] 27-Mar-1928 [id number]  Referring provider: Idelle Crouch, MD [facility] [place], [place] [address]  Chief Complaint  Patient presents with  . PTNS    Mixed incontinence    HPI: 83 yo WF with a history of hematuria, nocturia and mixed incontinence who presents to initiate PTNS treatment. This will be #8/12 weekly treatments.    Background history Patient completed hematuria workup on 11/17/2016.  CTU and cystoscopy did not identify any worrisome findings, but she did have a small right lower pole stone.   She is having nocturia and incontinence.  She states the urine just leaks out.  She states she was on the Vesicare for one month and did not find it effective.  She wears pads continually.  She has not had dysuria, gross hematuria or suprapubic pain.  She is wearing POISE pads medium size and she leaks through the pads.  She wears pull ups at night.    She also had a trial of Toviaz and Myrbetriq, both medication she found it ineffective.  Contraindications present for PTNS      Pacemaker      Implantable defibrillator      History of abnormal bleeding      History of neuropathies or nerve damage  Discussed with patient possible complications of procedure, such as discomfort, bleeding at insertion/stimulation site, procedure consent signed  Patient goals:     - patient is wanting to see a reduction in her frequency and nocturia.   Baseline symptoms are urgency, she states that she is wearing a pad most of the time, frequency x 4-7, not restricting fluids to avoid visits to the restroom, is engaging in toilet mapping, incontinence x 4-7  and nocturia x 3.    She was seen and evaluated by Dr. Enzo Bi for pessary fitting. Unfortunately, a pessary could not be fitted due to her narrow introitus. She is advised to try the over-the-counter POISE pessary area or tampon.  She has tried inserting a tampon,  but she did not find any benefit and it was uncomfortable.    She is using the vaginal estrogen cream 3 days weekly.   Over the last week, she is experiencing 7 daytime voids (stable), 2 nighttime voids (improve), none urgency and 2 incontinence episodes daily (worse).  She is not experiencing dysuria, gross hematuria suprapubic pain.  She is not experiencing fever chills nausea or vomiting.  Her urgency is mild.    PMH: Past Medical History:  Diagnosis Date  . Cancer ([place])    skin  . Heart murmur   . Heartburn   . HLD (hyperlipidemia)   . HTN (hypertension)   . Hyperthyroidism     Surgical History: Past Surgical History:  Procedure Laterality Date  . broken foot    . EXCISIONAL HEMORRHOIDECTOMY    . GALLBLADDER SURGERY    . MEDIAL PARTIAL KNEE REPLACEMENT    . TOTAL HIP ARTHROPLASTY Right    from crushed hip  . VAGINAL HYSTERECTOMY  1961    Home Medications:  Allergies as of 03/10/2017   No Known Allergies     Medication List       Accurate as of 03/10/17 10:44 AM. Always use your most recent med list.          aspirin EC 81 MG tablet Take by mouth.   carvedilol 6.25 MG tablet Commonly known as:  COREG Take by mouth.  cloNIDine 0.1 MG tablet Commonly known as:  CATAPRES 1 tablet each AM, 2 tablets each afternoon and 1 tablet at bedtime   conjugated estrogens vaginal cream Commonly known as:  PREMARIN Place 0.5 Applicatorfuls vaginally 2 (two) times a week.   DIGESTIVE ENZYMES PO Take by mouth.   fesoterodine 8 MG Tb24 tablet Commonly known as:  TOVIAZ Take by mouth.   LACTOBACILLUS BIFIDUS PO Take by mouth.   levothyroxine 100 MCG tablet Commonly known as:  SYNTHROID, LEVOTHROID TAKE 1 TABLET (100 MCG TOTAL) BY MOUTH ONCE DAILY. TAKE ON AN EMPTY STOMACH WITH A GLASS OF WATER AT LEAST 30-60 MINUTES BEFORE BREAKFAST.   pantoprazole 40 MG tablet Commonly known as:  PROTONIX Take 40 mg by mouth daily.   PROBIOTIC PO Take by mouth.     ranitidine 300 MG tablet Commonly known as:  ZANTAC Take by mouth.   SM CALCIUM 500/VITAMIN D3 500-400 MG-UNIT tablet Generic drug:  calcium-vitamin D Take by mouth.   solifenacin 5 MG tablet Commonly known as:  VESICARE Take by mouth.   sucralfate 1 g tablet Commonly known as:  CARAFATE Take by mouth.   telmisartan 80 MG tablet Commonly known as:  MICARDIS Take by mouth.   Vitamin D (Ergocalciferol) 50000 units Caps capsule Commonly known as:  DRISDOL TAKE 1 CAPSULE BY MOUTH ONCE A WEEK       Allergies: No Known Allergies  Family History: Family History  Problem Relation Age of Onset  . Cancer Sister 40       stomach  . Prostate cancer Brother   . Prostate cancer Brother   . Kidney cancer Neg Hx   . Bladder Cancer Neg Hx     Social History:  reports that she has never smoked. She has never used smokeless tobacco. She reports that she drinks alcohol. She reports that she does not use drugs.  ROS: UROLOGY Frequent Urination?: No Hard to postpone urination?: Yes Burning/pain with urination?: No Get up at night to urinate?: Yes Leakage of urine?: Yes Urine stream starts and stops?: No Trouble starting stream?: No Do you have to strain to urinate?: No Blood in urine?: No Urinary tract infection?: No Sexually transmitted disease?: No Injury to kidneys or bladder?: No Painful intercourse?: No Weak stream?: No Currently pregnant?: No Vaginal bleeding?: No Last menstrual period?: n  Gastrointestinal Nausea?: No Vomiting?: No Indigestion/heartburn?: No Diarrhea?: No Constipation?: No  Constitutional Fever: No Night sweats?: No Weight loss?: No Fatigue?: Yes  Skin Skin rash/lesions?: No Itching?: No  Eyes Blurred vision?: No Double vision?: No  Ears/Nose/Throat Sore throat?: No Sinus problems?: No  Hematologic/Lymphatic Swollen glands?: No Easy bruising?: Yes  Cardiovascular Leg swelling?: No Chest pain?: No  Respiratory Cough?:  No Shortness of breath?: No  Endocrine Excessive thirst?: No  Musculoskeletal Back pain?: No Joint pain?: No  Neurological Headaches?: No Dizziness?: No  Psychologic Depression?: No Anxiety?: No  Physical Exam: BP (!) 159/67   Pulse 62   Ht 5\' 3"  (1.6 m)   Wt 148 lb 14.4 oz (67.5 kg)   BMI 26.38 kg/m   Constitutional: Well nourished. Alert and oriented, No acute distress. HEENT: [place] AT, moist mucus membranes. Trachea midline, no masses. Cardiovascular: No clubbing, cyanosis, or edema. Respiratory: Normal respiratory effort, no increased work of breathing. Skin: No rashes, bruises or suspicious lesions. Lymph: No cervical or inguinal adenopathy. Neurologic: Grossly intact, no focal deficits, moving all 4 extremities. Psychiatric: Normal mood and affect.  Laboratory Data:  PTNS treatment: The  needle electrode was inserted into the lower, inner aspect of the patient's right leg. The surface electrode was placed on the inside arch of the foot on the treatment leg. The lead set was connected to the stimulator and the needle electrode clip was connected to the needle electrode. The stimulator that produces an adjustable electrical pulse that travels to the sacral nerve plexus via the tibial nerve was increased to 16 until the patient received a sensory response.     Assessment & Plan:     1. Mixed incontinence  - offered behavioral therapies, bladder training, bladder control strategies and pelvic floor muscle training - pt deferred PT  - fluid management   - offered refer to gynecology for a pessary fitting - pessary could not be fitted - advise to use tampons/OTC POISE pessary - will stop by [employer] today to purchase and give a trial - she has been using tampons but has not seen a difference yet   Treatment Plan:    The needle electrode was removed without difficulty to the patient.  Patient tolerated the procedure for 30 minutes.  She will return next week for # 9 out of  12 of their weekly PTNS treatment's  2. Microscopic hematuria  - RTC in one year for UA (11/2017)  - report any gross hematuria  3. Right renal stone  - KUB in one year (11/2017)  - Advised to contact our office or seek treatment in the ED if becomes febrile or pain/ vomiting are difficult control in order to arrange for emergent/urgent intervention   4. Nocturia  - she is starting to wear pads at night now  - undergoing PTNS   Return in about 1 week (around 03/17/2017) for # 9/12 PTNS.   These notes generated with voice recognition software. I apologize for typographical errors.  Zara Council, [facility] [address] [place], [place] [address] [phone]

## 2017-03-10 ENCOUNTER — Encounter: Payer: Self-pay | Admitting: Urology

## 2017-03-10 ENCOUNTER — Ambulatory Visit: Payer: PPO | Admitting: Urology

## 2017-03-10 VITALS — BP 159/67 | HR 62 | Ht 63.0 in | Wt 148.9 lb

## 2017-03-10 DIAGNOSIS — R351 Nocturia: Secondary | ICD-10-CM

## 2017-03-10 DIAGNOSIS — N3946 Mixed incontinence: Secondary | ICD-10-CM | POA: Diagnosis not present

## 2017-03-10 NOTE — Progress Notes (Signed)
PTNS  Session # 8  Health & Social Factors: No Change Caffeine: 1 Alcohol: 0-1 Daytime voids #per day: 7 Night-time voids #per night: 2 Urgency: None Incontinence Episodes #per day: 2 Ankle used: Right Treatment Setting: 16 Feeling/ Response: Sensory  Preformed By: Zara Council PA-C  Assistant: PA Student Lucy  Follow Up: One week

## 2017-03-16 NOTE — Progress Notes (Signed)
03/17/2017 11:02 PM   Erica Lane 1927-11-11 [id number]  Referring provider: Idelle Crouch, MD [place] [facility] [place], [place] [address]  Chief Complaint  Patient presents with  . PTNS    Mixed incontinence    HPI: 83 yo WF with a history of hematuria, nocturia and mixed incontinence who presents to initiate PTNS treatment. This will be #9/12 weekly treatments.    Background history Patient completed hematuria workup on 11/17/2016.  CTU and cystoscopy did not identify any worrisome findings, but she did have a small right lower pole stone.   She is having nocturia and incontinence.  She states the urine just leaks out.  She states she was on the Vesicare for one month and did not find it effective.  She wears pads continually.  She has not had dysuria, gross hematuria or suprapubic pain.  She is wearing POISE pads medium size and she leaks through the pads.  She wears pull ups at night.    She also had a trial of Toviaz and Myrbetriq, both medication she found it ineffective.  Contraindications present for PTNS      Pacemaker      Implantable defibrillator      History of abnormal bleeding      History of neuropathies or nerve damage  Discussed with patient possible complications of procedure, such as discomfort, bleeding at insertion/stimulation site, procedure consent signed  Patient goals:     - patient is wanting to see a reduction in her frequency and nocturia.   Baseline symptoms are urgency, she states that she is wearing a pad most of the time, frequency x 4-7, not restricting fluids to avoid visits to the restroom, is engaging in toilet mapping, incontinence x 4-7  and nocturia x 3.    She was seen and evaluated by Dr. Enzo Bi for pessary fitting. Unfortunately, a pessary could not be fitted due to her narrow introitus. She is advised to try the over-the-counter POISE pessary area or tampon.  She has tried inserting a tampon,  but she did not find any benefit and it was uncomfortable.    She is using the vaginal estrogen cream 3 days weekly.   Over the last week, she is experiencing 7 daytime voids (stable), 2 nighttime voids (improved), no urgency and 0 incontinence episodes daily (improved).  She is not experiencing dysuria, gross hematuria suprapubic pain.  She is not experiencing fever chills nausea or vomiting.    PMH: Past Medical History:  Diagnosis Date  . Cancer ([place])    skin  . Heart murmur   . Heartburn   . HLD (hyperlipidemia)   . HTN (hypertension)   . Hyperthyroidism     Surgical History: Past Surgical History:  Procedure Laterality Date  . broken foot    . EXCISIONAL HEMORRHOIDECTOMY    . GALLBLADDER SURGERY    . MEDIAL PARTIAL KNEE REPLACEMENT    . TOTAL HIP ARTHROPLASTY Right    from crushed hip  . VAGINAL HYSTERECTOMY  1961    Home Medications:  Allergies as of 03/17/2017   No Known Allergies     Medication List       Accurate as of 03/17/17 11:02 PM. Always use your most recent med list.          aspirin EC 81 MG tablet Take by mouth.   carvedilol 6.25 MG tablet Commonly known as:  COREG Take by mouth.   cloNIDine 0.1 MG tablet Commonly  known as:  CATAPRES 1 tablet each AM, 2 tablets each afternoon and 1 tablet at bedtime   conjugated estrogens vaginal cream Commonly known as:  PREMARIN Place 0.5 Applicatorfuls vaginally 2 (two) times a week.   DIGESTIVE ENZYMES PO Take by mouth.   fesoterodine 8 MG Tb24 tablet Commonly known as:  TOVIAZ Take by mouth.   LACTOBACILLUS BIFIDUS PO Take by mouth.   levothyroxine 100 MCG tablet Commonly known as:  SYNTHROID, LEVOTHROID TAKE 1 TABLET (100 MCG TOTAL) BY MOUTH ONCE DAILY. TAKE ON AN EMPTY STOMACH WITH A GLASS OF WATER AT LEAST 30-60 MINUTES BEFORE BREAKFAST.   pantoprazole 40 MG tablet Commonly known as:  PROTONIX Take 40 mg by mouth daily.   PROBIOTIC PO Take by mouth.   ranitidine 300 MG  tablet Commonly known as:  ZANTAC Take by mouth.   ranitidine 300 MG tablet Commonly known as:  ZANTAC TAKE ONE TABLET BY MOUTH EVERY NIGHT   SM CALCIUM 500/VITAMIN D3 500-400 MG-UNIT tablet Generic drug:  calcium-vitamin D Take by mouth.   solifenacin 5 MG tablet Commonly known as:  VESICARE Take by mouth.   sucralfate 1 g tablet Commonly known as:  CARAFATE Take by mouth.   telmisartan 80 MG tablet Commonly known as:  MICARDIS Take by mouth.   Vitamin D (Ergocalciferol) 50000 units Caps capsule Commonly known as:  DRISDOL TAKE 1 CAPSULE BY MOUTH ONCE A WEEK       Allergies: No Known Allergies  Family History: Family History  Problem Relation Age of Onset  . Cancer Sister 67       stomach  . Prostate cancer Brother   . Prostate cancer Brother   . Kidney cancer Neg Hx   . Bladder Cancer Neg Hx     Social History:  reports that she has never smoked. She has never used smokeless tobacco. She reports that she drinks alcohol. She reports that she does not use drugs.  ROS: UROLOGY Frequent Urination?: No Hard to postpone urination?: No Burning/pain with urination?: No Get up at night to urinate?: Yes Leakage of urine?: Yes Urine stream starts and stops?: No Trouble starting stream?: No Do you have to strain to urinate?: No Blood in urine?: No Urinary tract infection?: No Sexually transmitted disease?: No Injury to kidneys or bladder?: No Painful intercourse?: No Weak stream?: No Currently pregnant?: No Vaginal bleeding?: No Last menstrual period?: n  Gastrointestinal Nausea?: No Vomiting?: No Indigestion/heartburn?: No Diarrhea?: No Constipation?: No  Constitutional Fever: No Night sweats?: No Weight loss?: No Fatigue?: Yes  Skin Skin rash/lesions?: No Itching?: No  Eyes Blurred vision?: No Double vision?: No  Ears/Nose/Throat Sore throat?: No Sinus problems?: No  Hematologic/Lymphatic Swollen glands?: No Easy bruising?:  Yes  Cardiovascular Leg swelling?: No Chest pain?: No  Respiratory Cough?: No Shortness of breath?: No  Endocrine Excessive thirst?: No  Musculoskeletal Back pain?: No Joint pain?: No  Neurological Headaches?: No Dizziness?: No  Psychologic Depression?: No Anxiety?: No  Physical Exam: BP (!) 154/76   Pulse (!) 59   Ht 5' 1.5" (1.562 m)   Wt 150 lb 3.2 oz (68.1 kg)   BMI 27.92 kg/m   Constitutional: Well nourished. Alert and oriented, No acute distress. HEENT: [place] AT, moist mucus membranes. Trachea midline, no masses. Cardiovascular: No clubbing, cyanosis, or edema. Respiratory: Normal respiratory effort, no increased work of breathing. Skin: No rashes, bruises or suspicious lesions. Lymph: No cervical or inguinal adenopathy. Neurologic: Grossly intact, no focal deficits, moving all 4  extremities. Psychiatric: Normal mood and affect.  Laboratory Data:  PTNS treatment: The needle electrode was inserted into the lower, inner aspect of the patient's left leg. The surface electrode was placed on the inside arch of the foot on the treatment leg. The lead set was connected to the stimulator and the needle electrode clip was connected to the needle electrode. The stimulator that produces an adjustable electrical pulse that travels to the sacral nerve plexus via the tibial nerve was increased to 1 until the patient received a sensory response.     Assessment & Plan:     1. Mixed incontinence  - offered behavioral therapies, bladder training, bladder control strategies and pelvic floor muscle training - pt deferred PT  - fluid management   - offered refer to gynecology for a pessary fitting - pessary could not be fitted    Treatment Plan:    The needle electrode was removed without difficulty to the patient.  Patient tolerated the procedure for 30 minutes.  She will return next week for # 10 out of 12 of their weekly PTNS treatment's  2. Microscopic hematuria  - RTC  in one year for UA (11/2017)  - report any gross hematuria  3. Right renal stone  - KUB in one year (11/2017)  - Advised to contact our office or seek treatment in the ED if becomes febrile or pain/ vomiting are difficult control in order to arrange for emergent/urgent intervention   4. Nocturia  - she is starting to wear pads at night now  - undergoing PTNS   Return in about 1 week (around 03/24/2017).   These notes generated with voice recognition software. I apologize for typographical errors.  Zara Council, [facility] [address] [place], [place] [address] [phone]

## 2017-03-17 ENCOUNTER — Encounter: Payer: Self-pay | Admitting: Urology

## 2017-03-17 ENCOUNTER — Ambulatory Visit: Payer: PPO | Admitting: Urology

## 2017-03-17 VITALS — BP 154/76 | HR 59 | Ht 61.5 in | Wt 150.2 lb

## 2017-03-17 DIAGNOSIS — R351 Nocturia: Secondary | ICD-10-CM | POA: Diagnosis not present

## 2017-03-17 DIAGNOSIS — N3946 Mixed incontinence: Secondary | ICD-10-CM

## 2017-03-17 NOTE — Progress Notes (Signed)
PTNS  Session # 9  Health & Social Factors: No Change Caffeine: 1 Alcohol: 0-1 Daytime voids #per day: 7 Night-time voids #per night: 2 Urgency: None Incontinence Episodes #per day: 0 Ankle used: Left Treatment Setting: 1 Feeling/ Response: Sensory  Preformed By: Zara Council PA-C and PA Student Lucy  Follow Up: One week

## 2017-03-18 DIAGNOSIS — L570 Actinic keratosis: Secondary | ICD-10-CM | POA: Diagnosis not present

## 2017-03-18 DIAGNOSIS — L578 Other skin changes due to chronic exposure to nonionizing radiation: Secondary | ICD-10-CM | POA: Diagnosis not present

## 2017-03-18 DIAGNOSIS — L821 Other seborrheic keratosis: Secondary | ICD-10-CM | POA: Diagnosis not present

## 2017-03-18 DIAGNOSIS — L905 Scar conditions and fibrosis of skin: Secondary | ICD-10-CM | POA: Diagnosis not present

## 2017-03-18 DIAGNOSIS — D229 Melanocytic nevi, unspecified: Secondary | ICD-10-CM | POA: Diagnosis not present

## 2017-03-22 DIAGNOSIS — E039 Hypothyroidism, unspecified: Secondary | ICD-10-CM | POA: Diagnosis not present

## 2017-03-22 DIAGNOSIS — I10 Essential (primary) hypertension: Secondary | ICD-10-CM | POA: Diagnosis not present

## 2017-03-22 DIAGNOSIS — I6523 Occlusion and stenosis of bilateral carotid arteries: Secondary | ICD-10-CM | POA: Diagnosis not present

## 2017-03-22 DIAGNOSIS — Z79899 Other long term (current) drug therapy: Secondary | ICD-10-CM | POA: Diagnosis not present

## 2017-03-22 DIAGNOSIS — K219 Gastro-esophageal reflux disease without esophagitis: Secondary | ICD-10-CM | POA: Diagnosis not present

## 2017-03-22 DIAGNOSIS — E782 Mixed hyperlipidemia: Secondary | ICD-10-CM | POA: Diagnosis not present

## 2017-03-22 DIAGNOSIS — E119 Type 2 diabetes mellitus without complications: Secondary | ICD-10-CM | POA: Diagnosis not present

## 2017-03-22 DIAGNOSIS — Z0000 Encounter for general adult medical examination without abnormal findings: Secondary | ICD-10-CM | POA: Diagnosis not present

## 2017-03-24 ENCOUNTER — Ambulatory Visit: Payer: PPO

## 2017-03-25 ENCOUNTER — Ambulatory Visit: Payer: PPO | Admitting: Urology

## 2017-03-25 ENCOUNTER — Encounter: Payer: Self-pay | Admitting: Urology

## 2017-03-25 VITALS — BP 182/70 | HR 62 | Ht 62.0 in | Wt 150.1 lb

## 2017-03-25 DIAGNOSIS — N3946 Mixed incontinence: Secondary | ICD-10-CM | POA: Diagnosis not present

## 2017-03-25 NOTE — Progress Notes (Signed)
03/25/2017 9:44 AM   [name] 1928-02-10 [id number]  Referring provider: Idelle Crouch, MD [place] [facility] [place], [place] [address]  Chief Complaint  Patient presents with  . PTNS    Mixed incontinence    HPI: 83 yo WF with a history of hematuria, nocturia and mixed incontinence who presents to initiate PTNS treatment. This will be #10/12 weekly treatments.       PMH: Past Medical History:  Diagnosis Date  . Cancer ([place])    skin  . Heart murmur   . Heartburn   . HLD (hyperlipidemia)   . HTN (hypertension)   . Hyperthyroidism     Surgical History: Past Surgical History:  Procedure Laterality Date  . broken foot    . EXCISIONAL HEMORRHOIDECTOMY    . GALLBLADDER SURGERY    . MEDIAL PARTIAL KNEE REPLACEMENT    . TOTAL HIP ARTHROPLASTY Right    from crushed hip  . VAGINAL HYSTERECTOMY  1961    Home Medications:  Allergies as of 03/25/2017   No Known Allergies     Medication List       Accurate as of 03/25/17  9:44 AM. Always use your most recent med list.          aspirin EC 81 MG tablet Take by mouth.   carvedilol 6.25 MG tablet Commonly known as:  COREG Take by mouth.   cloNIDine 0.1 MG tablet Commonly known as:  CATAPRES 1 tablet each AM, 2 tablets each afternoon and 1 tablet at bedtime   conjugated estrogens vaginal cream Commonly known as:  PREMARIN Place 0.5 Applicatorfuls vaginally 2 (two) times a week.   DIGESTIVE ENZYMES PO Take by mouth.   fesoterodine 8 MG Tb24 tablet Commonly known as:  TOVIAZ Take by mouth.   LACTOBACILLUS BIFIDUS PO Take by mouth.   levothyroxine 100 MCG tablet Commonly known as:  SYNTHROID, LEVOTHROID TAKE 1 TABLET (100 MCG TOTAL) BY MOUTH ONCE DAILY. TAKE ON AN EMPTY STOMACH WITH A GLASS OF WATER AT LEAST 30-60 MINUTES BEFORE BREAKFAST.   pantoprazole 40 MG tablet Commonly known as:  PROTONIX Take 40 mg by mouth daily.   PROBIOTIC PO Take by mouth.   ranitidine 300  MG tablet Commonly known as:  ZANTAC Take by mouth.   ranitidine 300 MG tablet Commonly known as:  ZANTAC TAKE ONE TABLET BY MOUTH EVERY NIGHT   SM CALCIUM 500/VITAMIN D3 500-400 MG-UNIT tablet Generic drug:  calcium-vitamin D Take by mouth.   solifenacin 5 MG tablet Commonly known as:  VESICARE Take by mouth.   sucralfate 1 g tablet Commonly known as:  CARAFATE Take by mouth 2 (two) times daily.   telmisartan 80 MG tablet Commonly known as:  MICARDIS Take by mouth.   Vitamin D (Ergocalciferol) 50000 units Caps capsule Commonly known as:  DRISDOL TAKE 1 CAPSULE BY MOUTH ONCE A WEEK       Allergies: No Known Allergies  Family History: Family History  Problem Relation Age of Onset  . Cancer Sister 57       stomach  . Prostate cancer Brother   . Prostate cancer Brother   . Kidney cancer Neg Hx   . Bladder Cancer Neg Hx     Social History:  reports that she has never smoked. She has never used smokeless tobacco. She reports that she drinks alcohol. She reports that she does not use drugs.  ROS: UROLOGY Frequent Urination?: No Hard to postpone urination?: No  Burning/pain with urination?: No Get up at night to urinate?: Yes Leakage of urine?: Yes Urine stream starts and stops?: No Trouble starting stream?: No Do you have to strain to urinate?: No Blood in urine?: No Urinary tract infection?: No Sexually transmitted disease?: No Injury to kidneys or bladder?: No Painful intercourse?: No Weak stream?: No Currently pregnant?: No Vaginal bleeding?: No Last menstrual period?: n  Gastrointestinal Nausea?: No Vomiting?: No Indigestion/heartburn?: No Diarrhea?: No Constipation?: No  Constitutional Fever: No Night sweats?: No Weight loss?: No Fatigue?: No  Skin Skin rash/lesions?: No Itching?: No  Eyes Blurred vision?: No Double vision?: No  Ears/Nose/Throat Sore throat?: No Sinus problems?: No  Hematologic/Lymphatic Swollen glands?:  No Easy bruising?: No  Cardiovascular Leg swelling?: No Chest pain?: No  Respiratory Cough?: No Shortness of breath?: No  Endocrine Excessive thirst?: No  Musculoskeletal Back pain?: No Joint pain?: No  Neurological Headaches?: No Dizziness?: No  Psychologic Depression?: No Anxiety?: No  Physical Exam: BP (!) 182/70   Pulse 62   Ht 5\' 2"  (1.575 m)   Wt 150 lb 1.6 oz (68.1 kg)   BMI 27.45 kg/m   Constitutional:  Alert and oriented, No acute distress. HEENT: [place] AT, moist mucus membranes.  Trachea midline, no masses. Cardiovascular: No clubbing, cyanosis, or edema. Respiratory: Normal respiratory effort, no increased work of breathing. GI: Abdomen is soft, nontender, nondistended, no abdominal masses GU: No CVA tenderness.  Skin: No rashes, bruises or suspicious lesions. Lymph: No cervical or inguinal adenopathy. Neurologic: Grossly intact, no focal deficits, moving all 4 extremities. Psychiatric: Normal mood and affect.  Laboratory Data: Lab Results  Component Value Date   WBC 8.6 07/09/2013   HGB 13.5 07/09/2013   HCT 38.8 07/09/2013   MCV 91 07/09/2013   PLT 273 07/09/2013    Lab Results  Component Value Date   CREATININE 0.99 09/23/2016    No results found for: PSA  No results found for: TESTOSTERONE  Lab Results  Component Value Date   HGBA1C 5.9 06/09/2013    Urinalysis    Component Value Date/Time   COLORURINE Yellow 06/09/2013 1556   APPEARANCEUR Clear 11/17/2016 1114   LABSPEC 1.020 06/09/2013 1556   PHURINE 6.0 06/09/2013 1556   GLUCOSEU Negative 11/17/2016 1114   GLUCOSEU Negative 06/09/2013 1556   HGBUR Negative 06/09/2013 1556   BILIRUBINUR Negative 11/17/2016 1114   BILIRUBINUR Negative 06/09/2013 1556   KETONESUR Negative 06/09/2013 1556   PROTEINUR Negative 11/17/2016 1114   PROTEINUR 30 mg/dL 06/09/2013 1556   NITRITE Negative 11/17/2016 1114   NITRITE Negative 06/09/2013 1556   LEUKOCYTESUR Negative 11/17/2016 1114    LEUKOCYTESUR Negative 06/09/2013 1556    PTNS treatment: The needle electrode was inserted into the lower, inner aspect of the patient's right leg. The surface electrode was placed on the inside arch of the foot on the treatment leg. The lead set was connected to the stimulator and the needle electrode clip was connected to the needle electrode. The stimulator that produces an adjustable electrical pulse that travels to the sacral nerve plexus via the tibial nerve was increased to 6 until the patient received a sensory response.    Assessment & Plan:    1. Mixed incontinence -follow up one week for next [place], [place] [address] [place], [place] [address] [phone]

## 2017-03-25 NOTE — Progress Notes (Signed)
PTNS  Session # 10  Health & Social Factors: No Change Caffeine: 1 Alcohol: 0-1 Daytime voids #per day: 7 Night-time voids #per night: 2 Urgency: None Incontinence Episodes #per day: 0 Ankle used: Right Treatment Setting: 6 Feeling/ Response: Sensory  Preformed By: Baruch Gouty MD   Assistant: Lyndee Hensen CMA  Follow Up: One week

## 2017-03-30 NOTE — Progress Notes (Signed)
03/31/2017 2:02 PM   Erica Lane [birth year] [id number]  Referring provider: Idelle Crouch, MD [facility] [place], [place] [address]  Chief Complaint  Patient presents with  . PTNS    mixed incontinence    HPI: 83 yo WF with a history of hematuria, nocturia and mixed incontinence who presents to initiate PTNS treatment. This will be #11/12 weekly treatments.    Background history Patient completed hematuria workup on 11/17/2016.  CTU and cystoscopy did not identify any worrisome findings, but she did have a small right lower pole stone.   She is having nocturia and incontinence.  She states the urine just leaks out.  She states she was on the Vesicare for one month and did not find it effective.  She wears pads continually.  She has not had dysuria, gross hematuria or suprapubic pain.  She is wearing POISE pads medium size and she leaks through the pads.  She wears pull ups at night.    She also had a trial of Toviaz and Myrbetriq, both medication she found it ineffective.  Contraindications present for PTNS      Pacemaker      Implantable defibrillator      History of abnormal bleeding      History of neuropathies or nerve damage  Discussed with patient possible complications of procedure, such as discomfort, bleeding at insertion/stimulation site, procedure consent signed  Patient goals:     - patient is wanting to see a reduction in her frequency and nocturia.   Baseline symptoms are urgency, she states that she is wearing a pad most of the time, frequency x 4-7, not restricting fluids to avoid visits to the restroom, is engaging in toilet mapping, incontinence x 4-7  and nocturia x 3.    She was seen and evaluated by Dr. Enzo Bi for pessary fitting. Unfortunately, a pessary could not be fitted due to her narrow introitus. She is advised to try the over-the-counter POISE pessary area or tampon.  She has tried inserting a tampon,  but she did not find any benefit and it was uncomfortable.    She is using the vaginal estrogen cream 3 days weekly.   Over the last week, she is experiencing 7 daytime voids (stable), 2 nighttime voids (stable), no urgency and 0 incontinence episodes daily (stable).  She is not experiencing dysuria, gross hematuria suprapubic pain.  She is not experiencing fever chills nausea or vomiting.    PMH: Past Medical History:  Diagnosis Date  . Cancer ([place])    skin  . Heart murmur   . Heartburn   . HLD (hyperlipidemia)   . HTN (hypertension)   . Hyperthyroidism     Surgical History: Past Surgical History:  Procedure Laterality Date  . broken foot    . EXCISIONAL HEMORRHOIDECTOMY    . GALLBLADDER SURGERY    . MEDIAL PARTIAL KNEE REPLACEMENT    . TOTAL HIP ARTHROPLASTY Right    from crushed hip  . VAGINAL HYSTERECTOMY  1961    Home Medications:  Allergies as of 03/31/2017   No Known Allergies     Medication List       Accurate as of 03/31/17  2:02 PM. Always use your most recent med list.          aspirin EC 81 MG tablet Take by mouth.   carvedilol 6.25 MG tablet Commonly known as:  COREG Take by mouth.   cloNIDine 0.1 MG tablet  Commonly known as:  CATAPRES 1 tablet each AM, 2 tablets each afternoon and 1 tablet at bedtime   conjugated estrogens vaginal cream Commonly known as:  PREMARIN Place 0.5 Applicatorfuls vaginally 2 (two) times a week.   DIGESTIVE ENZYMES PO Take by mouth.   fesoterodine 8 MG Tb24 tablet Commonly known as:  TOVIAZ Take by mouth.   LACTOBACILLUS BIFIDUS PO Take by mouth.   levothyroxine 100 MCG tablet Commonly known as:  SYNTHROID, LEVOTHROID TAKE 1 TABLET (100 MCG TOTAL) BY MOUTH ONCE DAILY. TAKE ON AN EMPTY STOMACH WITH A GLASS OF WATER AT LEAST 30-60 MINUTES BEFORE BREAKFAST.   pantoprazole 40 MG tablet Commonly known as:  PROTONIX Take 40 mg by mouth daily.   PROBIOTIC PO Take by mouth.   ranitidine 300 MG  tablet Commonly known as:  ZANTAC Take by mouth.   ranitidine 300 MG tablet Commonly known as:  ZANTAC TAKE ONE TABLET BY MOUTH EVERY NIGHT   SM CALCIUM 500/VITAMIN D3 500-400 MG-UNIT tablet Generic drug:  calcium-vitamin D Take by mouth.   solifenacin 5 MG tablet Commonly known as:  VESICARE Take by mouth.   sucralfate 1 g tablet Commonly known as:  CARAFATE Take by mouth 2 (two) times daily.   telmisartan 80 MG tablet Commonly known as:  MICARDIS Take by mouth.   Vitamin D (Ergocalciferol) 50000 units Caps capsule Commonly known as:  DRISDOL TAKE 1 CAPSULE BY MOUTH ONCE A WEEK       Allergies: No Known Allergies  Family History: Family History  Problem Relation Age of Onset  . Cancer Sister 84       stomach  . Prostate cancer Brother   . Prostate cancer Brother   . Kidney cancer Neg Hx   . Bladder Cancer Neg Hx     Social History:  reports that she has never smoked. She has never used smokeless tobacco. She reports that she drinks alcohol. She reports that she does not use drugs.  ROS: UROLOGY Frequent Urination?: No Hard to postpone urination?: No Burning/pain with urination?: No Get up at night to urinate?: Yes Leakage of urine?: Yes Urine stream starts and stops?: No Trouble starting stream?: No Do you have to strain to urinate?: No Blood in urine?: No Urinary tract infection?: No Sexually transmitted disease?: No Injury to kidneys or bladder?: No Painful intercourse?: No Weak stream?: No Currently pregnant?: No Vaginal bleeding?: No Last menstrual period?: n  Gastrointestinal Nausea?: No Vomiting?: No Indigestion/heartburn?: No Diarrhea?: No Constipation?: No  Constitutional Fever: No Night sweats?: No Weight loss?: No Fatigue?: Yes  Skin Skin rash/lesions?: No Itching?: No  Eyes Blurred vision?: No Double vision?: No  Ears/Nose/Throat Sore throat?: No Sinus problems?: No  Hematologic/Lymphatic Swollen glands?: No Easy  bruising?: No  Cardiovascular Leg swelling?: No Chest pain?: No  Respiratory Cough?: No Shortness of breath?: No  Endocrine Excessive thirst?: No  Musculoskeletal Back pain?: No Joint pain?: No  Neurological Headaches?: No Dizziness?: No  Psychologic Depression?: No Anxiety?: No  Physical Exam: BP (!) 160/78   Pulse 61   Ht 5\' 2"  (1.575 m)   Wt 149 lb 3.2 oz (67.7 kg)   BMI 27.29 kg/m   Constitutional: Well nourished. Alert and oriented, No acute distress. HEENT: [place] AT, moist mucus membranes. Trachea midline, no masses. Cardiovascular: No clubbing, cyanosis, or edema. Respiratory: Normal respiratory effort, no increased work of breathing. Skin: No rashes, bruises or suspicious lesions. Lymph: No cervical or inguinal adenopathy. Neurologic: Grossly intact, no focal  deficits, moving all 4 extremities. Psychiatric: Normal mood and affect.  Laboratory Data:  PTNS treatment: The needle electrode was inserted into the lower, inner aspect of the patient's left leg. The surface electrode was placed on the inside arch of the foot on the treatment leg. The lead set was connected to the stimulator and the needle electrode clip was connected to the needle electrode. The stimulator that produces an adjustable electrical pulse that travels to the sacral nerve plexus via the tibial nerve was increased to 11 until the patient received a sensory response.     Assessment & Plan:     1. Mixed incontinence  - offered behavioral therapies, bladder training, bladder control strategies and pelvic floor muscle training - pt deferred PT  - fluid management   - offered refer to gynecology for a pessary fitting - pessary could not be fitted    Treatment Plan:    The needle electrode was removed without difficulty to the patient.  Patient tolerated the procedure for 30 minutes.  She will return next week for # 12 out of 12 of their weekly PTNS treatment's  2. Microscopic hematuria  -  RTC in one year for UA (11/2017)  - report any gross hematuria  3. Right renal stone  - KUB in one year (11/2017)  - Advised to contact our office or seek treatment in the ED if becomes febrile or pain/ vomiting are difficult control in order to arrange for emergent/urgent intervention   4. Nocturia  - she is starting to wear pads at night now  - undergoing PTNS   Return for # 12 PTNS.   These notes generated with voice recognition software. I apologize for typographical errors.  Zara Council, [facility] [address] [place], [place] [address] [phone]

## 2017-03-31 ENCOUNTER — Ambulatory Visit (INDEPENDENT_AMBULATORY_CARE_PROVIDER_SITE_OTHER): Payer: PPO | Admitting: Urology

## 2017-03-31 ENCOUNTER — Encounter: Payer: Self-pay | Admitting: Urology

## 2017-03-31 VITALS — BP 160/78 | HR 61 | Ht 62.0 in | Wt 149.2 lb

## 2017-03-31 DIAGNOSIS — N3946 Mixed incontinence: Secondary | ICD-10-CM | POA: Diagnosis not present

## 2017-03-31 DIAGNOSIS — R351 Nocturia: Secondary | ICD-10-CM

## 2017-03-31 NOTE — Progress Notes (Signed)
PTNS  Session # 11  Health & Social Factors: No Change Caffeine: 1 Alcohol: 0-1 Daytime voids #per day: 7 Night-time voids #per night: 2 Urgency: None Incontinence Episodes #per day: 0 Ankle used: Left Treatment Setting: 11 Feeling/ Response: Sensory  Preformed By: Zara Council PA-C  Assistant:Ramona Williams CMA  Follow Up: One week

## 2017-04-06 NOTE — Progress Notes (Signed)
04/07/2017 11:08 AM   Erica Lane Feb 08, 1928 [id number]  Referring provider: Idelle Crouch, MD [facility] [place],  [address]  Chief Complaint  Patient presents with  . PTNS    mixed incontinence    HPI: 83 yo WF with a history of hematuria, nocturia and mixed incontinence who presents to initiate PTNS treatment. This will be #12/12 weekly treatments.    Background history Patient completed hematuria workup on 11/17/2016.  CTU and cystoscopy did not identify any worrisome findings, but she did have a small right lower pole stone.   She is having nocturia and incontinence.  She states the urine just leaks out.  She states she was on the Vesicare for one month and did not find it effective.  She wears pads continually.  She has not had dysuria, gross hematuria or suprapubic pain.  She is wearing POISE pads medium size and she leaks through the pads.  She wears pull ups at night.    She also had a trial of Toviaz and Myrbetriq, both medication she found it ineffective.  Contraindications present for PTNS      Pacemaker      Implantable defibrillator      History of abnormal bleeding      History of neuropathies or nerve damage  Discussed with patient possible complications of procedure, such as discomfort, bleeding at insertion/stimulation site, procedure consent signed  Patient goals:     - patient is wanting to see a reduction in her frequency and nocturia.   Baseline symptoms are urgency, she states that she is wearing a pad most of the time, frequency x 4-7, not restricting fluids to avoid visits to the restroom, is engaging in toilet mapping, incontinence x 4-7  and nocturia x 3.    She was seen and evaluated by Dr. Enzo Bi for pessary fitting. Unfortunately, a pessary could not be fitted due to her narrow introitus. She is advised to try the over-the-counter POISE pessary area or tampon.  She has tried inserting a tampon,  but she did not find any benefit and it was uncomfortable.    She is using the vaginal estrogen cream 3 days weekly.   Today, she is complaining of incontinence.  Over the last week, she is experiencing 6 daytime voids (improved), 2 nighttime voids (stable), no urgency and 0 incontinence episodes daily (stable).  She is not experiencing dysuria, gross hematuria suprapubic pain.  She is not experiencing fever chills nausea or vomiting.    PMH: Past Medical History:  Diagnosis Date  . Cancer ([place])    skin  . Heart murmur   . Heartburn   . HLD (hyperlipidemia)   . HTN (hypertension)   . Hyperthyroidism     Surgical History: Past Surgical History:  Procedure Laterality Date  . broken foot    . EXCISIONAL HEMORRHOIDECTOMY    . GALLBLADDER SURGERY    . MEDIAL PARTIAL KNEE REPLACEMENT    . TOTAL HIP ARTHROPLASTY Right    from crushed hip  . VAGINAL HYSTERECTOMY  1961    Home Medications:  Allergies as of 04/07/2017   No Known Allergies     Medication List       Accurate as of 04/07/17 11:08 AM. Always use your most recent med list.          aspirin EC 81 MG tablet Take by mouth.   carvedilol 6.25 MG tablet Commonly known as:  COREG Take by mouth.  cloNIDine 0.1 MG tablet Commonly known as:  CATAPRES 1 tablet each AM, 2 tablets each afternoon and 1 tablet at bedtime   conjugated estrogens vaginal cream Commonly known as:  PREMARIN Place 0.5 Applicatorfuls vaginally 2 (two) times a week.   DIGESTIVE ENZYMES PO Take by mouth.   fesoterodine 8 MG Tb24 tablet Commonly known as:  TOVIAZ Take by mouth.   LACTOBACILLUS BIFIDUS PO Take by mouth.   levothyroxine 100 MCG tablet Commonly known as:  SYNTHROID, LEVOTHROID TAKE 1 TABLET (100 MCG TOTAL) BY MOUTH ONCE DAILY. TAKE ON AN EMPTY STOMACH WITH A GLASS OF WATER AT LEAST 30-60 MINUTES BEFORE BREAKFAST.   pantoprazole 40 MG tablet Commonly known as:  PROTONIX Take 40 mg by mouth daily.   PROBIOTIC PO Take  by mouth.   ranitidine 300 MG tablet Commonly known as:  ZANTAC Take by mouth.   ranitidine 300 MG tablet Commonly known as:  ZANTAC TAKE ONE TABLET BY MOUTH EVERY NIGHT   SM CALCIUM 500/VITAMIN D3 500-400 MG-UNIT tablet Generic drug:  calcium-vitamin D Take by mouth.   solifenacin 5 MG tablet Commonly known as:  VESICARE Take by mouth.   sucralfate 1 g tablet Commonly known as:  CARAFATE Take by mouth 2 (two) times daily.   telmisartan 80 MG tablet Commonly known as:  MICARDIS Take by mouth.   Vitamin D (Ergocalciferol) 50000 units Caps capsule Commonly known as:  DRISDOL TAKE 1 CAPSULE BY MOUTH ONCE A WEEK            Discharge Care Instructions        Start     Ordered   04/07/17 0000  PTNS-Percutaneous Tibial Nerve Stimulati    Question:  Porcedure Location  Answer:  [facility]   04/07/17 1017      Allergies: No Known Allergies  Family History: Family History  Problem Relation Age of Onset  . Cancer Sister 35       stomach  . Prostate cancer Brother   . Prostate cancer Brother   . Kidney cancer Neg Hx   . Bladder Cancer Neg Hx     Social History:  reports that she has never smoked. She has never used smokeless tobacco. She reports that she drinks alcohol. She reports that she does not use drugs.  ROS: UROLOGY Frequent Urination?: No Hard to postpone urination?: No Burning/pain with urination?: No Get up at night to urinate?: No Leakage of urine?: Yes Urine stream starts and stops?: No Trouble starting stream?: No Do you have to strain to urinate?: No Blood in urine?: No Urinary tract infection?: No Sexually transmitted disease?: No Injury to kidneys or bladder?: No Painful intercourse?: No Weak stream?: No Currently pregnant?: No Vaginal bleeding?: No Last menstrual period?: n  Gastrointestinal Nausea?: No Vomiting?: No Indigestion/heartburn?: No Diarrhea?: No Constipation?: No  Constitutional Fever:  No Night sweats?: No Weight loss?: No Fatigue?: Yes  Skin Skin rash/lesions?: No Itching?: No  Eyes Blurred vision?: No Double vision?: No  Ears/Nose/Throat Sore throat?: No Sinus problems?: No  Hematologic/Lymphatic Swollen glands?: No Easy bruising?: Yes  Cardiovascular Leg swelling?: No Chest pain?: No  Respiratory Cough?: No Shortness of breath?: No  Endocrine Excessive thirst?: No  Musculoskeletal Back pain?: No Joint pain?: No  Neurological Headaches?: No Dizziness?: No  Psychologic Depression?: No Anxiety?: No  Physical Exam: BP (!) 147/77   Pulse (!) 56   Ht 5\' 2"  (1.575 m)   Wt 150 lb 4.8 oz (68.2 kg)  BMI 27.49 kg/m   Constitutional: Well nourished. Alert and oriented, No acute distress. HEENT: [place] AT, moist mucus membranes. Trachea midline, no masses. Cardiovascular: No clubbing, cyanosis, or edema. Respiratory: Normal respiratory effort, no increased work of breathing. Skin: No rashes, bruises or suspicious lesions. Lymph: No cervical or inguinal adenopathy. Neurologic: Grossly intact, no focal deficits, moving all 4 extremities. Psychiatric: Normal mood and affect.  Laboratory Data:  PTNS treatment: The needle electrode was inserted into the lower, inner aspect of the patient's left leg. The surface electrode was placed on the inside arch of the foot on the treatment leg. The lead set was connected to the stimulator and the needle electrode clip was connected to the needle electrode. The stimulator that produces an adjustable electrical pulse that travels to the sacral nerve plexus via the tibial nerve was increased to 7 until the patient received a sensory response.     Assessment & Plan:     1. Mixed incontinence  - offered behavioral therapies, bladder training, bladder control strategies and pelvic floor muscle training - pt deferred PT  - fluid management   - offered refer to gynecology for a pessary fitting - pessary could  not be fitted    Treatment Plan:    The needle electrode was removed without difficulty to the patient.  Patient tolerated the procedure for 30 minutes.  She will return next month for maintenance PTNS treatment  2. Microscopic hematuria  - RTC in one year for UA (11/2017)  - report any gross hematuria  3. Right renal stone  - KUB in one year (11/2017)  - Advised to contact our office or seek treatment in the ED if becomes febrile or pain/ vomiting are difficult control in order to arrange for emergent/urgent intervention   4. Nocturia  - she is starting to wear pads at night now  - undergoing PTNS   Return in about 1 month (around 05/08/2017) for Maintenance PTNS.   These notes generated with voice recognition software. I apologize for typographical errors.  Zara Council, [facility] [address] [place], [place] [address] [phone]

## 2017-04-07 ENCOUNTER — Encounter: Payer: Self-pay | Admitting: Urology

## 2017-04-07 ENCOUNTER — Ambulatory Visit: Payer: PPO | Admitting: Urology

## 2017-04-07 VITALS — BP 147/77 | HR 56 | Ht 62.0 in | Wt 150.3 lb

## 2017-04-07 DIAGNOSIS — R351 Nocturia: Secondary | ICD-10-CM | POA: Diagnosis not present

## 2017-04-07 DIAGNOSIS — N3946 Mixed incontinence: Secondary | ICD-10-CM

## 2017-04-07 NOTE — Progress Notes (Signed)
PTNS  Session # 12  Health & Social Factors: No Change  Caffeine: 1 Alcohol: 0-1 Daytime voids #per day: 6 Night-time voids #per night:2  Urgency: None Incontinence Episodes #per day: 0 Ankle used: Left Treatment Setting: 7 Feeling/ Response: Sensory  Preformed By: Zara Council PA-C   Assistant: Lyndee Hensen CMA  Follow Up: One Month

## 2017-04-15 ENCOUNTER — Ambulatory Visit (INDEPENDENT_AMBULATORY_CARE_PROVIDER_SITE_OTHER): Payer: PPO | Admitting: Obstetrics and Gynecology

## 2017-04-15 ENCOUNTER — Encounter: Payer: Self-pay | Admitting: Obstetrics and Gynecology

## 2017-04-15 VITALS — BP 164/83 | HR 70 | Ht 62.0 in | Wt 151.4 lb

## 2017-04-15 DIAGNOSIS — N952 Postmenopausal atrophic vaginitis: Secondary | ICD-10-CM

## 2017-04-15 DIAGNOSIS — N3946 Mixed incontinence: Secondary | ICD-10-CM | POA: Diagnosis not present

## 2017-04-15 NOTE — Patient Instructions (Signed)
1.  Continue with Premarin cream intravaginally twice weekly. 2.  Consider poise impressa vaginal inserts To help with incontinence. 3.  Return in 6 months for follow-up

## 2017-04-15 NOTE — Progress Notes (Signed)
Chief complaint: 1.  Vaginal atrophy. 2.  Urinary incontinence.  The patient presents for 3 month follow-up.  She is using Premarin cream intravaginal twice weekly with success.She has noted minimal improvement in dryness symptoms.  She is not sexually active.  Urinary incontinence continues.  She is unable to use tampons.  She did not use the poise impressa inserts. She has completed the posterior tibial nerve stimulation for unstable bladder but has not noted any significant improvement.  She continues to go to the bathroom 2 times at night.  Past medical history, past surgical history, problem l.  OBJECTIVE: BP (!) 164/83   Pulse 70   Ht 5\' 2"  (1.575 m)   Wt 151 lb 6.4 oz (68.7 kg)   BMI 27.69 kg/m  Pleasant, well-appearing, elderly female in no acute ABDOMEN: Soft, non distended; Non tender.  No Organomegaly. PELVIC:             External Genitalia: Fair estrogen effect changes; the introitus is narrowed; no lesions             BUS: Urethral caruncle present             Vagina: Mild to moderate atrophy; first-degree cystocele without rotational descent of bladder neck with valsalva; no rectocele             Cervix: Surgically absent             Uterus: Surgically absent             Adnexa: Normal; nonpalpable nontender             RV: Normal external exam             Bladder: Nontender  ASSESSMENT: 1.  Mixed incontinence; status post posterior tibial nerve stimulation treatment without success; status post medication therapy without success 2.  Vaginal atrophy, improved with estrogen cream therapy.  PLAN: 1.  Continue Premarin cream intravaginal 2 times a week 2.  Recommend timed voiding every 2 hours to decrease Bladder volume and leakage 3.  Return in 6 months for follow-up  A total of 15 minutes were spent face-to-face with the patient during this encounter and over half of that time dealt with counseling and coordination of care.  Brayton Mars, MD  Note: This  dictation was prepared with Dragon dictation along with smaller phrase technology. Any transcriptional errors that result from this process are unintentional.

## 2017-04-16 DIAGNOSIS — I6523 Occlusion and stenosis of bilateral carotid arteries: Secondary | ICD-10-CM | POA: Diagnosis not present

## 2017-05-06 NOTE — Progress Notes (Signed)
05/10/2017 3:32 PM   [name] [birth year] [id number]  Referring provider: Idelle Crouch, MD [place] [facility] [place], [place] [address]  Chief Complaint  Patient presents with  . PTNS    Mixed incontinence    HPI: 83 yo WF with a history of hematuria, nocturia and mixed incontinence who presents today for PTNS treatment. This will be a maintenance PTNS.    Background history Patient completed hematuria workup on 11/17/2016.  CTU and cystoscopy did not identify any worrisome findings, but she did have a small right lower pole stone.   She is having nocturia and incontinence.  She states the urine just leaks out.  She has failed Vesicare, Toviaz and Myrbetriq.  She also felt that the tampon was too irritating to wear.  She is now wearing three panty liners daily and a POISE pad when she goes on outings. She has not had dysuria, gross hematuria or suprapubic pain.  She is not longer wearing pull ups at night.    She also had a trial of Toviaz and Myrbetriq, both medication she found it ineffective.  Contraindications present for PTNS      Pacemaker - NO      Implantable defibrillator - NO      History of abnormal bleeding - NO      History of neuropathies or nerve damage - NO   Patient goals:     - patient now wants to be completely dry  Baseline symptoms are urgency, she states that she is wearing a pad most of the time, frequency x 4-7, not restricting fluids to avoid visits to the restroom, is engaging in toilet mapping, incontinence x 4-7  and nocturia x 3.    She is using the vaginal estrogen cream 3 days weekly.   Today, she is complaining of incontinence and nocturia.  Over the last week, she is experiencing 6 daytime voids (Stable), 2 nighttime voids (stable), no urgency (stable) and 0 incontinence episodes daily (stable).  She is not experiencing dysuria, gross hematuria suprapubic pain.  She is not experiencing fever chills nausea or  vomiting.    PMH: Past Medical History:  Diagnosis Date  . Cancer ([place])    skin  . Heart murmur   . Heartburn   . HLD (hyperlipidemia)   . HTN (hypertension)   . Hyperthyroidism     Surgical History: Past Surgical History:  Procedure Laterality Date  . broken foot    . EXCISIONAL HEMORRHOIDECTOMY    . GALLBLADDER SURGERY    . MEDIAL PARTIAL KNEE REPLACEMENT    . TOTAL HIP ARTHROPLASTY Right    from crushed hip  . VAGINAL HYSTERECTOMY  1961    Home Medications:  Allergies as of 05/10/2017   No Known Allergies     Medication List       Accurate as of 05/10/17 11:59 PM. Always use your most recent med list.          aspirin EC 81 MG tablet Take by mouth.   carvedilol 6.25 MG tablet Commonly known as:  COREG Take by mouth.   cloNIDine 0.1 MG tablet Commonly known as:  CATAPRES 1 tablet each AM, 2 tablets each afternoon and 1 tablet at bedtime   conjugated estrogens vaginal cream Commonly known as:  PREMARIN Place 0.5 Applicatorfuls vaginally 2 (two) times a week.   DIGESTIVE ENZYMES PO Take by mouth.   LACTOBACILLUS BIFIDUS PO Take by mouth.   levothyroxine 100  MCG tablet Commonly known as:  SYNTHROID, LEVOTHROID TAKE 1 TABLET (100 MCG TOTAL) BY MOUTH ONCE DAILY. TAKE ON AN EMPTY STOMACH WITH A GLASS OF WATER AT LEAST 30-60 MINUTES BEFORE BREAKFAST.   mirabegron ER 25 MG Tb24 tablet Commonly known as:  MYRBETRIQ Take 1 tablet (25 mg total) by mouth daily.   pantoprazole 40 MG tablet Commonly known as:  PROTONIX Take 40 mg by mouth daily.   PROBIOTIC PO Take by mouth.   ranitidine 300 MG tablet Commonly known as:  ZANTAC TAKE ONE TABLET BY MOUTH EVERY NIGHT   SM CALCIUM 500/VITAMIN D3 500-400 MG-UNIT tablet Generic drug:  calcium-vitamin D Take by mouth.   sucralfate 1 g tablet Commonly known as:  CARAFATE Take by mouth 2 (two) times daily.   telmisartan 80 MG tablet Commonly known as:  MICARDIS Take by mouth.   Vitamin D  (Ergocalciferol) 50000 units Caps capsule Commonly known as:  DRISDOL TAKE 1 CAPSULE BY MOUTH ONCE A WEEK       Allergies: No Known Allergies  Family History: Family History  Problem Relation Age of Onset  . Cancer Sister 28       stomach  . Prostate cancer Brother   . Prostate cancer Brother   . Kidney cancer Neg Hx   . Bladder Cancer Neg Hx     Social History:  reports that she has never smoked. She has never used smokeless tobacco. She reports that she drinks alcohol. She reports that she does not use drugs.  ROS: UROLOGY Frequent Urination?: No Hard to postpone urination?: No Burning/pain with urination?: No Get up at night to urinate?: Yes Leakage of urine?: Yes Urine stream starts and stops?: No Trouble starting stream?: No Do you have to strain to urinate?: No Blood in urine?: No Urinary tract infection?: No Sexually transmitted disease?: No Injury to kidneys or bladder?: No Painful intercourse?: No Weak stream?: No Currently pregnant?: No Vaginal bleeding?: No Last menstrual period?: n  Gastrointestinal Nausea?: No Vomiting?: No Indigestion/heartburn?: No Diarrhea?: No Constipation?: No  Constitutional Fever: No Night sweats?: No Weight loss?: No Fatigue?: Yes  Skin Skin rash/lesions?: No Itching?: No  Eyes Blurred vision?: No Double vision?: No  Ears/Nose/Throat Sore throat?: No Sinus problems?: No  Hematologic/Lymphatic Swollen glands?: No Easy bruising?: No  Cardiovascular Leg swelling?: No Chest pain?: No  Respiratory Cough?: No Shortness of breath?: No  Endocrine Excessive thirst?: No  Musculoskeletal Back pain?: No Joint pain?: No  Neurological Headaches?: No Dizziness?: No  Psychologic Depression?: No Anxiety?: No  Physical Exam: BP (!) 148/71   Pulse 66   Ht 5\' 2"  (1.575 m)   Wt 150 lb 14.4 oz (68.4 kg)   BMI 27.60 kg/m   Constitutional: Well nourished. Alert and oriented, No acute distress. HEENT:  [place] AT, moist mucus membranes. Trachea midline, no masses. Cardiovascular: No clubbing, cyanosis, or edema. Respiratory: Normal respiratory effort, no increased work of breathing. Skin: No rashes, bruises or suspicious lesions. Lymph: No cervical or inguinal adenopathy. Neurologic: Grossly intact, no focal deficits, moving all 4 extremities. Psychiatric: Normal mood and affect.  Laboratory Data:  PTNS treatment: The needle electrode was inserted into the lower, inner aspect of the patient's right leg. The surface electrode was placed on the inside arch of the foot on the treatment leg. The lead set was connected to the stimulator and the needle electrode clip was connected to the needle electrode. The stimulator that produces an adjustable electrical pulse that travels to the sacral  nerve plexus via the tibial nerve was increased to 19 until the patient received a sensory response.     Assessment & Plan:     1. Mixed incontinence  - Continue maintenance PTNS  - Patient would like to try Myrbetriq again - script sent to pharmacy for Myrbetriq 25 mg daily   Treatment Plan:    The needle electrode was removed without difficulty to the patient.  Patient tolerated the procedure for 30 minutes.  She will return next month for maintenance PTNS treatment  2. Microscopic hematuria  - RTC in one year for UA (11/2017)  - report any gross hematuria  3. Right renal stone  - KUB in one year (11/2017)  - Advised to contact our office or seek treatment in the ED if becomes febrile or pain/ vomiting are difficult control in order to arrange for emergent/urgent intervention   4. Nocturia  - no longer wearing pads at night  - undergoing PTNS   Return in about 1 month (around 06/09/2017) for Maintenance PTNS.   These notes generated with voice recognition software. I apologize for typographical errors.  Zara Council, [facility] [address], [place],  [address] [phone]

## 2017-05-10 ENCOUNTER — Encounter: Payer: Self-pay | Admitting: Urology

## 2017-05-10 ENCOUNTER — Ambulatory Visit (INDEPENDENT_AMBULATORY_CARE_PROVIDER_SITE_OTHER): Payer: PPO | Admitting: Urology

## 2017-05-10 VITALS — BP 148/71 | HR 66 | Ht 62.0 in | Wt 150.9 lb

## 2017-05-10 DIAGNOSIS — N3946 Mixed incontinence: Secondary | ICD-10-CM

## 2017-05-10 DIAGNOSIS — R351 Nocturia: Secondary | ICD-10-CM | POA: Diagnosis not present

## 2017-05-10 MED ORDER — MIRABEGRON ER 25 MG PO TB24: 25.0000 mg | ORAL_TABLET | Freq: Every day | ORAL | 11 refills | Status: DC

## 2017-06-07 DIAGNOSIS — Z961 Presence of intraocular lens: Secondary | ICD-10-CM | POA: Diagnosis not present

## 2017-06-07 NOTE — Progress Notes (Signed)
06/08/2017 9:02 AM   [name] 04-21-1928 [id number]  Referring provider: Idelle Crouch, MD [place] [facility], [place] [address]  Chief Complaint  Patient presents with  . PTNS    Mixed Incontinence    HPI: 83 yo WF with a history of hematuria, nocturia and mixed incontinence who presents today for PTNS treatment. This will be a maintenance PTNS.    Background history Patient completed hematuria workup on 11/17/2016.  CTU and cystoscopy did not identify any worrisome findings, but she did have a small right lower pole stone.   She is having nocturia and incontinence.  She states the urine just leaks out.  She has failed Vesicare, Toviaz and Myrbetriq.  She also felt that the tampon was too irritating to wear.  She is now wearing three panty liners daily and a POISE pad when she goes on outings. She has not had dysuria, gross hematuria or suprapubic pain.  She is not longer wearing pull ups at night.    She also had a trial of Toviaz and Myrbetriq, both medication she found it ineffective.  Contraindications present for PTNS      Pacemaker - NO      Implantable defibrillator - NO      History of abnormal bleeding - NO      History of neuropathies or nerve damage - NO   Patient goals:     - patient now wants to be completely dry  Baseline symptoms are urgency, she states that she is wearing a pad most of the time, frequency x 4-7, not restricting fluids to avoid visits to the restroom, is engaging in toilet mapping, incontinence x 4-7  and nocturia x 3.    She is using the vaginal estrogen cream 3 days weekly.   Today, she is complaining of nocturia and incontinence..  Over the last week, she is experiencing 6 daytime voids (stable), 2 nighttime voids (stable), no urgency (stable) and (changes pads several times a day) incontinence episodes daily (worse).  She is not experiencing dysuria, gross hematuria suprapubic pain.  She is not  experiencing fever chills nausea or vomiting.    PMH: Past Medical History:  Diagnosis Date  . Cancer ([place])    skin  . Heart murmur   . Heartburn   . HLD (hyperlipidemia)   . HTN (hypertension)   . Hyperthyroidism     Surgical History: Past Surgical History:  Procedure Laterality Date  . broken foot    . EXCISIONAL HEMORRHOIDECTOMY    . GALLBLADDER SURGERY    . MEDIAL PARTIAL KNEE REPLACEMENT    . TOTAL HIP ARTHROPLASTY Right    from crushed hip  . VAGINAL HYSTERECTOMY  1961    Home Medications:  Allergies as of 06/08/2017   No Known Allergies     Medication List       Accurate as of 06/08/17  9:02 AM. Always use your most recent med list.          aspirin EC 81 MG tablet Take by mouth.   carvedilol 6.25 MG tablet Commonly known as:  COREG Take by mouth.   cloNIDine 0.1 MG tablet Commonly known as:  CATAPRES 1 tablet each AM, 2 tablets each afternoon and 1 tablet at bedtime   conjugated estrogens vaginal cream Commonly known as:  PREMARIN Place 0.5 Applicatorfuls vaginally 2 (two) times a week.   DIGESTIVE ENZYMES PO Take by mouth.   LACTOBACILLUS BIFIDUS PO Take  by mouth.   levothyroxine 100 MCG tablet Commonly known as:  SYNTHROID, LEVOTHROID TAKE 1 TABLET (100 MCG TOTAL) BY MOUTH ONCE DAILY. TAKE ON AN EMPTY STOMACH WITH A GLASS OF WATER AT LEAST 30-60 MINUTES BEFORE BREAKFAST.   mirabegron ER 25 MG Tb24 tablet Commonly known as:  MYRBETRIQ Take 1 tablet (25 mg total) by mouth daily.   pantoprazole 40 MG tablet Commonly known as:  PROTONIX Take 40 mg by mouth daily.   PROBIOTIC PO Take by mouth.   PROBIOTIC-10 Caps Take by mouth.   ranitidine 300 MG tablet Commonly known as:  ZANTAC TAKE ONE TABLET BY MOUTH EVERY NIGHT   SM CALCIUM 500/VITAMIN D3 500-400 MG-UNIT tablet Generic drug:  calcium-vitamin D Take by mouth.   sucralfate 1 g tablet Commonly known as:  CARAFATE Take by mouth 2 (two) times daily.   telmisartan 80 MG  tablet Commonly known as:  MICARDIS Take by mouth.   Vitamin D (Ergocalciferol) 50000 units Caps capsule Commonly known as:  DRISDOL TAKE 1 CAPSULE BY MOUTH ONCE A WEEK       Allergies: No Known Allergies  Family History: Family History  Problem Relation Age of Onset  . Cancer Sister 71       stomach  . Prostate cancer Brother   . Prostate cancer Brother   . Kidney cancer Neg Hx   . Bladder Cancer Neg Hx     Social History:  reports that she has never smoked. She has never used smokeless tobacco. She reports that she drinks alcohol. She reports that she does not use drugs.  ROS: UROLOGY Frequent Urination?: No Hard to postpone urination?: No Burning/pain with urination?: No Get up at night to urinate?: Yes Leakage of urine?: Yes Urine stream starts and stops?: No Trouble starting stream?: No Do you have to strain to urinate?: No Blood in urine?: No Urinary tract infection?: No Sexually transmitted disease?: No Injury to kidneys or bladder?: No Painful intercourse?: No Weak stream?: No Currently pregnant?: No Vaginal bleeding?: No Last menstrual period?: n  Gastrointestinal Nausea?: No Vomiting?: No Indigestion/heartburn?: No Diarrhea?: No Constipation?: No  Constitutional Fever: No Night sweats?: No Weight loss?: No Fatigue?: No  Skin Skin rash/lesions?: No Itching?: No  Eyes Blurred vision?: No Double vision?: No  Ears/Nose/Throat Sore throat?: No Sinus problems?: No  Hematologic/Lymphatic Swollen glands?: No Easy bruising?: No  Cardiovascular Leg swelling?: No Chest pain?: No  Respiratory Cough?: No Shortness of breath?: No  Endocrine Excessive thirst?: No  Musculoskeletal Back pain?: No Joint pain?: No  Neurological Headaches?: No Dizziness?: No  Psychologic Depression?: No Anxiety?: No  Physical Exam: BP (!) 162/74   Pulse 69   Ht 5\' 3"  (1.6 m)   Wt 145 lb 11.2 oz (66.1 kg)   BMI 25.81 kg/m   Constitutional:  Well nourished. Alert and oriented, No acute distress. HEENT: [place] AT, moist mucus membranes. Trachea midline, no masses. Cardiovascular: No clubbing, cyanosis, or edema. Respiratory: Normal respiratory effort, no increased work of breathing. Skin: No rashes, bruises or suspicious lesions. Lymph: No cervical or inguinal adenopathy. Neurologic: Grossly intact, no focal deficits, moving all 4 extremities. Psychiatric: Normal mood and affect.  Laboratory Data:  PTNS treatment: The needle electrode was inserted into the lower, inner aspect of the patient's left leg. The surface electrode was placed on the inside arch of the foot on the treatment leg. The lead set was connected to the stimulator and the needle electrode clip was connected to the needle electrode.  The stimulator that produces an adjustable electrical pulse that travels to the sacral nerve plexus via the tibial nerve was increased to 16 until the patient received a sensory response.     Assessment & Plan:     1. Mixed incontinence  - Continue maintenance PTNS  - Patient would like to try Myrbetriq again - script sent to pharmacy for Myrbetriq 25 mg daily   Treatment Plan:    The needle electrode was removed without difficulty to the patient.  Patient tolerated the procedure for 30 minutes.  She will return next month for maintenance PTNS treatment  2. Microscopic hematuria  - RTC in one year for UA (11/2017)  - report any gross hematuria  3. Right renal stone  - KUB in one year (11/2017)  - Advised to contact our office or seek treatment in the ED if becomes febrile or pain/ vomiting are difficult control in order to arrange for emergent/urgent intervention   4. Nocturia  - no longer wearing pads at night  - undergoing PTNS   Return in about 1 month (around 07/09/2017) for Maintenance PTNS.   These notes generated with voice recognition software. I apologize for typographical errors.  Zara Council,  [facility] [address] [place], [place] [address] [phone]

## 2017-06-08 ENCOUNTER — Ambulatory Visit (INDEPENDENT_AMBULATORY_CARE_PROVIDER_SITE_OTHER): Payer: PPO | Admitting: Urology

## 2017-06-08 ENCOUNTER — Encounter: Payer: Self-pay | Admitting: Urology

## 2017-06-08 VITALS — BP 162/74 | HR 69 | Ht 63.0 in | Wt 145.7 lb

## 2017-06-08 DIAGNOSIS — R351 Nocturia: Secondary | ICD-10-CM | POA: Diagnosis not present

## 2017-06-08 DIAGNOSIS — N3946 Mixed incontinence: Secondary | ICD-10-CM

## 2017-06-08 NOTE — Progress Notes (Signed)
PTNS  Session # Maint 2  Health & Social Factors: No Change Caffeine: 1 Alcohol: 0-1 Daytime voids #per day: 6 Night-time voids #per night: 2 Urgency: None Incontinence Episodes #per day: Changes pad several times a day Ankle used: Left Treatment Setting: 16 Feeling/ Response: Both  Preformed By: Zara Council PA-C  Assistant: Lyndee Hensen CMA  Follow Up: One Month

## 2017-06-24 DIAGNOSIS — E039 Hypothyroidism, unspecified: Secondary | ICD-10-CM | POA: Diagnosis not present

## 2017-06-24 DIAGNOSIS — E782 Mixed hyperlipidemia: Secondary | ICD-10-CM | POA: Diagnosis not present

## 2017-06-24 DIAGNOSIS — I10 Essential (primary) hypertension: Secondary | ICD-10-CM | POA: Diagnosis not present

## 2017-06-24 DIAGNOSIS — I6523 Occlusion and stenosis of bilateral carotid arteries: Secondary | ICD-10-CM | POA: Diagnosis not present

## 2017-06-24 DIAGNOSIS — Z79899 Other long term (current) drug therapy: Secondary | ICD-10-CM | POA: Diagnosis not present

## 2017-06-24 DIAGNOSIS — Z23 Encounter for immunization: Secondary | ICD-10-CM | POA: Diagnosis not present

## 2017-06-24 DIAGNOSIS — E119 Type 2 diabetes mellitus without complications: Secondary | ICD-10-CM | POA: Diagnosis not present

## 2017-07-05 ENCOUNTER — Ambulatory Visit
Admission: RE | Admit: 2017-07-05 | Discharge: 2017-07-05 | Disposition: A | Discharge status: Home / Self Care | Payer: PPO | Source: Ambulatory Visit | Attending: Internal Medicine | Admitting: Internal Medicine

## 2017-07-05 DIAGNOSIS — N6312 Unspecified lump in the right breast, upper inner quadrant: Secondary | ICD-10-CM | POA: Insufficient documentation

## 2017-07-05 DIAGNOSIS — N6310 Unspecified lump in the right breast, unspecified quadrant: Secondary | ICD-10-CM | POA: Diagnosis present

## 2017-07-05 DIAGNOSIS — R922 Inconclusive mammogram: Secondary | ICD-10-CM | POA: Diagnosis not present

## 2017-07-05 DIAGNOSIS — N6311 Unspecified lump in the right breast, upper outer quadrant: Secondary | ICD-10-CM | POA: Diagnosis not present

## 2017-07-05 DIAGNOSIS — N63 Unspecified lump in unspecified breast: Secondary | ICD-10-CM

## 2017-07-05 NOTE — Progress Notes (Signed)
07/06/2017 9:35 AM   Erica Lane 1927/09/11 [id number]  Referring provider: Idelle Crouch, MD [place] [facility] [place], [place] [address]  Chief Complaint  Patient presents with  . PTNS    HPI: 83 yo WF with a history of hematuria, nocturia and mixed incontinence who presents today for PTNS treatment. This will be a maintenance PTNS.    Background history Patient completed hematuria workup on 11/17/2016.  CTU and cystoscopy did not identify any worrisome findings, but she did have a small right lower pole stone.   She is having nocturia and incontinence.  She states the urine just leaks out.  She has failed Vesicare, Toviaz and Myrbetriq.  She also felt that the tampon was too irritating to wear.  She is now wearing three panty liners daily and a POISE pad when she goes on outings. She has not had dysuria, gross hematuria or suprapubic pain.  She is not longer wearing pull ups at night.    She also had a trial of Toviaz and Myrbetriq, both medication she found it ineffective.  Contraindications present for PTNS      Pacemaker - NO      Implantable defibrillator - NO      History of abnormal bleeding - NO      History of neuropathies or nerve damage - NO   Patient goals:     - patient now wants to be completely dry  Baseline symptoms are urgency, she states that she is wearing a pad most of the time, frequency x 4-7, not restricting fluids to avoid visits to the restroom, is engaging in toilet mapping, incontinence x 4-7  and nocturia x 3.    She is using the vaginal estrogen cream 3 days weekly.  She does not find benefit, so she will discontinue the medication at this time.  It is also somewhat cost prohibitive.    Today, she is complaining of nocturia and incontinence..  Over the last week, she is experiencing 6-7 daytime voids (stable), 2 nighttime voids (stable), mild urgency (stable) and 2-3 incontinence episodes daily (stable).  She is  not experiencing dysuria, gross hematuria suprapubic pain.  She is not experiencing fever chills nausea or vomiting.   She has not found the Myrbetriq particularly helpful, but she would like to give it more time.    PMH: Past Medical History:  Diagnosis Date  . Cancer ([place])    skin  . Heart murmur   . Heartburn   . HLD (hyperlipidemia)   . HTN (hypertension)   . Hyperthyroidism     Surgical History: Past Surgical History:  Procedure Laterality Date  . broken foot    . EXCISIONAL HEMORRHOIDECTOMY    . GALLBLADDER SURGERY    . MEDIAL PARTIAL KNEE REPLACEMENT    . TOTAL HIP ARTHROPLASTY Right    from crushed hip  . VAGINAL HYSTERECTOMY  1961    Home Medications:  Allergies as of 07/06/2017   No Known Allergies     Medication List        Accurate as of 07/06/17  9:35 AM. Always use your most recent med list.          aspirin EC 81 MG tablet Take by mouth.   carvedilol 6.25 MG tablet Commonly known as:  COREG Take by mouth.   cloNIDine 0.1 MG tablet Commonly known as:  CATAPRES 1 tablet each AM, 2 tablets each afternoon and 1 tablet at bedtime  conjugated estrogens vaginal cream Commonly known as:  PREMARIN Place 0.5 Applicatorfuls vaginally 2 (two) times a week.   DIGESTIVE ENZYMES PO Take by mouth.   LACTOBACILLUS BIFIDUS PO Take by mouth.   levothyroxine 100 MCG tablet Commonly known as:  SYNTHROID, LEVOTHROID TAKE 1 TABLET (100 MCG TOTAL) BY MOUTH ONCE DAILY. TAKE ON AN EMPTY STOMACH WITH A GLASS OF WATER AT LEAST 30-60 MINUTES BEFORE BREAKFAST.   lovastatin 20 MG tablet Commonly known as:  MEVACOR Take 20 mg at bedtime by mouth.   mirabegron ER 25 MG Tb24 tablet Commonly known as:  MYRBETRIQ Take 1 tablet (25 mg total) by mouth daily.   pantoprazole 40 MG tablet Commonly known as:  PROTONIX Take 40 mg by mouth daily.   PROBIOTIC PO Take by mouth.   PROBIOTIC-10 Caps Take by mouth.   ranitidine 300 MG tablet Commonly known as:   ZANTAC TAKE ONE TABLET BY MOUTH EVERY NIGHT   SM CALCIUM 500/VITAMIN D3 500-400 MG-UNIT tablet Generic drug:  calcium-vitamin D Take by mouth.   sucralfate 1 g tablet Commonly known as:  CARAFATE Take by mouth 2 (two) times daily.   telmisartan 80 MG tablet Commonly known as:  MICARDIS Take by mouth.   Vitamin D (Ergocalciferol) 50000 units Caps capsule Commonly known as:  DRISDOL TAKE 1 CAPSULE BY MOUTH ONCE A WEEK       Allergies: No Known Allergies  Family History: Family History  Problem Relation Age of Onset  . Cancer Sister 77       stomach  . Prostate cancer Brother   . Prostate cancer Brother   . Kidney cancer Neg Hx   . Bladder Cancer Neg Hx   . Breast cancer Neg Hx     Social History:  reports that  has never smoked. she has never used smokeless tobacco. She reports that she drinks alcohol. She reports that she does not use drugs.  ROS: UROLOGY Frequent Urination?: No Hard to postpone urination?: No Burning/pain with urination?: No Get up at night to urinate?: Yes Leakage of urine?: No Urine stream starts and stops?: No Trouble starting stream?: No Do you have to strain to urinate?: No Blood in urine?: No Urinary tract infection?: No Sexually transmitted disease?: No Injury to kidneys or bladder?: No Painful intercourse?: No Weak stream?: No Currently pregnant?: No Vaginal bleeding?: No  Gastrointestinal Nausea?: No Vomiting?: No Indigestion/heartburn?: No Diarrhea?: No Constipation?: No  Constitutional Fever: No Night sweats?: No Weight loss?: No Fatigue?: Yes  Skin Skin rash/lesions?: No Itching?: No  Eyes Blurred vision?: No Double vision?: No  Ears/Nose/Throat Sore throat?: No Sinus problems?: No  Hematologic/Lymphatic Swollen glands?: No Easy bruising?: No  Cardiovascular Leg swelling?: No Chest pain?: No  Respiratory Cough?: No Shortness of breath?: No  Endocrine Excessive thirst?:  No  Musculoskeletal Back pain?: No Joint pain?: No  Neurological Headaches?: No Dizziness?: No  Psychologic Depression?: No Anxiety?: No  Physical Exam: BP 136/72   Pulse (!) 59   Ht 5\' 3"  (1.6 m)   Wt 150 lb 9.6 oz (68.3 kg)   BMI 26.68 kg/m   Constitutional: Well nourished. Alert and oriented, No acute distress. HEENT: [place] AT, moist mucus membranes. Trachea midline, no masses. Cardiovascular: No clubbing, cyanosis, or edema. Respiratory: Normal respiratory effort, no increased work of breathing. Skin: No rashes, bruises or suspicious lesions. Lymph: No cervical or inguinal adenopathy. Neurologic: Grossly intact, no focal deficits, moving all 4 extremities. Psychiatric: Normal mood and affect.  Laboratory Data:  PTNS treatment: The needle electrode was inserted into the lower, inner aspect of the patient's right  leg. The surface electrode was placed on the inside arch of the foot on the treatment leg. The lead set was connected to the stimulator and the needle electrode clip was connected to the needle electrode. The stimulator that produces an adjustable electrical pulse that travels to the sacral nerve plexus via the tibial nerve was increased to 1 until the patient received a sensory response and toe flex.     Assessment & Plan:     1. Mixed incontinence  - Continue maintenance PTNS  - Continue Myrbetriq 25 mg daily   Treatment Plan:    The needle electrode was removed without difficulty to the patient.  Patient tolerated the procedure for 30 minutes.  She will return next month for maintenance PTNS treatment  2. Microscopic hematuria  - RTC in one year for UA (11/2017)  - report any gross hematuria  3. Right renal stone  - KUB in one year (11/2017)  - Advised to contact our office or seek treatment in the ED if becomes febrile or pain/ vomiting are difficult control in order to arrange for emergent/urgent intervention   4. Nocturia  - no longer wearing  pads at night  - undergoing PTNS   Return in about 1 month (around 08/05/2017) for Maintenance PTNS.   These notes generated with voice recognition software. I apologize for typographical errors.  Zara Council, [facility] [address] [place], [place] [address] [phone]

## 2017-07-06 ENCOUNTER — Encounter: Payer: Self-pay | Admitting: Urology

## 2017-07-06 ENCOUNTER — Ambulatory Visit: Payer: PPO | Admitting: Urology

## 2017-07-06 VITALS — BP 136/72 | HR 59 | Ht 63.0 in | Wt 150.6 lb

## 2017-07-06 DIAGNOSIS — R351 Nocturia: Secondary | ICD-10-CM

## 2017-07-06 DIAGNOSIS — N3946 Mixed incontinence: Secondary | ICD-10-CM | POA: Diagnosis not present

## 2017-07-06 NOTE — Progress Notes (Signed)
PTNS  Session # Maintenance #3  Health & Social Factors: No Change Caffeine: 0.5 Cups Alcohol: 0-1 Daytime voids #per day: 6-7 Night-time voids #per night: 2 Urgency: Mild Incontinence Episodes #per day: 2-3 Ankle used: Right Treatment Setting: 1 Feeling/ Response: Sensory and Toe Flex  Preformed By: Zara Council, PA  Assistant: Reece Packer, RN

## 2017-07-13 DIAGNOSIS — I10 Essential (primary) hypertension: Secondary | ICD-10-CM | POA: Diagnosis not present

## 2017-07-13 DIAGNOSIS — I6523 Occlusion and stenosis of bilateral carotid arteries: Secondary | ICD-10-CM | POA: Diagnosis not present

## 2017-07-13 DIAGNOSIS — E782 Mixed hyperlipidemia: Secondary | ICD-10-CM | POA: Diagnosis not present

## 2017-07-13 DIAGNOSIS — E119 Type 2 diabetes mellitus without complications: Secondary | ICD-10-CM | POA: Diagnosis not present

## 2017-07-13 DIAGNOSIS — K210 Gastro-esophageal reflux disease with esophagitis: Secondary | ICD-10-CM | POA: Diagnosis not present

## 2017-07-13 DIAGNOSIS — I119 Hypertensive heart disease without heart failure: Secondary | ICD-10-CM | POA: Diagnosis not present

## 2017-07-29 NOTE — Progress Notes (Signed)
08/02/2017 9:08 AM   [name] Sep 12, 1927 [id number]  Referring provider: Idelle Crouch, MD [facility] [place], [place] [address]  Chief Complaint  Patient presents with  . PTNS    HPI: 83 yo WF with a history of hematuria, nocturia and mixed incontinence who presents today for PTNS treatment. This will be a maintenance PTNS.    Background history Patient completed hematuria workup on 11/17/2016.  CTU and cystoscopy did not identify any worrisome findings, but she did have a small right lower pole stone.   She is having nocturia and incontinence.  She states the urine just leaks out.  She has failed Vesicare, Toviaz and Myrbetriq.  She also felt that the tampon was too irritating to wear.  She is now wearing three panty liners daily and a POISE pad when she goes on outings. She has not had dysuria, gross hematuria or suprapubic pain.  She is not longer wearing pull ups at night.    She also had a trial of Toviaz and Myrbetriq, both medication she found it ineffective.  Contraindications present for PTNS      Pacemaker - NO      Implantable defibrillator - NO      History of abnormal bleeding - NO      History of neuropathies or nerve damage - NO   Patient goals:     - patient now wants to be completely dry  Baseline symptoms are urgency, she states that she is wearing a pad most of the time, frequency x 4-7, not restricting fluids to avoid visits to the restroom, is engaging in toilet mapping, incontinence x 4-7  and nocturia x 3.    She is using the vaginal estrogen cream 3 days weekly.  She does not find benefit, so she will discontinue the medication at this time.  It is also somewhat cost prohibitive.    Today, she is complaining of nocturia and incontinence..  Over the last week, she is experiencing 5 daytime voids (improved), 2 nighttime voids (stable), mild urgency (stable) and 2-3 incontinence episodes daily (stable).  She is  not experiencing dysuria, gross hematuria suprapubic pain.  She is not experiencing fever chills nausea or vomiting.   She has not found the Myrbetriq particularly helpful, but she would like to give it more time.    PMH: Past Medical History:  Diagnosis Date  . Cancer ([place])    skin  . Heart murmur   . Heartburn   . HLD (hyperlipidemia)   . HTN (hypertension)   . Hyperthyroidism     Surgical History: Past Surgical History:  Procedure Laterality Date  . broken foot    . EXCISIONAL HEMORRHOIDECTOMY    . GALLBLADDER SURGERY    . MEDIAL PARTIAL KNEE REPLACEMENT    . TOTAL HIP ARTHROPLASTY Right    from crushed hip  . VAGINAL HYSTERECTOMY  1961    Home Medications:  Allergies as of 08/02/2017   No Known Allergies     Medication List        Accurate as of 08/02/17  9:08 AM. Always use your most recent med list.          aspirin EC 81 MG tablet Take by mouth.   carvedilol 6.25 MG tablet Commonly known as:  COREG Take by mouth.   cloNIDine 0.1 MG tablet Commonly known as:  CATAPRES 1 tablet each AM, 2 tablets each afternoon and 1 tablet at bedtime  conjugated estrogens vaginal cream Commonly known as:  PREMARIN Place 0.5 Applicatorfuls vaginally 2 (two) times a week.   DIGESTIVE ENZYMES PO Take by mouth.   LACTOBACILLUS BIFIDUS PO Take by mouth.   levothyroxine 100 MCG tablet Commonly known as:  SYNTHROID, LEVOTHROID TAKE 1 TABLET (100 MCG TOTAL) BY MOUTH ONCE DAILY. TAKE ON AN EMPTY STOMACH WITH A GLASS OF WATER AT LEAST 30-60 MINUTES BEFORE BREAKFAST.   lovastatin 20 MG tablet Commonly known as:  MEVACOR Take 20 mg at bedtime by mouth.   mirabegron ER 25 MG Tb24 tablet Commonly known as:  MYRBETRIQ Take 1 tablet (25 mg total) by mouth daily.   pantoprazole 40 MG tablet Commonly known as:  PROTONIX Take 40 mg by mouth daily.   PROBIOTIC PO Take by mouth.   PROBIOTIC-10 Caps Take by mouth.   ranitidine 300 MG tablet Commonly known as:   ZANTAC TAKE ONE TABLET BY MOUTH EVERY NIGHT   SM CALCIUM 500/VITAMIN D3 500-400 MG-UNIT tablet Generic drug:  calcium-vitamin D Take by mouth.   sucralfate 1 g tablet Commonly known as:  CARAFATE Take by mouth 2 (two) times daily.   telmisartan 80 MG tablet Commonly known as:  MICARDIS Take by mouth.   Vitamin D (Ergocalciferol) 50000 units Caps capsule Commonly known as:  DRISDOL TAKE 1 CAPSULE BY MOUTH ONCE A WEEK       Allergies: No Known Allergies  Family History: Family History  Problem Relation Age of Onset  . Cancer Sister 5       stomach  . Prostate cancer Brother   . Prostate cancer Brother   . Kidney cancer Neg Hx   . Bladder Cancer Neg Hx   . Breast cancer Neg Hx     Social History:  reports that  has never smoked. she has never used smokeless tobacco. She reports that she drinks alcohol. She reports that she does not use drugs.  ROS: UROLOGY Frequent Urination?: No Hard to postpone urination?: No Burning/pain with urination?: No Get up at night to urinate?: Yes Leakage of urine?: Yes Urine stream starts and stops?: No Trouble starting stream?: No Do you have to strain to urinate?: No Blood in urine?: No Urinary tract infection?: No Sexually transmitted disease?: No Injury to kidneys or bladder?: No Painful intercourse?: No Weak stream?: No Currently pregnant?: No Vaginal bleeding?: No Last menstrual period?: n  Gastrointestinal Nausea?: No Vomiting?: No Indigestion/heartburn?: Yes Diarrhea?: No Constipation?: No  Constitutional Fever: No Night sweats?: No Weight loss?: No Fatigue?: Yes  Skin Skin rash/lesions?: No Itching?: No  Eyes Blurred vision?: No Double vision?: No  Ears/Nose/Throat Sore throat?: No Sinus problems?: No  Hematologic/Lymphatic Swollen glands?: No Easy bruising?: No  Cardiovascular Leg swelling?: No Chest pain?: No  Respiratory Cough?: No Shortness of breath?: No  Endocrine Excessive  thirst?: No  Musculoskeletal Back pain?: No Joint pain?: No  Neurological Headaches?: No Dizziness?: No  Psychologic Depression?: No Anxiety?: No  Physical Exam: BP (!) 184/90   Pulse 69   Ht 5\' 3"  (1.6 m)   Wt 151 lb 1.6 oz (68.5 kg)   BMI 26.77 kg/m   Constitutional: Well nourished. Alert and oriented, No acute distress. HEENT: [place] AT, moist mucus membranes. Trachea midline, no masses. Cardiovascular: No clubbing, cyanosis, or edema. Respiratory: Normal respiratory effort, no increased work of breathing. Skin: No rashes, bruises or suspicious lesions. Lymph: No cervical or inguinal adenopathy. Neurologic: Grossly intact, no focal deficits, moving all 4 extremities. Psychiatric: Normal mood and  affect.  Laboratory Data:  PTNS treatment: The needle electrode was inserted into the lower, inner aspect of the patient's left leg. The surface electrode was placed on the inside arch of the foot on the treatment leg. The lead set was connected to the stimulator and the needle electrode clip was connected to the needle electrode. The stimulator that produces an adjustable electrical pulse that travels to the sacral nerve plexus via the tibial nerve was increased to 6 until the patient received a sensory response.     Assessment & Plan:     1. Mixed incontinence  - Continue maintenance PTNS  - discontinue Myrbetriq 25 mg daily as it is not effective and her BP is elevated   Treatment Plan:    The needle electrode was removed without difficulty to the patient.  Patient tolerated the procedure for 30 minutes.  She will return next month for maintenance PTNS treatment  2. Microscopic hematuria  - RTC in one year for UA (11/2017)  - report any gross hematuria  3. Right renal stone  - KUB in one year (11/2017)  - Advised to contact our office or seek treatment in the ED if becomes febrile or pain/ vomiting are difficult control in order to arrange for emergent/urgent  intervention   4. Nocturia  - no longer wearing pads at night  - undergoing PTNS   Return in about 1 month (around 09/02/2017) for Maintenance PTNS.   These notes generated with voice recognition software. I apologize for typographical errors.  Zara Council, [facility] [address], [place], [place] [address] [phone]

## 2017-08-02 ENCOUNTER — Ambulatory Visit: Payer: PPO | Admitting: Urology

## 2017-08-02 ENCOUNTER — Encounter: Payer: Self-pay | Admitting: Urology

## 2017-08-02 VITALS — BP 184/90 | HR 69 | Ht 63.0 in | Wt 151.1 lb

## 2017-08-02 DIAGNOSIS — N3946 Mixed incontinence: Secondary | ICD-10-CM | POA: Diagnosis not present

## 2017-08-02 DIAGNOSIS — R351 Nocturia: Secondary | ICD-10-CM

## 2017-08-02 NOTE — Progress Notes (Signed)
PTNS  Session # Maint #4  Health & Social Factors: Change Caffeine: 1 Alcohol: 0 Daytime voids #per day: 5 Night-time voids #per night: 2 Urgency: mild Incontinence Episodes #per day: 3 Ankle used: left Treatment Setting: 6 Feeling/ Response: sensory Comments: none  Preformed By: Zara Council, PA-C  Assistant: C. Corinna Capra, CMA

## 2017-08-02 NOTE — Addendum Note (Signed)
Addended by: Leia Alf on: 08/02/2017 12:04 PM   Modules accepted: Orders

## 2017-08-11 ENCOUNTER — Ambulatory Visit
Admission: RE | Admit: 2017-08-11 | Discharge: 2017-08-11 | Disposition: A | Discharge status: Home / Self Care | Payer: PPO | Source: Ambulatory Visit | Attending: Family Medicine | Admitting: Family Medicine

## 2017-08-11 ENCOUNTER — Other Ambulatory Visit: Payer: Self-pay | Admitting: Family Medicine

## 2017-08-11 DIAGNOSIS — R42 Dizziness and giddiness: Secondary | ICD-10-CM

## 2017-08-11 DIAGNOSIS — S0990XA Unspecified injury of head, initial encounter: Secondary | ICD-10-CM | POA: Diagnosis not present

## 2017-08-11 DIAGNOSIS — S62633A Displaced fracture of distal phalanx of left middle finger, initial encounter for closed fracture: Secondary | ICD-10-CM | POA: Diagnosis not present

## 2017-08-11 DIAGNOSIS — R531 Weakness: Secondary | ICD-10-CM | POA: Diagnosis not present

## 2017-08-11 DIAGNOSIS — W06XXXA Fall from bed, initial encounter: Secondary | ICD-10-CM | POA: Diagnosis not present

## 2017-08-11 DIAGNOSIS — W19XXXA Unspecified fall, initial encounter: Secondary | ICD-10-CM

## 2017-08-11 DIAGNOSIS — R29898 Other symptoms and signs involving the musculoskeletal system: Secondary | ICD-10-CM | POA: Diagnosis not present

## 2017-08-11 DIAGNOSIS — M79645 Pain in left finger(s): Secondary | ICD-10-CM | POA: Diagnosis not present

## 2017-08-11 DIAGNOSIS — S62502A Fracture of unspecified phalanx of left thumb, initial encounter for closed fracture: Secondary | ICD-10-CM | POA: Diagnosis not present

## 2017-08-11 DIAGNOSIS — J323 Chronic sphenoidal sinusitis: Secondary | ICD-10-CM | POA: Diagnosis not present

## 2017-08-11 DIAGNOSIS — S0993XA Unspecified injury of face, initial encounter: Secondary | ICD-10-CM | POA: Diagnosis not present

## 2017-08-12 DIAGNOSIS — I119 Hypertensive heart disease without heart failure: Secondary | ICD-10-CM | POA: Diagnosis not present

## 2017-08-12 DIAGNOSIS — E119 Type 2 diabetes mellitus without complications: Secondary | ICD-10-CM | POA: Diagnosis not present

## 2017-08-12 DIAGNOSIS — I6523 Occlusion and stenosis of bilateral carotid arteries: Secondary | ICD-10-CM | POA: Diagnosis not present

## 2017-08-12 DIAGNOSIS — R001 Bradycardia, unspecified: Secondary | ICD-10-CM | POA: Diagnosis not present

## 2017-08-12 DIAGNOSIS — R079 Chest pain, unspecified: Secondary | ICD-10-CM | POA: Diagnosis not present

## 2017-08-12 DIAGNOSIS — E782 Mixed hyperlipidemia: Secondary | ICD-10-CM | POA: Diagnosis not present

## 2017-08-12 DIAGNOSIS — K210 Gastro-esophageal reflux disease with esophagitis: Secondary | ICD-10-CM | POA: Diagnosis not present

## 2017-08-12 DIAGNOSIS — I10 Essential (primary) hypertension: Secondary | ICD-10-CM | POA: Diagnosis not present

## 2017-08-16 DIAGNOSIS — S62515A Nondisplaced fracture of proximal phalanx of left thumb, initial encounter for closed fracture: Secondary | ICD-10-CM | POA: Diagnosis not present

## 2017-08-16 DIAGNOSIS — S62525A Nondisplaced fracture of distal phalanx of left thumb, initial encounter for closed fracture: Secondary | ICD-10-CM | POA: Diagnosis not present

## 2017-08-18 DIAGNOSIS — R001 Bradycardia, unspecified: Secondary | ICD-10-CM | POA: Diagnosis not present

## 2017-08-24 DIAGNOSIS — R079 Chest pain, unspecified: Secondary | ICD-10-CM | POA: Diagnosis not present

## 2017-08-27 DIAGNOSIS — S62525D Nondisplaced fracture of distal phalanx of left thumb, subsequent encounter for fracture with routine healing: Secondary | ICD-10-CM | POA: Diagnosis not present

## 2017-08-30 DIAGNOSIS — I10 Essential (primary) hypertension: Secondary | ICD-10-CM | POA: Diagnosis not present

## 2017-08-30 DIAGNOSIS — I6523 Occlusion and stenosis of bilateral carotid arteries: Secondary | ICD-10-CM | POA: Diagnosis not present

## 2017-08-30 DIAGNOSIS — E782 Mixed hyperlipidemia: Secondary | ICD-10-CM | POA: Diagnosis not present

## 2017-09-01 DIAGNOSIS — E119 Type 2 diabetes mellitus without complications: Secondary | ICD-10-CM | POA: Diagnosis not present

## 2017-09-01 DIAGNOSIS — E039 Hypothyroidism, unspecified: Secondary | ICD-10-CM | POA: Diagnosis not present

## 2017-09-01 DIAGNOSIS — I10 Essential (primary) hypertension: Secondary | ICD-10-CM | POA: Diagnosis not present

## 2017-09-01 NOTE — Progress Notes (Signed)
09/02/2017 10:00 AM   Erica Lane May 09, 1928 [id number]  Referring provider: Idelle Crouch, MD [facility] [place], [place] [address]  Chief Complaint  Patient presents with  . PTNS    HPI: 84 yo WF with a history of hematuria, nocturia and mixed incontinence who presents today for PTNS treatment. This will be a maintenance PTNS.    Background history Patient completed hematuria workup on 11/17/2016.  CTU and cystoscopy did not identify any worrisome findings, but she did have a small right lower pole stone.   She is having nocturia and incontinence.  She states the urine just leaks out.  She has failed Vesicare, Toviaz and Myrbetriq.  She also felt that the tampon was too irritating to wear.  She is now wearing three panty liners daily and a POISE pad when she goes on outings. She has not had dysuria, gross hematuria or suprapubic pain.  She is not longer wearing pull ups at night.    She also had a trial of Toviaz and Myrbetriq, both medication she found it ineffective.  Contraindications present for PTNS      Pacemaker - NO      Implantable defibrillator - NO      History of abnormal bleeding - NO      History of neuropathies or nerve damage - NO   Patient goals:     - patient now wants to be completely dry  Baseline symptoms are urgency, she states that she is wearing a pad most of the time, frequency x 4-7, not restricting fluids to avoid visits to the restroom, is engaging in toilet mapping, incontinence x 4-7  and nocturia x 3.    She is using the vaginal estrogen cream 3 days weekly.  She does not find benefit, so she will discontinue the medication at this time.  It is also somewhat cost prohibitive.    Over the last month, she is experiencing 5 daytime voids (stable), 1 nighttime voids (improved), no urgency (improved) and 0 incontinence episodes daily (improved).  She is not experiencing dysuria, gross hematuria suprapubic pain.   She is not experiencing fever chills nausea or vomiting.     Laboratory Data:  PTNS treatment: The needle electrode was inserted into the lower, inner aspect of the patient's right leg. The surface electrode was placed on the inside arch of the foot on the treatment leg. The lead set was connected to the stimulator and the needle electrode clip was connected to the needle electrode. The stimulator that produces an adjustable electrical pulse that travels to the sacral nerve plexus via the tibial nerve was increased to 5 until the patient received a sensory response.     Assessment & Plan:     1. Mixed incontinence  - Continue maintenance PTNS  - discontinue Myrbetriq 25 mg daily as it is not effective and her BP is elevated   Treatment Plan:    The needle electrode was removed without difficulty to the patient.  Patient tolerated the procedure for 30 minutes.  She will return next month for maintenance PTNS treatment  2. Microscopic hematuria  - RTC in one year for UA (11/2017)  - report any gross hematuria  3. Right renal stone  - KUB in one year (11/2017)  - Advised to contact our office or seek treatment in the ED if becomes febrile or pain/ vomiting are difficult control in order to arrange for emergent/urgent intervention   4.  Nocturia  - no longer wearing pads at night  - undergoing PTNS   Return in about 1 month (around 10/03/2017) for Maintenance PTNS.   These notes generated with voice recognition software. I apologize for typographical errors.  Erica Lane, [facility] [address] [place], [place] [address] [phone]

## 2017-09-02 ENCOUNTER — Ambulatory Visit: Payer: PPO | Admitting: Urology

## 2017-09-02 DIAGNOSIS — R351 Nocturia: Secondary | ICD-10-CM

## 2017-09-02 DIAGNOSIS — N3946 Mixed incontinence: Secondary | ICD-10-CM

## 2017-09-02 NOTE — Progress Notes (Signed)
PTNS  Session # Maint. #5  Health & Social Factors: No change Caffeine: 1/2 cup Alcohol: 0 Daytime voids #per day: 5 Night-time voids #per night: 1 Urgency: none Incontinence Episodes #per day: 0 Ankle used: right Treatment Setting: 5 Feeling/ Response: sensory Comments:   Preformed By: Zara Council, PA  Assistant: Toniann Fail, LPN

## 2017-09-06 ENCOUNTER — Encounter: Payer: Self-pay | Admitting: Urology

## 2017-09-08 ENCOUNTER — Other Ambulatory Visit: Payer: Self-pay | Admitting: Internal Medicine

## 2017-09-08 DIAGNOSIS — R55 Syncope and collapse: Secondary | ICD-10-CM | POA: Diagnosis not present

## 2017-09-08 DIAGNOSIS — H539 Unspecified visual disturbance: Secondary | ICD-10-CM | POA: Diagnosis not present

## 2017-09-08 DIAGNOSIS — I959 Hypotension, unspecified: Secondary | ICD-10-CM | POA: Diagnosis not present

## 2017-09-10 DIAGNOSIS — S62525D Nondisplaced fracture of distal phalanx of left thumb, subsequent encounter for fracture with routine healing: Secondary | ICD-10-CM | POA: Diagnosis not present

## 2017-09-16 DIAGNOSIS — Z79899 Other long term (current) drug therapy: Secondary | ICD-10-CM | POA: Diagnosis not present

## 2017-09-16 DIAGNOSIS — E039 Hypothyroidism, unspecified: Secondary | ICD-10-CM | POA: Diagnosis not present

## 2017-09-16 DIAGNOSIS — E782 Mixed hyperlipidemia: Secondary | ICD-10-CM | POA: Diagnosis not present

## 2017-09-16 DIAGNOSIS — E119 Type 2 diabetes mellitus without complications: Secondary | ICD-10-CM | POA: Diagnosis not present

## 2017-09-16 DIAGNOSIS — I10 Essential (primary) hypertension: Secondary | ICD-10-CM | POA: Diagnosis not present

## 2017-09-17 ENCOUNTER — Ambulatory Visit
Admission: RE | Admit: 2017-09-17 | Discharge: 2017-09-17 | Disposition: A | Discharge status: Home / Self Care | Payer: PPO | Source: Ambulatory Visit | Attending: Internal Medicine | Admitting: Internal Medicine

## 2017-09-17 DIAGNOSIS — R55 Syncope and collapse: Secondary | ICD-10-CM | POA: Diagnosis not present

## 2017-09-17 DIAGNOSIS — R42 Dizziness and giddiness: Secondary | ICD-10-CM | POA: Diagnosis not present

## 2017-09-17 DIAGNOSIS — I6381 Other cerebral infarction due to occlusion or stenosis of small artery: Secondary | ICD-10-CM | POA: Insufficient documentation

## 2017-09-20 DIAGNOSIS — Z1283 Encounter for screening for malignant neoplasm of skin: Secondary | ICD-10-CM | POA: Diagnosis not present

## 2017-09-20 DIAGNOSIS — Z85828 Personal history of other malignant neoplasm of skin: Secondary | ICD-10-CM | POA: Diagnosis not present

## 2017-09-20 DIAGNOSIS — L821 Other seborrheic keratosis: Secondary | ICD-10-CM | POA: Diagnosis not present

## 2017-09-20 DIAGNOSIS — D1800 Hemangioma unspecified site: Secondary | ICD-10-CM | POA: Diagnosis not present

## 2017-09-20 DIAGNOSIS — L578 Other skin changes due to chronic exposure to nonionizing radiation: Secondary | ICD-10-CM | POA: Diagnosis not present

## 2017-09-20 DIAGNOSIS — L812 Freckles: Secondary | ICD-10-CM | POA: Diagnosis not present

## 2017-09-20 DIAGNOSIS — L719 Rosacea, unspecified: Secondary | ICD-10-CM | POA: Diagnosis not present

## 2017-09-20 DIAGNOSIS — D229 Melanocytic nevi, unspecified: Secondary | ICD-10-CM | POA: Diagnosis not present

## 2017-09-20 DIAGNOSIS — L820 Inflamed seborrheic keratosis: Secondary | ICD-10-CM | POA: Diagnosis not present

## 2017-09-20 DIAGNOSIS — L570 Actinic keratosis: Secondary | ICD-10-CM | POA: Diagnosis not present

## 2017-09-23 DIAGNOSIS — E039 Hypothyroidism, unspecified: Secondary | ICD-10-CM | POA: Diagnosis not present

## 2017-09-23 DIAGNOSIS — I10 Essential (primary) hypertension: Secondary | ICD-10-CM | POA: Diagnosis not present

## 2017-09-23 DIAGNOSIS — Z0000 Encounter for general adult medical examination without abnormal findings: Secondary | ICD-10-CM | POA: Diagnosis not present

## 2017-09-23 DIAGNOSIS — E782 Mixed hyperlipidemia: Secondary | ICD-10-CM | POA: Diagnosis not present

## 2017-09-23 DIAGNOSIS — E119 Type 2 diabetes mellitus without complications: Secondary | ICD-10-CM | POA: Diagnosis not present

## 2017-09-29 DIAGNOSIS — I10 Essential (primary) hypertension: Secondary | ICD-10-CM | POA: Diagnosis not present

## 2017-09-29 DIAGNOSIS — E782 Mixed hyperlipidemia: Secondary | ICD-10-CM | POA: Diagnosis not present

## 2017-09-29 DIAGNOSIS — I119 Hypertensive heart disease without heart failure: Secondary | ICD-10-CM | POA: Diagnosis not present

## 2017-09-29 DIAGNOSIS — R42 Dizziness and giddiness: Secondary | ICD-10-CM | POA: Insufficient documentation

## 2017-10-07 ENCOUNTER — Ambulatory Visit (INDEPENDENT_AMBULATORY_CARE_PROVIDER_SITE_OTHER): Payer: PPO

## 2017-10-07 DIAGNOSIS — N3941 Urge incontinence: Secondary | ICD-10-CM | POA: Diagnosis not present

## 2017-10-07 NOTE — Progress Notes (Signed)
PTNS  Session # Maint. 6  Health & Social Factors: No change Caffeine: 1 Alcohol: 0 Daytime voids #per day: 5 Night-time voids #per night: 1-2 Urgency: None Incontinence Episodes #per day: None Ankle used: left Treatment Setting: 2 Feeling/ Response: both Comments:   Preformed By: Toniann Fail, LPN   Follow Up: 1 month

## 2017-10-08 DIAGNOSIS — S62525D Nondisplaced fracture of distal phalanx of left thumb, subsequent encounter for fracture with routine healing: Secondary | ICD-10-CM | POA: Diagnosis not present

## 2017-10-14 ENCOUNTER — Encounter: Payer: PPO | Admitting: Obstetrics and Gynecology

## 2017-11-03 ENCOUNTER — Ambulatory Visit (INDEPENDENT_AMBULATORY_CARE_PROVIDER_SITE_OTHER): Payer: PPO

## 2017-11-03 DIAGNOSIS — N3941 Urge incontinence: Secondary | ICD-10-CM

## 2017-11-03 NOTE — Progress Notes (Signed)
PTNS  Session # Monthly Maintenance    Health & Social Factors: No change Caffeine: 1 Alcohol: 0 Daytime voids #per day: 5 Night-time voids #per night: 1 Urgency: none Incontinence Episodes #per day: none Ankle used: right Treatment Setting: 5 Feeling/ Response: both Comments:   Preformed By: Toniann Fail, LPN   Follow Up: 1 month

## 2017-11-05 DIAGNOSIS — E782 Mixed hyperlipidemia: Secondary | ICD-10-CM | POA: Diagnosis not present

## 2017-11-05 DIAGNOSIS — E119 Type 2 diabetes mellitus without complications: Secondary | ICD-10-CM | POA: Diagnosis not present

## 2017-11-05 DIAGNOSIS — I10 Essential (primary) hypertension: Secondary | ICD-10-CM | POA: Diagnosis not present

## 2017-11-05 DIAGNOSIS — E039 Hypothyroidism, unspecified: Secondary | ICD-10-CM | POA: Diagnosis not present

## 2017-11-12 DIAGNOSIS — M47816 Spondylosis without myelopathy or radiculopathy, lumbar region: Secondary | ICD-10-CM | POA: Diagnosis not present

## 2017-11-12 DIAGNOSIS — M25551 Pain in right hip: Secondary | ICD-10-CM | POA: Diagnosis not present

## 2017-11-12 DIAGNOSIS — M1611 Unilateral primary osteoarthritis, right hip: Secondary | ICD-10-CM | POA: Diagnosis not present

## 2017-11-12 DIAGNOSIS — M5431 Sciatica, right side: Secondary | ICD-10-CM | POA: Diagnosis not present

## 2017-11-15 DIAGNOSIS — M5431 Sciatica, right side: Secondary | ICD-10-CM | POA: Diagnosis not present

## 2017-11-17 DIAGNOSIS — M5136 Other intervertebral disc degeneration, lumbar region: Secondary | ICD-10-CM | POA: Diagnosis not present

## 2017-11-17 DIAGNOSIS — M5416 Radiculopathy, lumbar region: Secondary | ICD-10-CM | POA: Diagnosis not present

## 2017-11-20 DIAGNOSIS — I10 Essential (primary) hypertension: Secondary | ICD-10-CM | POA: Diagnosis not present

## 2017-11-20 DIAGNOSIS — Z7982 Long term (current) use of aspirin: Secondary | ICD-10-CM | POA: Diagnosis not present

## 2017-11-20 DIAGNOSIS — Z90710 Acquired absence of both cervix and uterus: Secondary | ICD-10-CM | POA: Diagnosis not present

## 2017-11-20 DIAGNOSIS — Z79891 Long term (current) use of opiate analgesic: Secondary | ICD-10-CM | POA: Diagnosis not present

## 2017-11-20 DIAGNOSIS — E785 Hyperlipidemia, unspecified: Secondary | ICD-10-CM | POA: Diagnosis not present

## 2017-11-20 DIAGNOSIS — I6523 Occlusion and stenosis of bilateral carotid arteries: Secondary | ICD-10-CM | POA: Diagnosis not present

## 2017-11-20 DIAGNOSIS — E119 Type 2 diabetes mellitus without complications: Secondary | ICD-10-CM | POA: Diagnosis not present

## 2017-11-20 DIAGNOSIS — E039 Hypothyroidism, unspecified: Secondary | ICD-10-CM | POA: Diagnosis not present

## 2017-11-20 DIAGNOSIS — M810 Age-related osteoporosis without current pathological fracture: Secondary | ICD-10-CM | POA: Diagnosis not present

## 2017-11-20 DIAGNOSIS — Z9181 History of falling: Secondary | ICD-10-CM | POA: Diagnosis not present

## 2017-11-20 DIAGNOSIS — M25551 Pain in right hip: Secondary | ICD-10-CM | POA: Diagnosis not present

## 2017-11-20 DIAGNOSIS — M5117 Intervertebral disc disorders with radiculopathy, lumbosacral region: Secondary | ICD-10-CM | POA: Diagnosis not present

## 2017-11-20 DIAGNOSIS — K219 Gastro-esophageal reflux disease without esophagitis: Secondary | ICD-10-CM | POA: Diagnosis not present

## 2017-12-08 ENCOUNTER — Ambulatory Visit (INDEPENDENT_AMBULATORY_CARE_PROVIDER_SITE_OTHER): Payer: PPO

## 2017-12-08 DIAGNOSIS — N3941 Urge incontinence: Secondary | ICD-10-CM

## 2017-12-08 NOTE — Progress Notes (Signed)
PTNS  Session # monthly  Health & Social Factors: no change Caffeine: 1 Alcohol: 0 Daytime voids #per day: 6 Night-time voids #per night: 2 Urgency: none Incontinence Episodes #per day: 0 Ankle used: left Treatment Setting: 7 Feeling/ Response: toe flex Comments:   Preformed By: Toniann Fail, LPN   Follow Up: 1 month

## 2017-12-21 DIAGNOSIS — Z79899 Other long term (current) drug therapy: Secondary | ICD-10-CM | POA: Diagnosis not present

## 2017-12-21 DIAGNOSIS — E039 Hypothyroidism, unspecified: Secondary | ICD-10-CM | POA: Diagnosis not present

## 2017-12-21 DIAGNOSIS — E119 Type 2 diabetes mellitus without complications: Secondary | ICD-10-CM | POA: Diagnosis not present

## 2017-12-21 DIAGNOSIS — I10 Essential (primary) hypertension: Secondary | ICD-10-CM | POA: Diagnosis not present

## 2017-12-21 DIAGNOSIS — E782 Mixed hyperlipidemia: Secondary | ICD-10-CM | POA: Diagnosis not present

## 2018-01-11 ENCOUNTER — Ambulatory Visit (INDEPENDENT_AMBULATORY_CARE_PROVIDER_SITE_OTHER): Payer: PPO

## 2018-01-11 DIAGNOSIS — N3941 Urge incontinence: Secondary | ICD-10-CM

## 2018-01-11 NOTE — Progress Notes (Signed)
PTNS  Session # Monthly  Health & Social Factors: No Change Caffeine: 1 Alcohol: 0 Daytime voids #per day: 2-3 Night-time voids #per night: 2-3 Urgency: mild Incontinence Episodes #per day: 0 Ankle used: Right Treatment Setting: 2 Feeling/ Response: Toe Flex/Sensory Comments:  Preformed By: Cristie Hem, CMA

## 2018-01-20 DIAGNOSIS — I6523 Occlusion and stenosis of bilateral carotid arteries: Secondary | ICD-10-CM | POA: Diagnosis not present

## 2018-01-20 DIAGNOSIS — E119 Type 2 diabetes mellitus without complications: Secondary | ICD-10-CM | POA: Diagnosis not present

## 2018-01-20 DIAGNOSIS — I119 Hypertensive heart disease without heart failure: Secondary | ICD-10-CM | POA: Diagnosis not present

## 2018-01-20 DIAGNOSIS — K210 Gastro-esophageal reflux disease with esophagitis: Secondary | ICD-10-CM | POA: Diagnosis not present

## 2018-01-20 DIAGNOSIS — E782 Mixed hyperlipidemia: Secondary | ICD-10-CM | POA: Diagnosis not present

## 2018-01-20 DIAGNOSIS — I10 Essential (primary) hypertension: Secondary | ICD-10-CM | POA: Diagnosis not present

## 2018-02-09 ENCOUNTER — Ambulatory Visit (INDEPENDENT_AMBULATORY_CARE_PROVIDER_SITE_OTHER): Payer: PPO | Admitting: Family Medicine

## 2018-02-09 DIAGNOSIS — N3941 Urge incontinence: Secondary | ICD-10-CM

## 2018-02-09 NOTE — Progress Notes (Signed)
PTNS  Session # Monthly  Health & Social Factors: no change Caffeine: 1 Alcohol: 0 Daytime voids #per day: 6 Night-time voids #per night: 2 Urgency: mild Incontinence Episodes #per day: 0 Ankle used: left Treatment Setting: 13 Feeling/ Response: both Comments: Patient tolerated well  Preformed By: Elberta Leatherwood, CMA  Follow Up: 1 month

## 2018-03-14 NOTE — Progress Notes (Signed)
03/16/2018 10:10 AM   Erica Lane 04-10-1928 [id number]  Referring provider: Idelle Crouch, MD [facility] [place], [place] [address]  Chief Complaint  Patient presents with  . Urinary Incontinence    HPI: Patient is a 84 year old Caucasian female with mixed incontinence, vaginal atrophy and history of hematuria who presents today for follow up.    Mixed incontinence She has been tried on anticholinergics, Myrbetriq, could not be fitted for a pessary and PTNS.    She has met goal with PTNS.    The patient has been experiencing urgency x 0-3, frequency x 4-7, not restricting fluids to avoid visits to the restroom, not engaging in toilet mapping, incontinence x 0-3 and nocturia x 0-3.   PVR is 0 mL.   Her BP is 142/81.    Risk factors for incontinence: age, vaginal atrophy, cystocele, pelvic floor laxity  Vaginal atrophy  She is not using the vaginal estrogen cream.    History of hematuria Hematuria work up with CTU and cystoscopy was completed in 11/2016 - findings positive for adrenal glands are normal. Nonobstructing calculus measuring 6 mm in lower pole of the RIGHT kidney. No ureterolithiasis or obstructive uropathy.  No clear enhancing renal cortical lesion. There is a bilateral small cortical renal lesions measuring 8 mm on the RIGHT (image 32, series 5) and 5 mm on LEFT which are too small to characterize. These lesions are increased in size from 2009.  No filling defects the collecting systems or ureters. 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  No bladder calculi, enhancing bladder lesions, or filling defect within the bladder. - no malignancies found  She has not had any gross hematuria over the last year.  UA today is negative for hematuria.     PMH: Past Medical History:  Diagnosis Date  . Cancer ([place])    skin  . Heart murmur   . Heartburn   . HLD (hyperlipidemia)   . HTN (hypertension)   . Hyperthyroidism     Surgical  History: Past Surgical History:  Procedure Laterality Date  . broken foot    . EXCISIONAL HEMORRHOIDECTOMY    . GALLBLADDER SURGERY    . MEDIAL PARTIAL KNEE REPLACEMENT    . TOTAL HIP ARTHROPLASTY Right    from crushed hip  . VAGINAL HYSTERECTOMY  1961    Home Medications:  Allergies as of 03/16/2018   No Known Allergies     Medication List        Accurate as of 03/16/18 10:10 AM. Always use your most recent med list.          amLODipine 5 MG tablet Commonly known as:  NORVASC   aspirin EC 81 MG tablet Take by mouth.   carvedilol 6.25 MG tablet Commonly known as:  COREG Take by mouth.   cloNIDine 0.1 MG tablet Commonly known as:  CATAPRES 1 tablet each AM, 2 tablets each afternoon and 1 tablet at bedtime   DIGESTIVE ENZYMES PO Take by mouth.   levothyroxine 100 MCG tablet Commonly known as:  SYNTHROID, LEVOTHROID TAKE 1 TABLET (100 MCG TOTAL) BY MOUTH ONCE DAILY. TAKE ON AN EMPTY STOMACH WITH A GLASS OF WATER AT LEAST 30-60 MINUTES BEFORE BREAKFAST.   lovastatin 20 MG tablet Commonly known as:  MEVACOR Take 20 mg at bedtime by mouth.   pantoprazole 40 MG tablet Commonly known as:  PROTONIX Take 40 mg by mouth daily.   ranitidine 300 MG tablet Commonly known  as:  ZANTAC TAKE ONE TABLET BY MOUTH EVERY NIGHT   SM CALCIUM 500/VITAMIN D3 500-400 MG-UNIT tablet Generic drug:  calcium-vitamin D Take by mouth.   sucralfate 1 g tablet Commonly known as:  CARAFATE Take by mouth 2 (two) times daily.   telmisartan 80 MG tablet Commonly known as:  MICARDIS Take by mouth.   Vitamin D (Ergocalciferol) 50000 units Caps capsule Commonly known as:  DRISDOL TAKE 1 CAPSULE BY MOUTH ONCE A WEEK       Allergies: No Known Allergies  Family History: Family History  Problem Relation Age of Onset  . Cancer Sister 47       stomach  . Prostate cancer Brother   . Prostate cancer Brother   . Kidney cancer Neg Hx   . Bladder Cancer Neg Hx   . Breast cancer Neg  Hx     Social History:  reports that she has never smoked. She has never used smokeless tobacco. She reports that she drinks alcohol. She reports that she does not use drugs.  ROS: UROLOGY Frequent Urination?: No Hard to postpone urination?: No Burning/pain with urination?: Yes Get up at night to urinate?: No Leakage of urine?: Yes Urine stream starts and stops?: No Trouble starting stream?: No Do you have to strain to urinate?: No Blood in urine?: No Urinary tract infection?: No Sexually transmitted disease?: No Injury to kidneys or bladder?: No Painful intercourse?: No Weak stream?: No Currently pregnant?: No Vaginal bleeding?: No Last menstrual period?: n  Gastrointestinal Nausea?: No Vomiting?: No Indigestion/heartburn?: No Diarrhea?: No Constipation?: No  Constitutional Fever: No Night sweats?: No Weight loss?: No Fatigue?: Yes  Skin Skin rash/lesions?: No Itching?: No  Eyes Blurred vision?: No Double vision?: No  Ears/Nose/Throat Sore throat?: No Sinus problems?: No  Hematologic/Lymphatic Swollen glands?: No Easy bruising?: No  Cardiovascular Leg swelling?: No Chest pain?: No  Respiratory Cough?: No Shortness of breath?: No  Endocrine Excessive thirst?: No  Musculoskeletal Back pain?: No Joint pain?: No  Neurological Headaches?: No Dizziness?: No  Psychologic Depression?: No Anxiety?: No  Physical Exam: BP (!) 142/81 (BP Location: Left Arm, Patient Position: Sitting, Cuff Size: Normal)   Pulse 67   Ht _0  (1.6 m)   Wt 149 lb 4.8 oz (67.7 kg)   BMI 26.45 kg/m   Constitutional: Well nourished. Alert and oriented, No acute distress. HEENT: [place] AT, moist mucus membranes. Trachea midline, no masses. Cardiovascular: No clubbing, cyanosis, or edema. Respiratory: Normal respiratory effort, no increased work of breathing. Skin: No rashes, bruises or suspicious lesions. Lymph: No cervical or inguinal adenopathy. Neurologic:  Grossly intact, no focal deficits, moving all 4 extremities. Psychiatric: Normal mood and affect.  Laboratory Data: Lab Results  Component Value Date   WBC 8.6 07/09/2013   HGB 13.5 07/09/2013   HCT 38.8 07/09/2013   MCV 91 07/09/2013   PLT 273 07/09/2013    Lab Results  Component Value Date   CREATININE 0.99 09/23/2016    No results found for: PSA  No results found for: TESTOSTERONE  Lab Results  Component Value Date   HGBA1C 5.9 06/09/2013    Lab Results  Component Value Date   TSH 0.527 06/09/2013    No results found for: CHOL, HDL, CHOLHDL, VLDL, LDLCALC  Lab Results  Component Value Date   AST 18 07/09/2013   Lab Results  Component Value Date   ALT 17 07/09/2013   No components found for: ALKALINEPHOPHATASE No components found for: BILIRUBINTOTAL  No results  found for: ESTRADIOL  Urinalysis Negative for hematuria.  See Epic and HPI.    I have reviewed the labs.   Pertinent Imaging: Results for YASMENE, SALOMONE (MRN [id number]) as of 03/16/2018 09:59  Ref. Range 03/16/2018 09:36  Scan Result Unknown 0     Assessment & Plan:    1. Mixed incontinence Doing well with PTNS  2. Vaginal atrophy Not using vaginal cream   3. Cystocele Not bothersome  4. History of hematuria Hematuria work up in 11/2016 was negative for malignancies No report of gross hematuria UA is negative.   RTC in one year for UA - patient to report any gross hematuria in the interim  Return for continue PTNS .  These notes generated with voice recognition software. I apologize for typographical errors.  Zara Council, PA-C  [facility] [address] [place], [place] [address] [phone]  Did not charge patient as she had an extended wait.

## 2018-03-16 ENCOUNTER — Encounter: Payer: Self-pay | Admitting: Urology

## 2018-03-16 ENCOUNTER — Ambulatory Visit (INDEPENDENT_AMBULATORY_CARE_PROVIDER_SITE_OTHER): Payer: PPO | Admitting: Urology

## 2018-03-16 VITALS — BP 142/81 | HR 67 | Ht 63.0 in | Wt 149.3 lb

## 2018-03-16 DIAGNOSIS — Z87448 Personal history of other diseases of urinary system: Secondary | ICD-10-CM

## 2018-03-16 DIAGNOSIS — N952 Postmenopausal atrophic vaginitis: Secondary | ICD-10-CM

## 2018-03-16 DIAGNOSIS — M5416 Radiculopathy, lumbar region: Secondary | ICD-10-CM | POA: Diagnosis not present

## 2018-03-16 DIAGNOSIS — N3946 Mixed incontinence: Secondary | ICD-10-CM | POA: Diagnosis not present

## 2018-03-16 DIAGNOSIS — M9905 Segmental and somatic dysfunction of pelvic region: Secondary | ICD-10-CM | POA: Diagnosis not present

## 2018-03-16 DIAGNOSIS — M5136 Other intervertebral disc degeneration, lumbar region: Secondary | ICD-10-CM | POA: Diagnosis not present

## 2018-03-16 DIAGNOSIS — M9903 Segmental and somatic dysfunction of lumbar region: Secondary | ICD-10-CM | POA: Diagnosis not present

## 2018-03-16 LAB — URINALYSIS, COMPLETE
BILIRUBIN UA: NEGATIVE
Glucose, UA: NEGATIVE
Ketones, UA: NEGATIVE
LEUKOCYTES UA: NEGATIVE
Nitrite, UA: NEGATIVE
PH UA: 6.5 (ref 5.0–7.5)
Specific Gravity, UA: 1.015 (ref 1.005–1.030)
Urobilinogen, Ur: 0.2 mg/dL (ref 0.2–1.0)

## 2018-03-16 LAB — MICROSCOPIC EXAMINATION

## 2018-03-16 LAB — BLADDER SCAN AMB NON-IMAGING: SCAN RESULT: 0

## 2018-03-17 DIAGNOSIS — M9905 Segmental and somatic dysfunction of pelvic region: Secondary | ICD-10-CM | POA: Diagnosis not present

## 2018-03-17 DIAGNOSIS — M5136 Other intervertebral disc degeneration, lumbar region: Secondary | ICD-10-CM | POA: Diagnosis not present

## 2018-03-17 DIAGNOSIS — M5416 Radiculopathy, lumbar region: Secondary | ICD-10-CM | POA: Diagnosis not present

## 2018-03-17 DIAGNOSIS — M9903 Segmental and somatic dysfunction of lumbar region: Secondary | ICD-10-CM | POA: Diagnosis not present

## 2018-03-21 DIAGNOSIS — M5416 Radiculopathy, lumbar region: Secondary | ICD-10-CM | POA: Diagnosis not present

## 2018-03-21 DIAGNOSIS — M5136 Other intervertebral disc degeneration, lumbar region: Secondary | ICD-10-CM | POA: Diagnosis not present

## 2018-03-21 DIAGNOSIS — M9903 Segmental and somatic dysfunction of lumbar region: Secondary | ICD-10-CM | POA: Diagnosis not present

## 2018-03-21 DIAGNOSIS — R8290 Unspecified abnormal findings in urine: Secondary | ICD-10-CM | POA: Diagnosis not present

## 2018-03-21 DIAGNOSIS — E782 Mixed hyperlipidemia: Secondary | ICD-10-CM | POA: Diagnosis not present

## 2018-03-21 DIAGNOSIS — E039 Hypothyroidism, unspecified: Secondary | ICD-10-CM | POA: Diagnosis not present

## 2018-03-21 DIAGNOSIS — Z79899 Other long term (current) drug therapy: Secondary | ICD-10-CM | POA: Diagnosis not present

## 2018-03-21 DIAGNOSIS — M9905 Segmental and somatic dysfunction of pelvic region: Secondary | ICD-10-CM | POA: Diagnosis not present

## 2018-03-21 DIAGNOSIS — I10 Essential (primary) hypertension: Secondary | ICD-10-CM | POA: Diagnosis not present

## 2018-03-21 DIAGNOSIS — E119 Type 2 diabetes mellitus without complications: Secondary | ICD-10-CM | POA: Diagnosis not present

## 2018-03-23 DIAGNOSIS — L812 Freckles: Secondary | ICD-10-CM | POA: Diagnosis not present

## 2018-03-23 DIAGNOSIS — L570 Actinic keratosis: Secondary | ICD-10-CM | POA: Diagnosis not present

## 2018-03-23 DIAGNOSIS — Z85828 Personal history of other malignant neoplasm of skin: Secondary | ICD-10-CM | POA: Diagnosis not present

## 2018-03-23 DIAGNOSIS — D1800 Hemangioma unspecified site: Secondary | ICD-10-CM | POA: Diagnosis not present

## 2018-03-23 DIAGNOSIS — M5136 Other intervertebral disc degeneration, lumbar region: Secondary | ICD-10-CM | POA: Diagnosis not present

## 2018-03-23 DIAGNOSIS — D2230 Melanocytic nevi of unspecified part of face: Secondary | ICD-10-CM | POA: Diagnosis not present

## 2018-03-23 DIAGNOSIS — D2260 Melanocytic nevi of unspecified upper limb, including shoulder: Secondary | ICD-10-CM | POA: Diagnosis not present

## 2018-03-23 DIAGNOSIS — M5416 Radiculopathy, lumbar region: Secondary | ICD-10-CM | POA: Diagnosis not present

## 2018-03-23 DIAGNOSIS — L578 Other skin changes due to chronic exposure to nonionizing radiation: Secondary | ICD-10-CM | POA: Diagnosis not present

## 2018-03-23 DIAGNOSIS — I781 Nevus, non-neoplastic: Secondary | ICD-10-CM | POA: Diagnosis not present

## 2018-03-23 DIAGNOSIS — M9903 Segmental and somatic dysfunction of lumbar region: Secondary | ICD-10-CM | POA: Diagnosis not present

## 2018-03-23 DIAGNOSIS — Z1283 Encounter for screening for malignant neoplasm of skin: Secondary | ICD-10-CM | POA: Diagnosis not present

## 2018-03-23 DIAGNOSIS — M9905 Segmental and somatic dysfunction of pelvic region: Secondary | ICD-10-CM | POA: Diagnosis not present

## 2018-03-23 DIAGNOSIS — I8393 Asymptomatic varicose veins of bilateral lower extremities: Secondary | ICD-10-CM | POA: Diagnosis not present

## 2018-03-23 DIAGNOSIS — T07XXXA Unspecified multiple injuries, initial encounter: Secondary | ICD-10-CM | POA: Diagnosis not present

## 2018-03-23 DIAGNOSIS — L821 Other seborrheic keratosis: Secondary | ICD-10-CM | POA: Diagnosis not present

## 2018-03-24 DIAGNOSIS — M5136 Other intervertebral disc degeneration, lumbar region: Secondary | ICD-10-CM | POA: Diagnosis not present

## 2018-03-24 DIAGNOSIS — M9903 Segmental and somatic dysfunction of lumbar region: Secondary | ICD-10-CM | POA: Diagnosis not present

## 2018-03-24 DIAGNOSIS — M5416 Radiculopathy, lumbar region: Secondary | ICD-10-CM | POA: Diagnosis not present

## 2018-03-24 DIAGNOSIS — M9905 Segmental and somatic dysfunction of pelvic region: Secondary | ICD-10-CM | POA: Diagnosis not present

## 2018-03-28 ENCOUNTER — Other Ambulatory Visit: Payer: Self-pay | Admitting: Internal Medicine

## 2018-03-28 DIAGNOSIS — M9905 Segmental and somatic dysfunction of pelvic region: Secondary | ICD-10-CM | POA: Diagnosis not present

## 2018-03-28 DIAGNOSIS — Z1231 Encounter for screening mammogram for malignant neoplasm of breast: Secondary | ICD-10-CM

## 2018-03-28 DIAGNOSIS — E039 Hypothyroidism, unspecified: Secondary | ICD-10-CM | POA: Diagnosis not present

## 2018-03-28 DIAGNOSIS — E119 Type 2 diabetes mellitus without complications: Secondary | ICD-10-CM | POA: Diagnosis not present

## 2018-03-28 DIAGNOSIS — I10 Essential (primary) hypertension: Secondary | ICD-10-CM | POA: Diagnosis not present

## 2018-03-28 DIAGNOSIS — M5136 Other intervertebral disc degeneration, lumbar region: Secondary | ICD-10-CM | POA: Diagnosis not present

## 2018-03-28 DIAGNOSIS — Z1239 Encounter for other screening for malignant neoplasm of breast: Secondary | ICD-10-CM | POA: Diagnosis not present

## 2018-03-28 DIAGNOSIS — M5416 Radiculopathy, lumbar region: Secondary | ICD-10-CM | POA: Diagnosis not present

## 2018-03-28 DIAGNOSIS — M9903 Segmental and somatic dysfunction of lumbar region: Secondary | ICD-10-CM | POA: Diagnosis not present

## 2018-03-28 DIAGNOSIS — E782 Mixed hyperlipidemia: Secondary | ICD-10-CM | POA: Diagnosis not present

## 2018-03-30 DIAGNOSIS — M9905 Segmental and somatic dysfunction of pelvic region: Secondary | ICD-10-CM | POA: Diagnosis not present

## 2018-03-30 DIAGNOSIS — M9903 Segmental and somatic dysfunction of lumbar region: Secondary | ICD-10-CM | POA: Diagnosis not present

## 2018-03-30 DIAGNOSIS — M5136 Other intervertebral disc degeneration, lumbar region: Secondary | ICD-10-CM | POA: Diagnosis not present

## 2018-03-30 DIAGNOSIS — M5416 Radiculopathy, lumbar region: Secondary | ICD-10-CM | POA: Diagnosis not present

## 2018-03-31 DIAGNOSIS — M9905 Segmental and somatic dysfunction of pelvic region: Secondary | ICD-10-CM | POA: Diagnosis not present

## 2018-03-31 DIAGNOSIS — M5136 Other intervertebral disc degeneration, lumbar region: Secondary | ICD-10-CM | POA: Diagnosis not present

## 2018-03-31 DIAGNOSIS — M9903 Segmental and somatic dysfunction of lumbar region: Secondary | ICD-10-CM | POA: Diagnosis not present

## 2018-03-31 DIAGNOSIS — M5416 Radiculopathy, lumbar region: Secondary | ICD-10-CM | POA: Diagnosis not present

## 2018-04-04 DIAGNOSIS — M5416 Radiculopathy, lumbar region: Secondary | ICD-10-CM | POA: Diagnosis not present

## 2018-04-04 DIAGNOSIS — M9903 Segmental and somatic dysfunction of lumbar region: Secondary | ICD-10-CM | POA: Diagnosis not present

## 2018-04-04 DIAGNOSIS — M9905 Segmental and somatic dysfunction of pelvic region: Secondary | ICD-10-CM | POA: Diagnosis not present

## 2018-04-04 DIAGNOSIS — M5136 Other intervertebral disc degeneration, lumbar region: Secondary | ICD-10-CM | POA: Diagnosis not present

## 2018-04-05 DIAGNOSIS — M5416 Radiculopathy, lumbar region: Secondary | ICD-10-CM | POA: Diagnosis not present

## 2018-04-05 DIAGNOSIS — M5136 Other intervertebral disc degeneration, lumbar region: Secondary | ICD-10-CM | POA: Diagnosis not present

## 2018-04-05 DIAGNOSIS — M9903 Segmental and somatic dysfunction of lumbar region: Secondary | ICD-10-CM | POA: Diagnosis not present

## 2018-04-05 DIAGNOSIS — M9905 Segmental and somatic dysfunction of pelvic region: Secondary | ICD-10-CM | POA: Diagnosis not present

## 2018-04-06 ENCOUNTER — Ambulatory Visit
Admission: RE | Admit: 2018-04-06 | Discharge: 2018-04-06 | Disposition: A | Discharge status: Home / Self Care | Payer: PPO | Source: Ambulatory Visit | Attending: Internal Medicine | Admitting: Internal Medicine

## 2018-04-06 DIAGNOSIS — Z1231 Encounter for screening mammogram for malignant neoplasm of breast: Secondary | ICD-10-CM | POA: Diagnosis not present

## 2018-04-07 DIAGNOSIS — M5416 Radiculopathy, lumbar region: Secondary | ICD-10-CM | POA: Diagnosis not present

## 2018-04-07 DIAGNOSIS — M9903 Segmental and somatic dysfunction of lumbar region: Secondary | ICD-10-CM | POA: Diagnosis not present

## 2018-04-07 DIAGNOSIS — M5136 Other intervertebral disc degeneration, lumbar region: Secondary | ICD-10-CM | POA: Diagnosis not present

## 2018-04-07 DIAGNOSIS — M9905 Segmental and somatic dysfunction of pelvic region: Secondary | ICD-10-CM | POA: Diagnosis not present

## 2018-04-11 DIAGNOSIS — M5416 Radiculopathy, lumbar region: Secondary | ICD-10-CM | POA: Diagnosis not present

## 2018-04-11 DIAGNOSIS — M5136 Other intervertebral disc degeneration, lumbar region: Secondary | ICD-10-CM | POA: Diagnosis not present

## 2018-04-11 DIAGNOSIS — M9903 Segmental and somatic dysfunction of lumbar region: Secondary | ICD-10-CM | POA: Diagnosis not present

## 2018-04-11 DIAGNOSIS — M9905 Segmental and somatic dysfunction of pelvic region: Secondary | ICD-10-CM | POA: Diagnosis not present

## 2018-04-13 DIAGNOSIS — M5416 Radiculopathy, lumbar region: Secondary | ICD-10-CM | POA: Diagnosis not present

## 2018-04-13 DIAGNOSIS — M9903 Segmental and somatic dysfunction of lumbar region: Secondary | ICD-10-CM | POA: Diagnosis not present

## 2018-04-13 DIAGNOSIS — M5136 Other intervertebral disc degeneration, lumbar region: Secondary | ICD-10-CM | POA: Diagnosis not present

## 2018-04-13 DIAGNOSIS — M9905 Segmental and somatic dysfunction of pelvic region: Secondary | ICD-10-CM | POA: Diagnosis not present

## 2018-04-14 DIAGNOSIS — M5136 Other intervertebral disc degeneration, lumbar region: Secondary | ICD-10-CM | POA: Diagnosis not present

## 2018-04-14 DIAGNOSIS — M9903 Segmental and somatic dysfunction of lumbar region: Secondary | ICD-10-CM | POA: Diagnosis not present

## 2018-04-14 DIAGNOSIS — M9905 Segmental and somatic dysfunction of pelvic region: Secondary | ICD-10-CM | POA: Diagnosis not present

## 2018-04-14 DIAGNOSIS — M5416 Radiculopathy, lumbar region: Secondary | ICD-10-CM | POA: Diagnosis not present

## 2018-04-19 ENCOUNTER — Ambulatory Visit (INDEPENDENT_AMBULATORY_CARE_PROVIDER_SITE_OTHER): Payer: PPO

## 2018-04-19 DIAGNOSIS — N3941 Urge incontinence: Secondary | ICD-10-CM

## 2018-04-19 DIAGNOSIS — M9905 Segmental and somatic dysfunction of pelvic region: Secondary | ICD-10-CM | POA: Diagnosis not present

## 2018-04-19 DIAGNOSIS — M9903 Segmental and somatic dysfunction of lumbar region: Secondary | ICD-10-CM | POA: Diagnosis not present

## 2018-04-19 DIAGNOSIS — M5416 Radiculopathy, lumbar region: Secondary | ICD-10-CM | POA: Diagnosis not present

## 2018-04-19 DIAGNOSIS — M5136 Other intervertebral disc degeneration, lumbar region: Secondary | ICD-10-CM | POA: Diagnosis not present

## 2018-04-19 NOTE — Progress Notes (Signed)
  04/19/2018 6:44 AM   Erica Lane 04/10/1928 [id number]  Referring provider: Sparks, Jeffrey D, MD [address] [facility] [place], [place] [address]  No chief complaint on file.   HPI: Patient is a 84-year-old Caucasian female with mixed incontinence, vaginal atrophy and history of hematuria who presents today for follow up.    Mixed incontinence She has been tried on anticholinergics, Myrbetriq, could not be fitted for a pessary and PTNS.    She has met goal with PTNS.    The patient has been experiencing urgency x 0-3, frequency x 4-7, not restricting fluids to avoid visits to the restroom, not engaging in toilet mapping, incontinence x 0-3 and nocturia x 0-3.   PVR is 0 mL.   Her BP is 142/81.    Risk factors for incontinence: age, vaginal atrophy, cystocele, pelvic floor laxity  Vaginal atrophy  She is not using the vaginal estrogen cream.    History of hematuria Hematuria work up with CTU and cystoscopy was completed in 11/2016 - findings positive for adrenal glands are normal. Nonobstructing calculus measuring 6 mm in lower pole of the RIGHT kidney. No ureterolithiasis or obstructive uropathy.  No clear enhancing renal cortical lesion. There is a bilateral small cortical renal lesions measuring 8 mm on the RIGHT (image 32, series 5) and 5 mm on LEFT which are too small to characterize. These lesions are increased in size from 2009.  No filling defects the collecting systems or ureters. 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  No bladder calculi, enhancing bladder lesions, or filling defect within the bladder. - no malignancies found  She has not had any gross hematuria over the last year.  UA today is negative for hematuria.     PMH: Past Medical History:  Diagnosis Date  . Cancer (HCC)    skin  . Heart murmur   . Heartburn   . HLD (hyperlipidemia)   . HTN (hypertension)   . Hyperthyroidism     Surgical History: Past Surgical History:  Procedure  Laterality Date  . broken foot    . EXCISIONAL HEMORRHOIDECTOMY    . GALLBLADDER SURGERY    . MEDIAL PARTIAL KNEE REPLACEMENT    . TOTAL HIP ARTHROPLASTY Right    from crushed hip  . VAGINAL HYSTERECTOMY  1961    Home Medications:  Allergies as of 04/19/2018   No Known Allergies     Medication List        Accurate as of 04/19/18  6:44 AM. Always use your most recent med list.          amLODipine 5 MG tablet Commonly known as:  NORVASC   aspirin EC 81 MG tablet Take by mouth.   carvedilol 6.25 MG tablet Commonly known as:  COREG Take by mouth.   cloNIDine 0.1 MG tablet Commonly known as:  CATAPRES 1 tablet each AM, 2 tablets each afternoon and 1 tablet at bedtime   DIGESTIVE ENZYMES PO Take by mouth.   levothyroxine 100 MCG tablet Commonly known as:  SYNTHROID, LEVOTHROID TAKE 1 TABLET (100 MCG TOTAL) BY MOUTH ONCE DAILY. TAKE ON AN EMPTY STOMACH WITH A GLASS OF WATER AT LEAST 30-60 MINUTES BEFORE BREAKFAST.   lovastatin 20 MG tablet Commonly known as:  MEVACOR Take 20 mg at bedtime by mouth.   pantoprazole 40 MG tablet Commonly known as:  PROTONIX Take 40 mg by mouth daily.   ranitidine 300 MG tablet Commonly known as:  ZANTAC TAKE ONE   TABLET BY MOUTH EVERY NIGHT   SM CALCIUM 500/VITAMIN D3 500-400 MG-UNIT tablet Generic drug:  calcium-vitamin D Take by mouth.   sucralfate 1 g tablet Commonly known as:  CARAFATE Take by mouth 2 (two) times daily.   telmisartan 80 MG tablet Commonly known as:  MICARDIS Take by mouth.   Vitamin D (Ergocalciferol) 50000 units Caps capsule Commonly known as:  DRISDOL TAKE 1 CAPSULE BY MOUTH ONCE A WEEK       Allergies: No Known Allergies  Family History: Family History  Problem Relation Age of Onset  . Cancer Sister 35       stomach  . Prostate cancer Brother   . Prostate cancer Brother   . Kidney cancer Neg Hx   . Bladder Cancer Neg Hx   . Breast cancer Neg Hx     Social History:  reports that she has  never smoked. She has never used smokeless tobacco. She reports that she drinks alcohol. She reports that she does not use drugs.  ROS:                                        Physical Exam: There were no vitals taken for this visit.  Constitutional: Well nourished. Alert and oriented, No acute distress. HEENT: [place] AT, moist mucus membranes. Trachea midline, no masses. Cardiovascular: No clubbing, cyanosis, or edema. Respiratory: Normal respiratory effort, no increased work of breathing. Skin: No rashes, bruises or suspicious lesions. Lymph: No cervical or inguinal adenopathy. Neurologic: Grossly intact, no focal deficits, moving all 4 extremities. Psychiatric: Normal mood and affect.  Laboratory Data: Lab Results  Component Value Date   WBC 8.6 07/09/2013   HGB 13.5 07/09/2013   HCT 38.8 07/09/2013   MCV 91 07/09/2013   PLT 273 07/09/2013    Lab Results  Component Value Date   CREATININE 0.99 09/23/2016    No results found for: PSA  No results found for: TESTOSTERONE  Lab Results  Component Value Date   HGBA1C 5.9 06/09/2013    Lab Results  Component Value Date   TSH 0.527 06/09/2013    No results found for: CHOL, HDL, CHOLHDL, VLDL, LDLCALC  Lab Results  Component Value Date   AST 18 07/09/2013   Lab Results  Component Value Date   ALT 17 07/09/2013   No components found for: ALKALINEPHOPHATASE No components found for: BILIRUBINTOTAL  No results found for: ESTRADIOL  Urinalysis Negative for hematuria.  See Epic and HPI.    I have reviewed the labs.   Pertinent Imaging: Results for Erica Lane, Erica Lane (MRN [id number]) as of 03/16/2018 09:59  Ref. Range 03/16/2018 09:36  Scan Result Unknown 0     Assessment & Plan:    1. Mixed incontinence Doing well with PTNS  2. Vaginal atrophy Not using vaginal cream   3. Cystocele Not bothersome  4. History of hematuria Hematuria work up in 11/2016 was negative for  malignancies No report of gross hematuria UA is negative.   RTC in one year for UA - patient to report any gross hematuria in the interim  No follow-ups on file.  These notes generated with voice recognition software. I apologize for typographical errors.  Zara Council, PA-C  [facility] [address] [place], [place] [address] [phone]  Did not charge patient as she had an extended wait.

## 2018-04-19 NOTE — Progress Notes (Addendum)
PTNS  Session # Maintence  Health & Social Factors: No change  Caffeine:1 Alcohol: 0 Daytime voids #per day: 6 Night-time voids #per night: 2 Urgency: Mild Incontinence Episodes #per day: 0 Ankle used: Left Treatment Setting: 12 Feeling/ Response: Sensory Comments: None  Preformed By: Gordy Clement, CMA   Follow Up: As scheduled

## 2018-04-21 DIAGNOSIS — M5136 Other intervertebral disc degeneration, lumbar region: Secondary | ICD-10-CM | POA: Diagnosis not present

## 2018-04-21 DIAGNOSIS — M9905 Segmental and somatic dysfunction of pelvic region: Secondary | ICD-10-CM | POA: Diagnosis not present

## 2018-04-21 DIAGNOSIS — M5416 Radiculopathy, lumbar region: Secondary | ICD-10-CM | POA: Diagnosis not present

## 2018-04-21 DIAGNOSIS — M9903 Segmental and somatic dysfunction of lumbar region: Secondary | ICD-10-CM | POA: Diagnosis not present

## 2018-04-26 DIAGNOSIS — M5136 Other intervertebral disc degeneration, lumbar region: Secondary | ICD-10-CM | POA: Diagnosis not present

## 2018-04-26 DIAGNOSIS — M9903 Segmental and somatic dysfunction of lumbar region: Secondary | ICD-10-CM | POA: Diagnosis not present

## 2018-04-26 DIAGNOSIS — M9905 Segmental and somatic dysfunction of pelvic region: Secondary | ICD-10-CM | POA: Diagnosis not present

## 2018-04-26 DIAGNOSIS — M5416 Radiculopathy, lumbar region: Secondary | ICD-10-CM | POA: Diagnosis not present

## 2018-04-28 DIAGNOSIS — M9903 Segmental and somatic dysfunction of lumbar region: Secondary | ICD-10-CM | POA: Diagnosis not present

## 2018-04-28 DIAGNOSIS — M5416 Radiculopathy, lumbar region: Secondary | ICD-10-CM | POA: Diagnosis not present

## 2018-04-28 DIAGNOSIS — M5136 Other intervertebral disc degeneration, lumbar region: Secondary | ICD-10-CM | POA: Diagnosis not present

## 2018-04-28 DIAGNOSIS — M9905 Segmental and somatic dysfunction of pelvic region: Secondary | ICD-10-CM | POA: Diagnosis not present

## 2018-05-03 DIAGNOSIS — M5136 Other intervertebral disc degeneration, lumbar region: Secondary | ICD-10-CM | POA: Diagnosis not present

## 2018-05-03 DIAGNOSIS — M9903 Segmental and somatic dysfunction of lumbar region: Secondary | ICD-10-CM | POA: Diagnosis not present

## 2018-05-03 DIAGNOSIS — M9905 Segmental and somatic dysfunction of pelvic region: Secondary | ICD-10-CM | POA: Diagnosis not present

## 2018-05-03 DIAGNOSIS — M5416 Radiculopathy, lumbar region: Secondary | ICD-10-CM | POA: Diagnosis not present

## 2018-05-05 DIAGNOSIS — M9903 Segmental and somatic dysfunction of lumbar region: Secondary | ICD-10-CM | POA: Diagnosis not present

## 2018-05-05 DIAGNOSIS — M9905 Segmental and somatic dysfunction of pelvic region: Secondary | ICD-10-CM | POA: Diagnosis not present

## 2018-05-05 DIAGNOSIS — M5416 Radiculopathy, lumbar region: Secondary | ICD-10-CM | POA: Diagnosis not present

## 2018-05-05 DIAGNOSIS — M5136 Other intervertebral disc degeneration, lumbar region: Secondary | ICD-10-CM | POA: Diagnosis not present

## 2018-05-19 ENCOUNTER — Ambulatory Visit (INDEPENDENT_AMBULATORY_CARE_PROVIDER_SITE_OTHER): Payer: PPO | Admitting: Family Medicine

## 2018-05-19 DIAGNOSIS — N3941 Urge incontinence: Secondary | ICD-10-CM | POA: Diagnosis not present

## 2018-05-19 NOTE — Progress Notes (Signed)
PTNS  Session # maint.  Health & Social Factors: no change Caffeine: 1 Alcohol: 0 Daytime voids #per day: 6 Night-time voids #per night: 2 Urgency: mild Incontinence Episodes #per day: 0 Ankle used: right Treatment Setting: 7 Feeling/ Response: sensory Comments: patient tolerated well  Preformed By: Elberta Leatherwood, CMA  Follow Up: 3 weeks to see Larene Beach

## 2018-06-02 ENCOUNTER — Ambulatory Visit: Payer: PPO | Admitting: Urology

## 2018-06-02 ENCOUNTER — Telehealth: Payer: Self-pay | Admitting: Urology

## 2018-06-02 ENCOUNTER — Encounter: Payer: Self-pay | Admitting: Urology

## 2018-06-02 VITALS — BP 126/77 | HR 66 | Ht 63.0 in | Wt 144.3 lb

## 2018-06-02 DIAGNOSIS — I6523 Occlusion and stenosis of bilateral carotid arteries: Secondary | ICD-10-CM | POA: Diagnosis not present

## 2018-06-02 DIAGNOSIS — E782 Mixed hyperlipidemia: Secondary | ICD-10-CM | POA: Diagnosis not present

## 2018-06-02 DIAGNOSIS — I10 Essential (primary) hypertension: Secondary | ICD-10-CM | POA: Diagnosis not present

## 2018-06-02 DIAGNOSIS — I119 Hypertensive heart disease without heart failure: Secondary | ICD-10-CM | POA: Diagnosis not present

## 2018-06-02 DIAGNOSIS — E119 Type 2 diabetes mellitus without complications: Secondary | ICD-10-CM | POA: Diagnosis not present

## 2018-06-02 DIAGNOSIS — R001 Bradycardia, unspecified: Secondary | ICD-10-CM | POA: Diagnosis not present

## 2018-06-02 DIAGNOSIS — N3941 Urge incontinence: Secondary | ICD-10-CM

## 2018-06-02 LAB — BLADDER SCAN AMB NON-IMAGING: Scan Result: 0

## 2018-06-02 NOTE — Progress Notes (Signed)
06/02/2018 10:02 AM   Erica Lane 1928/07/13 [id number]  Referring provider: Idelle Crouch, MD [facility] [place], [place] [address]  Chief Complaint  Patient presents with  . Follow-up    HPI: Patient is a 84 year old Caucasian female with mixed incontinence, vaginal atrophy and history of hematuria who presents today for follow up.    Mixed incontinence She has been tried on anticholinergics, Myrbetriq, could not be fitted for a pessary and PTNS.  When she presented for her PTNS appointment last month, she felt that it was no longer effective for her and wanted a re trial with Toviaz.  The patient has been experiencing urgency x 0-3 (stable), frequency x 4-7 (stable), not restricting fluids to avoid visits to the restroom, not engaging in toilet mapping, incontinence x 0-3 (stable) and nocturia x 0-3 (stable).   PVR is 0 mL.   Risk factors for incontinence: age, vaginal atrophy, cystocele, pelvic floor laxity  Vaginal atrophy  She is not using the vaginal estrogen cream.    History of hematuria Hematuria work up with CTU and cystoscopy was completed in 11/2016 - findings positive for adrenal glands are normal. Nonobstructing calculus measuring 6 mm in lower pole of the RIGHT kidney. No ureterolithiasis or obstructive uropathy.  No clear enhancing renal cortical lesion. There is a bilateral small cortical renal lesions measuring 8 mm on the RIGHT (image 32, series 5) and 5 mm on LEFT which are too small to characterize. These lesions are increased in size from 2009.  No filling defects the collecting systems or ureters. 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  No bladder calculi, enhancing bladder lesions, or filling defect within the bladder. - no malignancies found.  She does not report any gross hematuria.   PMH: Past Medical History:  Diagnosis Date  . Cancer ([place])    skin  . Heart murmur   . Heartburn   . HLD (hyperlipidemia)   . HTN  (hypertension)   . Hyperthyroidism     Surgical History: Past Surgical History:  Procedure Laterality Date  . broken foot    . EXCISIONAL HEMORRHOIDECTOMY    . GALLBLADDER SURGERY    . MEDIAL PARTIAL KNEE REPLACEMENT    . TOTAL HIP ARTHROPLASTY Right    from crushed hip  . VAGINAL HYSTERECTOMY  1961    Home Medications:  Allergies as of 06/02/2018   No Known Allergies     Medication List        Accurate as of 06/02/18 10:02 AM. Always use your most recent med list.          amLODipine 5 MG tablet Commonly known as:  NORVASC   aspirin EC 81 MG tablet Take by mouth.   carvedilol 6.25 MG tablet Commonly known as:  COREG Take by mouth.   cloNIDine 0.1 MG tablet Commonly known as:  CATAPRES 1 tablet each AM, 2 tablets each afternoon and 1 tablet at bedtime   DIGESTIVE ENZYMES PO Take by mouth.   levothyroxine 100 MCG tablet Commonly known as:  SYNTHROID, LEVOTHROID TAKE 1 TABLET (100 MCG TOTAL) BY MOUTH ONCE DAILY. TAKE ON AN EMPTY STOMACH WITH A GLASS OF WATER AT LEAST 30-60 MINUTES BEFORE BREAKFAST.   lovastatin 20 MG tablet Commonly known as:  MEVACOR Take 20 mg at bedtime by mouth.   pantoprazole 40 MG tablet Commonly known as:  PROTONIX Take 40 mg by mouth daily.   ranitidine 300 MG tablet Commonly known as:  ZANTAC TAKE ONE TABLET BY MOUTH EVERY NIGHT   SM CALCIUM 500/VITAMIN D3 500-400 MG-UNIT tablet Generic drug:  calcium-vitamin D Take by mouth.   sucralfate 1 g tablet Commonly known as:  CARAFATE Take by mouth 2 (two) times daily.   telmisartan 80 MG tablet Commonly known as:  MICARDIS Take by mouth.   Vitamin D (Ergocalciferol) 50000 units Caps capsule Commonly known as:  DRISDOL TAKE 1 CAPSULE BY MOUTH ONCE A WEEK       Allergies: No Known Allergies  Family History: Family History  Problem Relation Age of Onset  . Cancer Sister 25       stomach  . Prostate cancer Brother   . Prostate cancer Brother   . Kidney cancer Neg  Hx   . Bladder Cancer Neg Hx   . Breast cancer Neg Hx     Social History:  reports that she has never smoked. She has never used smokeless tobacco. She reports that she drinks alcohol. She reports that she does not use drugs.  ROS: UROLOGY Frequent Urination?: No Hard to postpone urination?: No Burning/pain with urination?: No Get up at night to urinate?: Yes Leakage of urine?: Yes Urine stream starts and stops?: No Trouble starting stream?: No Do you have to strain to urinate?: No Blood in urine?: No Urinary tract infection?: No Sexually transmitted disease?: No Injury to kidneys or bladder?: No Painful intercourse?: No Weak stream?: No Currently pregnant?: No Vaginal bleeding?: No Last menstrual period?: n  Gastrointestinal Nausea?: No Vomiting?: No Indigestion/heartburn?: No Diarrhea?: No Constipation?: No  Constitutional Fever: No Night sweats?: No Weight loss?: No Fatigue?: No  Skin Skin rash/lesions?: No  Eyes Blurred vision?: No Double vision?: No  Ears/Nose/Throat Sore throat?: No Sinus problems?: No  Hematologic/Lymphatic Swollen glands?: No Easy bruising?: No  Cardiovascular Chest pain?: No  Respiratory Cough?: No Shortness of breath?: No  Endocrine Excessive thirst?: No  Musculoskeletal Back pain?: No Joint pain?: No  Neurological Headaches?: No Dizziness?: No  Psychologic Depression?: No Anxiety?: No  Physical Exam: BP 126/77 (BP Location: Left Arm, Patient Position: Sitting, Cuff Size: Normal)   Pulse 66   Ht 5\' 3"  (1.6 m)   Wt 144 lb 4.8 oz (65.5 kg)   BMI 25.56 kg/m   Constitutional: Well nourished. Alert and oriented, No acute distress. HEENT: [place] AT, moist mucus membranes. Trachea midline, no masses. Cardiovascular: No clubbing, cyanosis, or edema. Respiratory: Normal respiratory effort, no increased work of breathing. Skin: No rashes, bruises or suspicious lesions. Lymph: No cervical or inguinal  adenopathy. Neurologic: Grossly intact, no focal deficits, moving all 4 extremities. Psychiatric: Normal mood and affect.   Laboratory Data: Lab Results  Component Value Date   WBC 8.6 07/09/2013   HGB 13.5 07/09/2013   HCT 38.8 07/09/2013   MCV 91 07/09/2013   PLT 273 07/09/2013    Lab Results  Component Value Date   CREATININE 0.99 09/23/2016    No results found for: PSA  No results found for: TESTOSTERONE  Lab Results  Component Value Date   HGBA1C 5.9 06/09/2013    Lab Results  Component Value Date   TSH 0.527 06/09/2013    No results found for: CHOL, HDL, CHOLHDL, VLDL, LDLCALC  Lab Results  Component Value Date   AST 18 07/09/2013   Lab Results  Component Value Date   ALT 17 07/09/2013   No components found for: ALKALINEPHOPHATASE No components found for: BILIRUBINTOTAL  No results found for: ESTRADIOL  I have reviewed  the labs.   Pertinent Imaging: Results for AMELIYA, NICOTRA (MRN [id number]) as of 06/02/2018 09:50  Ref. Range 06/02/2018 09:29  Scan Result Unknown 0    Assessment & Plan:    1. Mixed incontinence Doing well with PTNS - but is finding it difficult to make it here monthly due to transportation issues She would like to continue the Toviaz 8 mg daily and skip a treatment today to see if her symptoms worsen She will call back in 1 month and let us know if she wants a prescription for the Toviaz 8 mg daily or if she wants to resume the PTNS  2. Vaginal atrophy Not using vaginal cream   3. Cystocele Not bothersome  4. History of hematuria Hematuria work up in 11/2016 was negative for malignancies No report of gross hematuria UA is negative.   RTC in one year for UA - patient to report any gross hematuria in the interim  Return for Patient will call.  These notes generated with voice recognition software. I apologize for typographical errors.  Erica Council, PA-C  [facility] [address] [place], [place] [address] [phone]

## 2018-06-20 MED ORDER — FESOTERODINE FUMARATE ER 8 MG PO TB24: 8.0000 mg | ORAL_TABLET | Freq: Every day | ORAL | 3 refills | Status: DC

## 2018-06-20 NOTE — Telephone Encounter (Signed)
Pt tried samples of Toviaz and would like a RX called in to 3 month supply to [facility] on [address].

## 2018-06-20 NOTE — Telephone Encounter (Signed)
Rx sent to pharmacy   

## 2018-06-22 ENCOUNTER — Telehealth: Payer: Self-pay | Admitting: Urology

## 2018-06-22 NOTE — Telephone Encounter (Signed)
Patient called and said that the Lisbeth Ply was to expensive I advised her to call her pharmacy and ask what was one the formulary that they would cover and not be so much.   Is there anything else she can take?  Sharyn Lull

## 2018-06-22 NOTE — Telephone Encounter (Signed)
Pharmacy told pt they wouldn't pay on Toviaz.  They told her there were alternatives:  Darisenacin (hydrobromide) Oxybutynin Chloride Solifenacin Succinate Tolterodine Tartrate [employer]    Pharmacy said they were sending over a paper for Erica Lane to fill out to determine if insurance would cover or not.

## 2018-06-27 MED ORDER — TROSPIUM CHLORIDE ER 60 MG PO CP24: 60.0000 mg | ORAL_CAPSULE | Freq: Every day | ORAL | 0 refills | Status: DC

## 2018-06-27 NOTE — Telephone Encounter (Signed)
Let's send in tropsium 60 mg XL qd to her pharmacy.  30 day supply with one refill and have her follow up in one month for PVR and symptom recheck.

## 2018-06-27 NOTE — Addendum Note (Signed)
Addended by: Garnette Gunner on: 06/27/2018 03:37 PM   Modules accepted: Orders

## 2018-06-27 NOTE — Telephone Encounter (Signed)
Left detailed message for pt to start Trospium instead of Toviaz. Also informed her she needs to schedule 1 month follow up.

## 2018-06-29 DIAGNOSIS — K210 Gastro-esophageal reflux disease with esophagitis: Secondary | ICD-10-CM | POA: Diagnosis not present

## 2018-06-29 DIAGNOSIS — E039 Hypothyroidism, unspecified: Secondary | ICD-10-CM | POA: Diagnosis not present

## 2018-06-29 DIAGNOSIS — E119 Type 2 diabetes mellitus without complications: Secondary | ICD-10-CM | POA: Diagnosis not present

## 2018-06-29 DIAGNOSIS — I10 Essential (primary) hypertension: Secondary | ICD-10-CM | POA: Diagnosis not present

## 2018-06-29 DIAGNOSIS — E782 Mixed hyperlipidemia: Secondary | ICD-10-CM | POA: Diagnosis not present

## 2018-06-29 DIAGNOSIS — Z23 Encounter for immunization: Secondary | ICD-10-CM | POA: Diagnosis not present

## 2018-06-30 ENCOUNTER — Telehealth: Payer: Self-pay | Admitting: Family Medicine

## 2018-06-30 NOTE — Telephone Encounter (Signed)
Erica Lane has been approved from 06/30/2018-08/17/2019 per health team [employer], patient notified via voicemail to pick up medication at pharmacy.

## 2018-07-11 ENCOUNTER — Telehealth: Payer: Self-pay | Admitting: Urology

## 2018-07-11 NOTE — Telephone Encounter (Signed)
Would you see how the Lisbeth Ply is working for Mrs. Barris?

## 2018-07-11 NOTE — Telephone Encounter (Signed)
Pt called and said she was taking trospium.  She is confused as to why she wasn't taking Toviaz.  She has been taking 1 capsule daily on an empty stomach.  She just started taking this past weekend.  She called to schedule a 1 month appt.  She wants to know why she isn't taking Toviaz.  Please call pt [phone] or cell# [phone]

## 2018-07-12 NOTE — Telephone Encounter (Signed)
Called pt she states that she is quite confused about why she was not given Toviaz at the pharmacy. Lengthy had with pt explaining to her that Erica Lane was sent into her pharamcy on 06/20/2018. Unsure as to why pharmacy told her the RX was no longer there. Pt states that she is going to take Trospium Chloride as she has already paid for it, then patient will access if it is beneficial or not. Patient had called her pharmacy and confirmed that her insurance will cover the [place].

## 2018-07-23 ENCOUNTER — Emergency Department: Payer: PPO

## 2018-07-23 ENCOUNTER — Observation Stay
Admission: EM | Admit: 2018-07-23 | Discharge: 2018-07-24 | Disposition: A | Discharge status: Home / Self Care | Payer: PPO | Attending: Internal Medicine | Admitting: Internal Medicine

## 2018-07-23 ENCOUNTER — Observation Stay: Payer: PPO

## 2018-07-23 ENCOUNTER — Other Ambulatory Visit: Payer: Self-pay

## 2018-07-23 ENCOUNTER — Encounter: Payer: Self-pay | Admitting: Emergency Medicine

## 2018-07-23 DIAGNOSIS — E039 Hypothyroidism, unspecified: Secondary | ICD-10-CM | POA: Diagnosis not present

## 2018-07-23 DIAGNOSIS — Z96641 Presence of right artificial hip joint: Secondary | ICD-10-CM | POA: Insufficient documentation

## 2018-07-23 DIAGNOSIS — K219 Gastro-esophageal reflux disease without esophagitis: Secondary | ICD-10-CM | POA: Diagnosis not present

## 2018-07-23 DIAGNOSIS — R9431 Abnormal electrocardiogram [ECG] [EKG]: Secondary | ICD-10-CM | POA: Diagnosis not present

## 2018-07-23 DIAGNOSIS — I10 Essential (primary) hypertension: Secondary | ICD-10-CM | POA: Insufficient documentation

## 2018-07-23 DIAGNOSIS — E119 Type 2 diabetes mellitus without complications: Secondary | ICD-10-CM | POA: Insufficient documentation

## 2018-07-23 DIAGNOSIS — Z79899 Other long term (current) drug therapy: Secondary | ICD-10-CM | POA: Insufficient documentation

## 2018-07-23 DIAGNOSIS — Z7982 Long term (current) use of aspirin: Secondary | ICD-10-CM | POA: Insufficient documentation

## 2018-07-23 DIAGNOSIS — K122 Cellulitis and abscess of mouth: Secondary | ICD-10-CM | POA: Diagnosis not present

## 2018-07-23 DIAGNOSIS — K210 Gastro-esophageal reflux disease with esophagitis: Secondary | ICD-10-CM | POA: Diagnosis not present

## 2018-07-23 DIAGNOSIS — R079 Chest pain, unspecified: Secondary | ICD-10-CM | POA: Diagnosis not present

## 2018-07-23 DIAGNOSIS — J029 Acute pharyngitis, unspecified: Secondary | ICD-10-CM | POA: Diagnosis not present

## 2018-07-23 DIAGNOSIS — K1379 Other lesions of oral mucosa: Secondary | ICD-10-CM | POA: Diagnosis present

## 2018-07-23 DIAGNOSIS — Z79890 Hormone replacement therapy (postmenopausal): Secondary | ICD-10-CM | POA: Insufficient documentation

## 2018-07-23 DIAGNOSIS — E782 Mixed hyperlipidemia: Secondary | ICD-10-CM | POA: Diagnosis not present

## 2018-07-23 DIAGNOSIS — R220 Localized swelling, mass and lump, head: Secondary | ICD-10-CM | POA: Diagnosis not present

## 2018-07-23 LAB — CBC WITH DIFFERENTIAL/PLATELET
ABS IMMATURE GRANULOCYTES: 0.06 10*3/uL (ref 0.00–0.07)
Basophils Absolute: 0 10*3/uL (ref 0.0–0.1)
Basophils Relative: 1 %
EOS PCT: 2 %
Eosinophils Absolute: 0.2 10*3/uL (ref 0.0–0.5)
HEMATOCRIT: 37.5 % (ref 36.0–46.0)
HEMOGLOBIN: 13.1 g/dL (ref 12.0–15.0)
Immature Granulocytes: 1 %
LYMPHS ABS: 1.3 10*3/uL (ref 0.7–4.0)
LYMPHS PCT: 15 %
MCH: 32 pg (ref 26.0–34.0)
MCHC: 34.9 g/dL (ref 30.0–36.0)
MCV: 91.7 fL (ref 80.0–100.0)
MONO ABS: 0.6 10*3/uL (ref 0.1–1.0)
MONOS PCT: 8 %
Neutro Abs: 6.3 10*3/uL (ref 1.7–7.7)
Neutrophils Relative %: 73 %
Platelets: 268 10*3/uL (ref 150–400)
RBC: 4.09 MIL/uL (ref 3.87–5.11)
RDW: 12.8 % (ref 11.5–15.5)
WBC: 8.6 10*3/uL (ref 4.0–10.5)
nRBC: 0 % (ref 0.0–0.2)

## 2018-07-23 LAB — COMPREHENSIVE METABOLIC PANEL
ALK PHOS: 75 U/L (ref 38–126)
ALT: 18 U/L (ref 0–44)
ANION GAP: 6 (ref 5–15)
AST: 21 U/L (ref 15–41)
Albumin: 3.7 g/dL (ref 3.5–5.0)
BILIRUBIN TOTAL: 0.6 mg/dL (ref 0.3–1.2)
BUN: 18 mg/dL (ref 8–23)
CALCIUM: 9.9 mg/dL (ref 8.9–10.3)
CO2: 25 mmol/L (ref 22–32)
CREATININE: 0.92 mg/dL (ref 0.44–1.00)
Chloride: 105 mmol/L (ref 98–111)
GFR calc Af Amer: >60 mL/min (ref >60)
GFR calc non Af Amer: 56 mL/min — ABNORMAL LOW (ref >60)
Glucose, Bld: 126 mg/dL — ABNORMAL HIGH (ref 70–99)
Potassium: 4.1 mmol/L (ref 3.5–5.1)
Sodium: 136 mmol/L (ref 135–145)
TOTAL PROTEIN: 7 g/dL (ref 6.5–8.1)

## 2018-07-23 LAB — TROPONIN I
Troponin I: <0.03 ng/mL (ref <0.03)
Troponin I: <0.03 ng/mL (ref <0.03)
Troponin I: <0.03 ng/mL (ref <0.03)

## 2018-07-23 LAB — GLUCOSE, CAPILLARY
Glucose-Capillary: 156 mg/dL — ABNORMAL HIGH (ref 70–99)
Glucose-Capillary: 137 mg/dL — ABNORMAL HIGH (ref 70–99)

## 2018-07-23 MED ORDER — ACETAMINOPHEN 325 MG PO TABS: 650.0000 mg | ORAL_TABLET | Freq: Four times a day (QID) | ORAL | Status: DC | PRN

## 2018-07-23 MED ORDER — ONDANSETRON HCL 4 MG/2ML IJ SOLN: 4.0000 mg | Freq: Four times a day (QID) | INTRAMUSCULAR | Status: DC | PRN

## 2018-07-23 MED ORDER — ONDANSETRON HCL 4 MG PO TABS: 4.0000 mg | ORAL_TABLET | Freq: Four times a day (QID) | ORAL | Status: DC | PRN

## 2018-07-23 MED ORDER — IOHEXOL 300 MG/ML  SOLN
75.0000 mL | Freq: Once | INTRAMUSCULAR | Status: AC | PRN
  Administered 2018-07-23: 75 mL via INTRAVENOUS

## 2018-07-23 MED ORDER — ASPIRIN EC 81 MG PO TBEC
81.0000 mg | DELAYED_RELEASE_TABLET | Freq: Every day | ORAL | Status: DC
  Administered 2018-07-23 – 2018-07-24 (×2): 81 mg via ORAL
  Filled 2018-07-23 (×2): qty 1

## 2018-07-23 MED ORDER — INSULIN ASPART 100 UNIT/ML ~~LOC~~ SOLN: 0.0000 [IU] | Freq: Every day | SUBCUTANEOUS | Status: DC

## 2018-07-23 MED ORDER — ACETAMINOPHEN 650 MG RE SUPP: 650.0000 mg | Freq: Four times a day (QID) | RECTAL | Status: DC | PRN

## 2018-07-23 MED ORDER — DEXAMETHASONE 4 MG PO TABS
6.0000 mg | ORAL_TABLET | Freq: Once | ORAL | Status: AC
  Administered 2018-07-23: 6 mg via ORAL
  Filled 2018-07-23: qty 1.5

## 2018-07-23 MED ORDER — INSULIN ASPART 100 UNIT/ML ~~LOC~~ SOLN
0.0000 [IU] | Freq: Three times a day (TID) | SUBCUTANEOUS | Status: DC
  Administered 2018-07-23: 2 [IU] via SUBCUTANEOUS
  Filled 2018-07-23 (×2): qty 1

## 2018-07-23 MED ORDER — AMOXICILLIN-POT CLAVULANATE 875-125 MG PO TABS
1.0000 | ORAL_TABLET | Freq: Once | ORAL | Status: AC
  Administered 2018-07-23: 1 via ORAL
  Filled 2018-07-23: qty 1

## 2018-07-23 MED ORDER — AMLODIPINE BESYLATE 5 MG PO TABS
5.0000 mg | ORAL_TABLET | Freq: Every day | ORAL | Status: DC
  Administered 2018-07-23 – 2018-07-24 (×2): 5 mg via ORAL
  Filled 2018-07-23 (×2): qty 1

## 2018-07-23 MED ORDER — ALUM HYDROXIDE-MAG TRISILICATE 80-20 MG PO CHEW: 2.0000 | CHEWABLE_TABLET | Freq: Four times a day (QID) | ORAL | Status: DC | PRN

## 2018-07-23 MED ORDER — HYDRALAZINE HCL 20 MG/ML IJ SOLN: 5.0000 mg | INTRAMUSCULAR | Status: DC | PRN

## 2018-07-23 MED ORDER — POLYETHYLENE GLYCOL 3350 17 G PO PACK: 17.0000 g | PACK | Freq: Every day | ORAL | Status: DC | PRN

## 2018-07-23 MED ORDER — ALUM & MAG HYDROXIDE-SIMETH 200-200-20 MG/5ML PO SUSP: 30.0000 mL | Freq: Four times a day (QID) | ORAL | Status: DC | PRN

## 2018-07-23 MED ORDER — PANTOPRAZOLE SODIUM 40 MG PO TBEC
40.0000 mg | DELAYED_RELEASE_TABLET | Freq: Two times a day (BID) | ORAL | Status: DC
  Administered 2018-07-23 – 2018-07-24 (×3): 40 mg via ORAL
  Filled 2018-07-23 (×3): qty 1

## 2018-07-23 MED ORDER — PRAVASTATIN SODIUM 20 MG PO TABS
20.0000 mg | ORAL_TABLET | Freq: Every day | ORAL | Status: DC
  Administered 2018-07-23: 20 mg via ORAL
  Filled 2018-07-23: qty 1

## 2018-07-23 MED ORDER — FAMOTIDINE 20 MG PO TABS
40.0000 mg | ORAL_TABLET | Freq: Every day | ORAL | Status: DC
  Administered 2018-07-23: 40 mg via ORAL
  Filled 2018-07-23: qty 2

## 2018-07-23 MED ORDER — ENOXAPARIN SODIUM 40 MG/0.4ML ~~LOC~~ SOLN
40.0000 mg | SUBCUTANEOUS | Status: DC
  Administered 2018-07-23: 40 mg via SUBCUTANEOUS
  Filled 2018-07-23: qty 0.4

## 2018-07-23 MED ORDER — SUCRALFATE 1 G PO TABS
1.0000 g | ORAL_TABLET | Freq: Two times a day (BID) | ORAL | Status: DC
  Administered 2018-07-23 – 2018-07-24 (×3): 1 g via ORAL
  Filled 2018-07-23 (×3): qty 1

## 2018-07-23 MED ORDER — CARVEDILOL 6.25 MG PO TABS
6.2500 mg | ORAL_TABLET | Freq: Two times a day (BID) | ORAL | Status: DC
  Administered 2018-07-23 – 2018-07-24 (×2): 6.25 mg via ORAL
  Filled 2018-07-23 (×2): qty 1

## 2018-07-23 MED ORDER — DARIFENACIN HYDROBROMIDE ER 7.5 MG PO TB24
7.5000 mg | ORAL_TABLET | Freq: Every day | ORAL | Status: DC
  Administered 2018-07-23 – 2018-07-24 (×2): 7.5 mg via ORAL
  Filled 2018-07-23 (×2): qty 1

## 2018-07-23 MED ORDER — LEVOTHYROXINE SODIUM 100 MCG PO TABS
100.0000 ug | ORAL_TABLET | Freq: Every day | ORAL | Status: DC
  Administered 2018-07-24: 100 ug via ORAL
  Filled 2018-07-23: qty 1

## 2018-07-23 MED ORDER — CLONIDINE HCL 0.1 MG PO TABS
0.1000 mg | ORAL_TABLET | Freq: Three times a day (TID) | ORAL | Status: DC
  Administered 2018-07-23 – 2018-07-24 (×3): 0.1 mg via ORAL
  Filled 2018-07-23 (×3): qty 1

## 2018-07-23 MED ORDER — IRBESARTAN 150 MG PO TABS
300.0000 mg | ORAL_TABLET | Freq: Every day | ORAL | Status: DC
  Administered 2018-07-23 – 2018-07-24 (×2): 300 mg via ORAL
  Filled 2018-07-23 (×2): qty 2

## 2018-07-23 NOTE — H&P (Addendum)
[facility] NAME: Erica Lane    MR#:  [id number]  DATE OF BIRTH:  03/09/1928  DATE OF ADMISSION:  07/23/2018  PRIMARY CARE PHYSICIAN: Idelle Crouch, MD   REQUESTING/REFERRING PHYSICIAN: Conni Slipper, MD  CHIEF COMPLAINT:   Chief Complaint  Patient presents with  . Gastroesophageal Reflux    HISTORY OF PRESENT ILLNESS:  Erica Lane  is a 84 y.o. female with a known history of hypertension, hyperlipidemia, hypothyroidism who presented to the ED with sore throat.  She states that she has had acid reflux for 5 to 6 years, but it has been getting worse recently.  Last night, she had the worst acid reflux that she has ever had in her life.  She then felt like something was blocking her throat.  She felt like there was a "ball" in the back of her throat.  She noted a lot of mucus in her throat.  She felt like she was not able to breathe and that her throat was closing up.  She denies any chest pain, shortness of breath, nausea, vomiting, diaphoresis.    In the ED, she was mildly hypertensive.  Oxygen saturation was 99% on room air.  Labs and chest x-ray were unremarkable.  EKG showed diffuse ST depressions.  She was given Augmentin and Decadron for uvula swelling.  Hospitalists were called for admission.  PAST MEDICAL HISTORY:   Past Medical History:  Diagnosis Date  . Cancer ([place])    skin  . Heart murmur   . Heartburn   . HLD (hyperlipidemia)   . HTN (hypertension)   . Hyperthyroidism     PAST SURGICAL HISTORY:   Past Surgical History:  Procedure Laterality Date  . broken foot    . EXCISIONAL HEMORRHOIDECTOMY    . GALLBLADDER SURGERY    . MEDIAL PARTIAL KNEE REPLACEMENT    . TOTAL HIP ARTHROPLASTY Right    from crushed hip  . VAGINAL HYSTERECTOMY  1961    SOCIAL HISTORY:   Social History   Tobacco Use  . Smoking status: Never Smoker  . Smokeless tobacco: Never Used  Substance Use Topics  . Alcohol use:  Yes    Comment: occ    FAMILY HISTORY:   Family History  Problem Relation Age of Onset  . Cancer Sister 46       stomach  . Prostate cancer Brother   . Prostate cancer Brother   . Kidney cancer Neg Hx   . Bladder Cancer Neg Hx   . Breast cancer Neg Hx     DRUG ALLERGIES:  No Known Allergies  REVIEW OF SYSTEMS:   Review of Systems  Constitutional: Negative for chills and fever.  HENT: Positive for sore throat. Negative for congestion.   Eyes: Negative for blurred vision and double vision.  Respiratory: Negative for cough and shortness of breath.   Cardiovascular: Negative for chest pain, palpitations and leg swelling.  Gastrointestinal: Positive for heartburn. Negative for abdominal pain, nausea and vomiting.  Genitourinary: Negative for dysuria and urgency.  Musculoskeletal: Negative for back pain and myalgias.  Neurological: Positive for dizziness. Negative for headaches.  Psychiatric/Behavioral: Negative for depression. The patient is not nervous/anxious.     MEDICATIONS AT HOME:   Prior to Admission medications   Medication Sig Start Date End Date Taking? Authorizing Provider  amLODipine (NORVASC) 5 MG tablet  01/11/18  Yes [provider]  aspirin EC 81 MG tablet Take by mouth.  Yes [provider]  calcium-vitamin D (SM CALCIUM 500/VITAMIN D3) 500-400 MG-UNIT tablet Take by mouth.   Yes [provider]  carvedilol (COREG) 6.25 MG tablet Take 6.25 mg by mouth 2 (two) times daily with a meal.    Yes [provider]  levothyroxine (SYNTHROID, LEVOTHROID) 100 MCG tablet TAKE 1 TABLET (100 MCG TOTAL) BY MOUTH ONCE DAILY. TAKE ON AN EMPTY STOMACH WITH A GLASS OF WATER AT LEAST 30-60 MINUTES BEFORE BREAKFAST. 09/21/16  Yes [provider]  lovastatin (MEVACOR) 20 MG tablet Take 20 mg at bedtime by mouth.   Yes [provider]  pantoprazole (PROTONIX) 40 MG tablet Take 40 mg by mouth 2 (two) times daily.    Yes [provider]  ranitidine (ZANTAC) 300 MG tablet TAKE ONE TABLET BY MOUTH EVERY NIGHT 03/12/17  Yes [provider]  sucralfate (CARAFATE) 1 g tablet Take by mouth 2 (two) times daily.    Yes [provider]  telmisartan (MICARDIS) 80 MG tablet Take 80 mg by mouth daily.    Yes [provider]  Trospium Chloride 60 MG CP24 Take 1 capsule (60 mg total) by mouth daily. 06/27/18  Yes McGowan, Larene [place] A, PA-C  Vitamin D, Ergocalciferol, (DRISDOL) 50000 units CAPS capsule TAKE 1 CAPSULE BY MOUTH ONCE A WEEK 12/23/15  Yes [provider]  cloNIDine (CATAPRES) 0.1 MG tablet 1 tablet each AM, 2 tablets each afternoon and 1 tablet at bedtime 11/05/16   [provider]  DIGESTIVE ENZYMES PO Take by mouth.    [provider]  fesoterodine (TOVIAZ) 8 MG TB24 tablet Take 1 tablet (8 mg total) by mouth daily. Patient not taking: Reported on 07/23/2018 06/20/18   Zara Council A, PA-C      VITAL SIGNS:  Blood pressure (!) 156/72, pulse 63, temperature 98.4 F (36.9 C), temperature source Oral, resp. rate 17, height 5\' 3"  (1.6 m), weight 65.5 kg, SpO2 98 %.  PHYSICAL EXAMINATION:  Physical Exam  GENERAL:  84 y.o.-year-old patient lying in the bed with no acute distress.  EYES: Pupils equal, round, reactive to light and accommodation. No scleral icterus. Extraocular muscles intact.  HEENT: Head atraumatic, normocephalic. Oropharynx mildly erythematous, +uvula swelling present NECK:  Supple, no jugular venous distention. No thyroid enlargement, no tenderness.  LUNGS: Normal breath sounds bilaterally, no wheezing, rales,rhonchi or crepitation. No use of accessory muscles of respiration.  CARDIOVASCULAR: RRR, S1, S2 normal. No rubs or gallops. +III/VI systolic heart murmur ABDOMEN: Soft, nontender, nondistended. Bowel sounds present. No organomegaly or mass.  EXTREMITIES: No pedal edema, cyanosis, or clubbing.  NEUROLOGIC: Cranial nerves II through XII are  intact. Muscle strength 5/5 in all extremities. Sensation intact. Gait not checked.  PSYCHIATRIC: The patient is alert and oriented x 3.  SKIN: No obvious rash, lesion, or ulcer.   LABORATORY PANEL:   CBC Recent Labs  Lab 07/23/18 0747  WBC 8.6  HGB 13.1  HCT 37.5  PLT 268   ------------------------------------------------------------------------------------------------------------------  Chemistries  Recent Labs  Lab 07/23/18 0747  NA 136  K 4.1  CL 105  CO2 25  GLUCOSE 126*  BUN 18  CREATININE 0.92  CALCIUM 9.9  AST 21  ALT 18  ALKPHOS 75  BILITOT 0.6   ------------------------------------------------------------------------------------------------------------------  Cardiac Enzymes Recent Labs  Lab 07/23/18 0945  TROPONINI <0.03   ------------------------------------------------------------------------------------------------------------------  RADIOLOGY:  Dg Chest 2 View  Result Date: 07/23/2018 CLINICAL DATA:  Chest pain since last night. EXAM: CHEST - 2 VIEW COMPARISON:  07/09/2013 FINDINGS: There is no focal parenchymal opacity. There is no pleural effusion or pneumothorax. The heart and mediastinal contours are unremarkable. There is a moderate-sized hiatal hernia. Chronic T11 vertebral body compression fracture. Chronic T6 mild vertebral body compression fracture. IMPRESSION: No active cardiopulmonary disease. Electronically Signed   By: Kathreen Devoid   On: 07/23/2018 08:52      IMPRESSION AND PLAN:   Uvulitis- likely due to uncontrolled GERD. Received dexamethasone x 1 in the ED. Also received augmentin, but will stop this because patient is not having any URI symptoms, fevers, or leukocytosis. No signs of peritonsillar abscess. -ENT consult- recommending CT neck -s/p dexamethasone x 1 in the ED -Continue home PPI, H2 blocker, and sulcrafate  Diffuse ST depressions- new, seen on EKG. No active chest pain. Troponins negative x 2. Follows with Dr.  Nehemiah Massed as an outpatient. -Trend troponins -Repeat AM EKG -Continue home aspirin and coreg -Cardiology consult  Hypertension- BPs elevated in the ED -Continue norvasc, coreg, clonidine, telmisartan -Hydralazine prn  Type 2 diabetes- well-controlled. Recent A1c 5.9%. -Sensitive SSI  Hypothyroidism- stable -Continue home synthroid  Hyperlipidemia- stable -Continue home statin  All the records are reviewed and case discussed with ED provider. Management plans discussed with the patient, family and they are in agreement.  CODE STATUS: DNI  TOTAL TIME TAKING CARE OF THIS PATIENT: 45 minutes.    Berna Spare  M.D on 07/23/2018 at 11:51 AM  Between 7am to 6pm - Pager - [phone]  After 6pm go to [email or web address] - Proofreader  [facility]  Office  [phone]  CC: Primary care physician; Idelle Crouch, MD   Note: This dictation was prepared with Dragon dictation along with smaller phrase technology. Any transcriptional errors that result from this process are unintentional.

## 2018-07-23 NOTE — Progress Notes (Signed)
Family Meeting Note  Advance Directive:yes  Today a meeting took place with the Patient.  Patient is able to participate.  The following clinical team members were present during this meeting:MD  The following were discussed:Patient's diagnosis: , Patient's progosis: Unable to determine and Goals for treatment: DNI   Discussed goals of care with patient today. She states that she does not want to be intubated under any circumstances due to her age. She would be interested in BiPAP. She would like to undergo cardiac resuscitation. Will make patient DNI.  Additional follow-up to be provided: prn  Time spent during discussion:20 minutes  Evette Doffing, MD

## 2018-07-23 NOTE — ED Notes (Signed)
Given water

## 2018-07-23 NOTE — ED Notes (Signed)
RN called to give report. RN unable to take report at this time.

## 2018-07-23 NOTE — Consult Note (Signed)
[facility]  CARDIOLOGY CONSULT NOTE  Patient ID: Erica Lane MRN: [id number] DOB/AGE: 21-Apr-1928 84 y.o.  Admit date: 07/23/2018 Referring Physician [place] Primary Physician [place] Primary Cardiologist Nehemiah Massed Reason for Consultation abnormal EKG  HPI: 84 year old female referred for evaluation of abnormal EKG.  Presents to [facility] emergency room with chief complaint of her throat and acid reflux.  She has a history of reflux disease, had worsening reflux, mucus in the back of her throat.  Patient has diagnosis of uvulitis can [place] to reflux.  An ECG revealed sinus rhythm with inferior and lateral ST abnormality.  Review of old ECGs also show evidence for sinus rhythm, left ventricular [place] with nonspecific inferolateral ST abnormalities.  The patient denies chest pain.  Troponin is negative less than 0.032.  2D echocardiogram 09/16/2016 revealed normal left ventricular function, with LVEF grade 55%.  [place] study 08/24/2017 revealed normal left ventricular function, without evidence for scar or ischemia.  Review of systems complete and found to be negative unless listed above     Past Medical History:  Diagnosis Date  . Cancer ([place])    skin  . Heart murmur   . Heartburn   . HLD (hyperlipidemia)   . HTN (hypertension)   . Hyperthyroidism     Past Surgical History:  Procedure Laterality Date  . broken foot    . EXCISIONAL HEMORRHOIDECTOMY    . GALLBLADDER SURGERY    . MEDIAL PARTIAL KNEE REPLACEMENT    . TOTAL HIP ARTHROPLASTY Right    from crushed hip  . VAGINAL HYSTERECTOMY  1961    Medications Prior to Admission  Medication Sig Dispense Refill Last Dose  . amLODipine (NORVASC) 5 MG tablet    07/22/2018 at 1800  . aspirin EC 81 MG tablet Take by mouth.   07/22/2018 at 0800  . calcium-vitamin D (SM CALCIUM 500/VITAMIN D3) 500-400 MG-UNIT tablet Take by mouth.   07/22/2018 at 1800  . carvedilol (COREG) 6.25 MG tablet Take 6.25 mg by mouth 2 (two) times daily  with a meal.    07/22/2018 at 2100  . levothyroxine (SYNTHROID, LEVOTHROID) 100 MCG tablet TAKE 1 TABLET (100 MCG TOTAL) BY MOUTH ONCE DAILY. TAKE ON AN EMPTY STOMACH WITH A GLASS OF WATER AT LEAST 30-60 MINUTES BEFORE BREAKFAST.   07/22/2018 at 0700  . lovastatin (MEVACOR) 20 MG tablet Take 20 mg at bedtime by mouth.   07/22/2018 at 2100  . pantoprazole (PROTONIX) 40 MG tablet Take 40 mg by mouth 2 (two) times daily.    07/22/2018 at 1800  . ranitidine (ZANTAC) 300 MG tablet TAKE ONE TABLET BY MOUTH EVERY NIGHT   07/22/2018 at 2100  . sucralfate (CARAFATE) 1 g tablet Take by mouth 2 (two) times daily.    07/22/2018 at 2100  . telmisartan (MICARDIS) 80 MG tablet Take 80 mg by mouth daily.    07/22/2018 at 0800  . Trospium Chloride 60 MG CP24 Take 1 capsule (60 mg total) by mouth daily. 30 capsule 0 07/22/2018 at 0800  . Vitamin D, Ergocalciferol, (DRISDOL) 50000 units CAPS capsule TAKE 1 CAPSULE BY MOUTH ONCE A WEEK   Past Week at Unknown time  . cloNIDine (CATAPRES) 0.1 MG tablet 1 tablet each AM, 2 tablets each afternoon and 1 tablet at bedtime   Not Taking at Unknown time  . DIGESTIVE ENZYMES PO Take by mouth.   Not Taking at Unknown time  . fesoterodine (TOVIAZ) 8 MG TB24 tablet Take 1 tablet (8 mg total) by mouth  daily. (Patient not taking: Reported on 07/23/2018) 90 tablet 3 Not Taking at Unknown time   Social History   Socioeconomic History  . Marital status: Widowed    Spouse name: Not on file  . Number of children: Not on file  . Years of education: Not on file  . Highest education level: Not on file  Occupational History  . Not on file  Social Needs  . Financial resource strain: Not on file  . Food insecurity:    Worry: Not on file    Inability: Not on file  . Transportation needs:    Medical: Not on file    Non-medical: Not on file  Tobacco Use  . Smoking status: Never Smoker  . Smokeless tobacco: Never Used  Substance and Sexual Activity  . Alcohol use: Yes    Comment: occ  .  Drug use: No  . Sexual activity: Not Currently  Lifestyle  . Physical activity:    Days per week: Not on file    Minutes per session: Not on file  . Stress: Not on file  Relationships  . Social connections:    Talks on phone: Not on file    Gets together: Not on file    Attends religious service: Not on file    Active member of club or organization: Not on file    Attends meetings of clubs or organizations: Not on file    Relationship status: Not on file  . Intimate partner violence:    Fear of current or ex partner: Not on file    Emotionally abused: Not on file    Physically abused: Not on file    Forced sexual activity: Not on file  Other Topics Concern  . Not on file  Social History Narrative  . Not on file    Family History  Problem Relation Age of Onset  . Cancer Sister 45       stomach  . Prostate cancer Brother   . Prostate cancer Brother   . Kidney cancer Neg Hx   . Bladder Cancer Neg Hx   . Breast cancer Neg Hx       Review of systems complete and found to be negative unless listed above      PHYSICAL EXAM  General: Well developed, well nourished, in no acute distress HEENT:  Normocephalic and atramatic Neck:  No JVD.  Lungs: Clear bilaterally to auscultation and percussion. Heart: HRRR . Normal S1 and S2 without gallops or murmurs.  Abdomen: Bowel sounds are positive, abdomen soft and non-tender  Msk:  Back normal, normal gait. Normal strength and tone for age. Extremities: No clubbing, cyanosis or edema.   Neuro: Alert and oriented X 3. Psych:  Good affect, responds appropriately  Labs:   Lab Results  Component Value Date   WBC 8.6 07/23/2018   HGB 13.1 07/23/2018   HCT 37.5 07/23/2018   MCV 91.7 07/23/2018   PLT 268 07/23/2018    Recent Labs  Lab 07/23/18 0747  NA 136  K 4.1  CL 105  CO2 25  BUN 18  CREATININE 0.92  CALCIUM 9.9  PROT 7.0  BILITOT 0.6  ALKPHOS 75  ALT 18  AST 21  GLUCOSE 126*   Lab Results  Component Value  Date   TROPONINI <0.03 07/23/2018   No results found for: CHOL No results found for: HDL No results found for: LDLCALC No results found for: TRIG No results found for: CHOLHDL No results found for:  LDLDIRECT    Radiology: Dg Chest 2 View  Result Date: 07/23/2018 CLINICAL DATA:  Chest pain since last night. EXAM: CHEST - 2 VIEW COMPARISON:  07/09/2013 FINDINGS: There is no focal parenchymal opacity. There is no pleural effusion or pneumothorax. The heart and mediastinal contours are unremarkable. There is a moderate-sized hiatal hernia. Chronic T11 vertebral body compression fracture. Chronic T6 mild vertebral body compression fracture. IMPRESSION: No active cardiopulmonary disease. Electronically Signed   By: Kathreen Devoid   On: 07/23/2018 08:52   Ct Soft Tissue Neck W Contrast  Result Date: 07/23/2018 CLINICAL DATA:  84 y/o F; sore throat, stridor, epiglottitis or tonsillitis suspected. EXAM: CT NECK WITH CONTRAST TECHNIQUE: Multidetector CT imaging of the neck was performed using the standard protocol following the bolus administration of intravenous contrast. CONTRAST:  49mL OMNIPAQUE IOHEXOL 300 MG/ML  SOLN COMPARISON:  None. FINDINGS: Pharynx and larynx: Normal. No mass or swelling. Upper thoracic esophagus is patulous with mild wall thickening. Salivary glands: No inflammation, mass, or stone. Thyroid: Normal. Lymph nodes: None enlarged or abnormal density. Vascular: Calcific atherosclerosis of carotid bifurcation. Mild right proximal ICA stenosis. Aortic atherosclerosis. Limited intracranial: Negative. Visualized orbits: Negative. Mastoids and visualized paranasal sinuses: Small fluid level within the sphenoid sinuses. Visible paranasal sinuses and mastoid air cells are otherwise normally aerated. Skeleton: Moderate spondylosis of the cervical spine with multilevel disc and facet degenerative changes. C2-3 central disc protrusion with anterior cord impingement. Uncovertebral and facet  hypertrophy results in bilateral C3-4 and left C5-6 foraminal stenosis. Right C4-5 erosive facet arthritis. Upper chest: Small calcified granuloma within the left upper lobe. Other: None. IMPRESSION: 1. Patulous upper thoracic esophagus. Mild concentric wall thickening of the visible esophagus compatible with esophagitis. 2. Moderate spondylosis of the cervical spine with multilevel disc and facet degenerative changes. C2-3 central disc protrusion with anterior cord impingement. Electronically Signed   By: Kristine Garbe M.D.   On: 07/23/2018 14:08    EKG: Normal sinus rhythm, inferolateral ST abnormality  ASSESSMENT AND PLAN:   1.  Abnormal ECG, inferolateral ST abnormality of uncertain clinical significance, in the absence of chest pain, with negative troponin, with recent negative Lexiscan Myoview.  The patient looks clinically hemodynamically stable, without cardiovascular complaints. 2. Uvulitis  Recommendations  1.  Agree with overall current therapy 2.  Observe on telemetry overnight 3.  Repeat ECG in a.m. 4.  Defer full dose anticoagulation 5.  Defer cardiac diagnostics at this time 6.  If patient does well overnight consider discharge in a.m.  Signed: Isaias Cowman MD,PhD, [facility] 07/23/2018, 2:44 PM

## 2018-07-23 NOTE — ED Triage Notes (Signed)
Pt to ed with c/o severe indigestion last night that caused her to feel like she couldn't breathe due to the phlegm that came up into her throat and head. Pt states this am sore throat that feels swollen and difficult to talk. Pt denies CP.

## 2018-07-23 NOTE — ED Provider Notes (Signed)
[facility] Emergency Department Provider Note   ____________________________________________   First MD Initiated Contact with Patient 07/23/18 [phone]     (approximate)  I have reviewed the triage vital signs and the nursing notes.   HISTORY  Chief Complaint Gastroesophageal Reflux    HPI Erica Lane is a 84 y.o. female patient reports burning in her chest that comes on almost every night about an hour and a half after she lays down.  She has a history of reflux and she takes Protonix for it.  Today she also complains of something dangling down the back of her throat.  Is been there all night it is annoying to her.  Had some neck and jaw pain earlier today.  That seems to be better although not gone.  Recheck at 8:00 pain is gone  Past Medical History:  Diagnosis Date  . Cancer ([place])    skin  . Heart murmur   . Heartburn   . HLD (hyperlipidemia)   . HTN (hypertension)   . Hyperthyroidism     Patient Active Problem List   Diagnosis Date Noted  . Dizziness 09/29/2017  . GERD (gastroesophageal reflux disease) 01/13/2017  . Osteoporosis, post-menopausal 01/13/2017  . Mixed incontinence 01/13/2017  . Vaginal atrophy 01/13/2017  . Diabetes mellitus type 2, uncomplicated ([place]) 81/19/1478  . Essential hypertension 11/15/2015  . LVH (left ventricular hypertrophy) due to hypertensive disease, without heart failure 11/15/2015  . Mixed hyperlipidemia 11/15/2015  . Bilateral carotid artery stenosis 10/04/2015  . Hypothyroidism, unspecified 04/02/2014    Past Surgical History:  Procedure Laterality Date  . broken foot    . EXCISIONAL HEMORRHOIDECTOMY    . GALLBLADDER SURGERY    . MEDIAL PARTIAL KNEE REPLACEMENT    . TOTAL HIP ARTHROPLASTY Right    from crushed hip  . VAGINAL HYSTERECTOMY  1961    Prior to Admission medications   Medication Sig Start Date End Date Taking? Authorizing Provider  amLODipine (NORVASC) 5 MG tablet  01/11/18    [provider]  aspirin EC 81 MG tablet Take by mouth.    [provider]  calcium-vitamin D (SM CALCIUM 500/VITAMIN D3) 500-400 MG-UNIT tablet Take by mouth.    [provider]  carvedilol (COREG) 6.25 MG tablet Take by mouth. 09/21/16 09/21/17  [provider]  cloNIDine (CATAPRES) 0.1 MG tablet 1 tablet each AM, 2 tablets each afternoon and 1 tablet at bedtime 11/05/16   [provider]  DIGESTIVE ENZYMES PO Take by mouth.    [provider]  fesoterodine (TOVIAZ) 8 MG TB24 tablet Take 1 tablet (8 mg total) by mouth daily. 06/20/18   McGowan, Larene Beach A, PA-C  levothyroxine (SYNTHROID, LEVOTHROID) 100 MCG tablet TAKE 1 TABLET (100 MCG TOTAL) BY MOUTH ONCE DAILY. TAKE ON AN EMPTY STOMACH WITH A GLASS OF WATER AT LEAST 30-60 MINUTES BEFORE BREAKFAST. 09/21/16   [provider]  lovastatin (MEVACOR) 20 MG tablet Take 20 mg at bedtime by mouth.    [provider]  pantoprazole (PROTONIX) 40 MG tablet Take 40 mg by mouth daily.    [provider]  ranitidine (ZANTAC) 300 MG tablet TAKE ONE TABLET BY MOUTH EVERY NIGHT 03/12/17   [provider]  sucralfate (CARAFATE) 1 g tablet Take by mouth 2 (two) times daily.     [provider]  telmisartan (MICARDIS) 80 MG tablet Take by mouth. 11/24/16 03/16/18  [provider]  Trospium Chloride 60 MG CP24 Take 1 capsule (  60 mg total) by mouth daily. 06/27/18   Zara Council A, PA-C  Vitamin D, Ergocalciferol, (DRISDOL) 50000 units CAPS capsule TAKE 1 CAPSULE BY MOUTH ONCE A WEEK 12/23/15   [provider]    Allergies Patient has no known allergies.  Family History  Problem Relation Age of Onset  . Cancer Sister 57       stomach  . Prostate cancer Brother   . Prostate cancer Brother   . Kidney cancer Neg Hx   . Bladder Cancer Neg Hx   . Breast cancer Neg Hx     Social History Social History   Tobacco Use  . Smoking status: Never Smoker    . Smokeless tobacco: Never Used  Substance Use Topics  . Alcohol use: Yes    Comment: occ  . Drug use: No    Review of Systems  Constitutional: No fever/chills Eyes: No visual changes. ENT: No sore throat. Cardiovascular: Denies chest pain. Respiratory: Denies shortness of breath. Gastrointestinal: No abdominal pain.  No nausea, no vomiting.  No diarrhea.  No constipation. Genitourinary: Negative for dysuria. Musculoskeletal: Negative for back pain. Skin: Negative for rash. Neurological: Negative for headaches, focal weakness  ____________________________________________   PHYSICAL EXAM:  VITAL SIGNS: ED Triage Vitals  Enc Vitals Group     BP 07/23/18 0724 (!) 164/77     Pulse Rate 07/23/18 0724 81     Resp 07/23/18 0724 18     Temp 07/23/18 0724 98.4 F (36.9 C)     Temp Source 07/23/18 0724 Oral     SpO2 07/23/18 0724 98 %     Weight 07/23/18 0725 144 lb 6.4 oz (65.5 kg)     Height 07/23/18 0725 5\' 3"  (1.6 m)     Head Circumference --      Peak Flow --      Pain Score 07/23/18 0725 2     Pain Loc --      Pain Edu? --      Excl. in [place]? --     Constitutional: Alert and oriented. Well appearing and in no acute distress. Eyes: Conjunctivae are normal. PERRL. EOMI. Head: Atraumatic. Nose: No congestion/rhinnorhea. Mouth/Throat: Mucous membranes are moist.  Oropharynx non-erythematous, patient's uvula is slightly swollen. Neck: No stridor. Cardiovascular: Normal rate, regular rhythm. Grossly normal heart sounds.  Good peripheral circulation. Respiratory: Normal respiratory effort.  No retractions. Lungs CTAB. Gastrointestinal: Soft and nontender. No distention. No abdominal bruits. No CVA tenderness. Musculoskeletal: No lower extremity tenderness nor edema.   Neurologic:  Normal speech and language. No gross focal neurologic deficits are appreciated.  Skin:  Skin is warm, dry and intact. No rash noted. Psychiatric: Mood and affect are normal. Speech and behavior  are normal.  ____________________________________________   LABS (all labs ordered are listed, but only abnormal results are displayed)  Labs Reviewed  COMPREHENSIVE METABOLIC PANEL - Abnormal; Notable for the following components:      Result Value   Glucose, Bld 126 (*)    GFR calc non Af Amer 56 (*)    All other components within normal limits  TROPONIN I  CBC WITH DIFFERENTIAL/PLATELET  TROPONIN I   ____________________________________________  EKG  EKG read interpreted by me shows normal sinus rhythm at a rate of 72 normal axis there is diffuse ST segment depression minimal ST elevation in aVL and aVR and a hint of ST elevation in V1 Repeat EKG about 5 minutes later shows sinus rhythm at 78 normal axis ST elevation  in all leads is essentially gone ____________________________________________  RADIOLOGY  ED MD interpretation: Chest x-ray shows no acute disease per radiology I reviewed the film  Official radiology report(s): Dg Chest 2 View  Result Date: 07/23/2018 CLINICAL DATA:  Chest pain since last night. EXAM: CHEST - 2 VIEW COMPARISON:  07/09/2013 FINDINGS: There is no focal parenchymal opacity. There is no pleural effusion or pneumothorax. The heart and mediastinal contours are unremarkable. There is a moderate-sized hiatal hernia. Chronic T11 vertebral body compression fracture. Chronic T6 mild vertebral body compression fracture. IMPRESSION: No active cardiopulmonary disease. Electronically Signed   By: Kathreen Devoid   On: 07/23/2018 08:52    ____________________________________________   PROCEDURES  Procedure(s) performed:   Procedures  Critical Care performed:   ____________________________________________   INITIAL IMPRESSION / ASSESSMENT AND PLAN / ED COURSE  Reviewed EKG with Dr. Saunders Revel.  Is very worrisome to him he suggest we admit the patient for further work-up.  He looks in the chart further than I did and found that her cardiologist actually Dr.  Nehemiah Massed.  We will get the hospitalist involved.  Patient is currently not having any discomfort.  She does still complain of something dangling down her throat and causing her to gag.  This looks like her uvula is inflamed a little.  Given her some dexamethasone and Augmentin for that.         ____________________________________________   FINAL CLINICAL IMPRESSION(S) / ED DIAGNOSES  Final diagnoses:  Abnormal EKG  Uvulitis     ED Discharge Orders    None       Note:  This document was prepared using Dragon voice recognition software and may include unintentional dictation errors.    Nena Polio, MD 07/23/18 1100

## 2018-07-23 NOTE — ED Notes (Signed)
Pt to CT. Family updated and CT updated that pt will be transported from CT to room and will call RN. Report given to steve, RN

## 2018-07-23 NOTE — Plan of Care (Signed)
  Problem: Education: Goal: Knowledge of General Education information will improve Description: Including pain rating scale, medication(s)/side effects and non-pharmacologic comfort measures Outcome: Progressing   Problem: Clinical Measurements: Goal: Respiratory complications will improve Outcome: Progressing   Problem: Pain Managment: Goal: General experience of comfort will improve Outcome: Progressing   

## 2018-07-23 NOTE — ED Notes (Addendum)
Pt updated on CT scan ordered and on wait for transport upstairs. Pt calm and cooperative.

## 2018-07-23 NOTE — Plan of Care (Signed)
  Problem: Clinical Measurements: Goal: Diagnostic test results will improve Outcome: Not Progressing Note:  Soft tissue of the neck CT was positive today. Physician to discuss with patient / family, assumably in the morning. No other concerns at this time. Patient pleasant, will continue to monitor. Erica Lane [facility]

## 2018-07-23 NOTE — Consult Note (Addendum)
Erica Lane, [name] [phone] [birth year] Erica Pelt, MD  Reason for Consult: throat swelling  HPI: 84 y.o. Female presented to ED with throat swelling.  Reports had significant reflux last night and this morning her voice was hoarse and her throat seemed to be swollen.  Underwent evaluation in ED and concern for EKG and admitted for Cardiology evaluation.  Given dosage of antibiotics and steroids in ED.  Patient reports improvement in symptoms since being admitted.  Tolerating diet.  Longstanding history of reflux per patient.  Has been taking protonix but taking after eating.  Prior trial of wedge pillow but stopped use.  Allergies: No Known Allergies  ROS: Review of systems normal other than 12 systems except per HPI.  PMH:  Past Medical History:  Diagnosis Date  . Cancer ([place])    skin  . Heart murmur   . Heartburn   . HLD (hyperlipidemia)   . HTN (hypertension)   . Hyperthyroidism     FH:  Family History  Problem Relation Age of Onset  . Cancer Sister 30       stomach  . Prostate cancer Brother   . Prostate cancer Brother   . Kidney cancer Neg Hx   . Bladder Cancer Neg Hx   . Breast cancer Neg Hx     SH:  Social History   Socioeconomic History  . Marital status: Widowed    Spouse name: Not on file  . Number of children: Not on file  . Years of education: Not on file  . Highest education level: Not on file  Occupational History  . Not on file  Social Needs  . Financial resource strain: Not on file  . Food insecurity:    Worry: Not on file    Inability: Not on file  . Transportation needs:    Medical: Not on file    Non-medical: Not on file  Tobacco Use  . Smoking status: Never Smoker  . Smokeless tobacco: Never Used  Substance and Sexual Activity  . Alcohol use: Yes    Comment: occ  . Drug use: No  . Sexual activity: Not Currently  Lifestyle  . Physical activity:    Days per week: Not on file    Minutes per session: Not on file  . Stress: Not  on file  Relationships  . Social connections:    Talks on phone: Not on file    Gets together: Not on file    Attends religious service: Not on file    Active member of club or organization: Not on file    Attends meetings of clubs or organizations: Not on file    Relationship status: Not on file  . Intimate partner violence:    Fear of current or ex partner: Not on file    Emotionally abused: Not on file    Physically abused: Not on file    Forced sexual activity: Not on file  Other Topics Concern  . Not on file  Social History Narrative  . Not on file    PSH:  Past Surgical History:  Procedure Laterality Date  . broken foot    . EXCISIONAL HEMORRHOIDECTOMY    . GALLBLADDER SURGERY    . MEDIAL PARTIAL KNEE REPLACEMENT    . TOTAL HIP ARTHROPLASTY Right    from crushed hip  . VAGINAL HYSTERECTOMY  1961    Physical  Exam:  GEN-CN 2-12 grossly intact and symmetric. EARS-  EAC/TMs normal BL NOSE-  Clear anteriorly  OC/OP-  No masses or lesions.  Normal mucosa with no lesions.  Mildly elongated uvula.. Oral cavity, lips, gums, ororpharynx normal with no masses or lesions.  NECK-  No LAD, normal landmarks RESP- unlabored CARD-  RRR EXT- Skin warm and dry.  CT Neck with contrast-  No abnormal mass or lesion present.  Thickened esophagus consistent with eophagitis  A/P: GERD with Esophagitis-  Discussed BID Protonix with patient and husband and proper timing of taking.  Recommend Gaviscon (ordered but not on formulary) post-prandially especially at night.  Recommend strict reflux precautions including wedge pillow.  Consider GI referral as outpatient if this continues.  Doubtful uvulitis, but would continue antibiotics as patient is feeling better after administration.  Do not feel patient needs continued steroids as can exacerbate acid production.  I would also recommend repeat TSH in several months as this will change the timing of her Synthroid  dosage.   Erica Lane 07/23/2018 6:24 PM

## 2018-07-23 NOTE — ED Notes (Signed)
ED TO INPATIENT HANDOFF REPORT  Name/Age/Gender Erica Lane 84 y.o. female  Code Status   Home/SNF/Other Home with husband  Chief Complaint indigestion  Level of Care/Admitting Diagnosis ED Disposition    ED Disposition Condition [place]: [facility] [100120]  Level of Care: Telemetry [5]  Diagnosis: Uvulitis [841324]  Admitting Physician: Hyman Bible DODD [[id number]]  Attending Physician: Hyman Bible DODD [[id number]]  PT Class (Do Not Modify): Observation [104]  PT Acc Code (Do Not Modify): Observation [10022]       Medical History Past Medical History:  Diagnosis Date  . Cancer ([place])    skin  . Heart murmur   . Heartburn   . HLD (hyperlipidemia)   . HTN (hypertension)   . Hyperthyroidism     Allergies No Known Allergies  IV Location/Drains/Wounds Patient Lines/Drains/Airways Status   Active Line/Drains/Airways    Name:   Placement date:   Placement time:   Site:   Days:   Peripheral IV 07/23/18 Left Antecubital   07/23/18    0750    Antecubital   less than 1   Peripheral IV 07/23/18 Right Hand   07/23/18    0751    Hand   less than 1          Labs/Imaging Results for orders placed or performed during the hospital encounter of 07/23/18 (from the past 48 hour(s))  Troponin I - ONCE - STAT     Status: None   Collection Time: 07/23/18  7:47 AM  Result Value Ref Range   Troponin I <0.03 <0.03 ng/mL    Comment: Performed at [facility], [address] [place], [place] [address]  CBC with Differential     Status: None   Collection Time: 07/23/18  7:47 AM  Result Value Ref Range   WBC 8.6 4.0 - 10.5 K/uL   RBC 4.09 3.87 - 5.11 MIL/uL   Hemoglobin 13.1 12.0 - 15.0 g/dL   HCT 37.5 36.0 - 46.0 %   MCV 91.7 80.0 - 100.0 fL   MCH 32.0 26.0 - 34.0 pg   MCHC 34.9 30.0 - 36.0 g/dL   RDW 12.8 11.5 - 15.5 %   Platelets 268 150 - 400 K/uL   nRBC 0.0 0.0 - 0.2 %   Neutrophils Relative % 73 %   Neutro Abs  6.3 1.7 - 7.7 K/uL   Lymphocytes Relative 15 %   Lymphs Abs 1.3 0.7 - 4.0 K/uL   Monocytes Relative 8 %   Monocytes Absolute 0.6 0.1 - 1.0 K/uL   Eosinophils Relative 2 %   Eosinophils Absolute 0.2 0.0 - 0.5 K/uL   Basophils Relative 1 %   Basophils Absolute 0.0 0.0 - 0.1 K/uL   Immature Granulocytes 1 %   Abs Immature Granulocytes 0.06 0.00 - 0.07 K/uL    Comment: Performed at [facility], [address] [place], [place] [address]  Comprehensive metabolic panel     Status: Abnormal   Collection Time: 07/23/18  7:47 AM  Result Value Ref Range   Sodium 136 135 - 145 mmol/L   Potassium 4.1 3.5 - 5.1 mmol/L   Chloride 105 98 - 111 mmol/L   CO2 25 22 - 32 mmol/L   Glucose, Bld 126 (H) 70 - 99 mg/dL   BUN 18 8 - 23 mg/dL   Creatinine, Ser 0.92 0.44 - 1.00 mg/dL   Calcium 9.9 8.9 - 10.3 mg/dL   Total Protein  7.0 6.5 - 8.1 g/dL   Albumin 3.7 3.5 - 5.0 g/dL   AST 21 15 - 41 U/L   ALT 18 0 - 44 U/L   Alkaline Phosphatase 75 38 - 126 U/L   Total Bilirubin 0.6 0.3 - 1.2 mg/dL   GFR calc non Af Amer 56 (L) >60 mL/min   GFR calc Af Amer >60 >60 mL/min   Anion gap 6 5 - 15    Comment: Performed at [facility], [address] [place], [place] [address]  Troponin I - Once     Status: None   Collection Time: 07/23/18  9:45 AM  Result Value Ref Range   Troponin I <0.03 <0.03 ng/mL    Comment: Performed at [facility], [address] [place], [place] [address]   Dg Chest 2 View  Result Date: 07/23/2018 CLINICAL DATA:  Chest pain since last night. EXAM: CHEST - 2 VIEW COMPARISON:  07/09/2013 FINDINGS: There is no focal parenchymal opacity. There is no pleural effusion or pneumothorax. The heart and mediastinal contours are unremarkable. There is a moderate-sized hiatal hernia. Chronic T11 vertebral body compression fracture. Chronic T6 mild vertebral body compression fracture. IMPRESSION: No active cardiopulmonary disease. Electronically Signed   By: Kathreen Devoid   On: 07/23/2018 08:52    Pending Labs Unresulted Labs (From admission, onward)    Start     Ordered   Signed and Held  CBC  (enoxaparin (LOVENOX)    CrCl >/= 30 ml/min)  Once,   R    Comments:  Baseline for enoxaparin therapy IF NOT ALREADY DRAWN.  Notify MD if PLT < 100 K.    Signed and Held   Signed and Held  Creatinine, serum  (enoxaparin (LOVENOX)    CrCl >/= 30 ml/min)  Once,   R    Comments:  Baseline for enoxaparin therapy IF NOT ALREADY DRAWN.    Signed and Held   Signed and Held  Creatinine, serum  (enoxaparin (LOVENOX)    CrCl >/= 30 ml/min)  Weekly,   R    Comments:  while on enoxaparin therapy    Signed and Held   Signed and Held  Basic metabolic panel  Tomorrow morning,   R     Signed and Held   Signed and Held  CBC  Tomorrow morning,   R     Signed and Held   Signed and Held  Troponin I - Now Then Q6H  Now then every 6 hours,   R     Signed and Held          Vitals/Pain Today's Vitals   07/23/18 1100 07/23/18 1130 07/23/18 1230 07/23/18 1300  BP: (!) 169/76 131/74 (!) 155/82 (!) 159/67  Pulse: 69  70 68  Resp: 16 17 (!) 23 15  Temp:      TempSrc:      SpO2: 98%  98% 98%  Weight:      Height:      PainSc:        Isolation Precautions No active isolations  Medications Medications  iohexol (OMNIPAQUE) 300 MG/ML solution 75 mL (has no administration in time range)  dexamethasone (DECADRON) tablet 6 mg (6 mg Oral Given 07/23/18 1116)  amoxicillin-clavulanate (AUGMENTIN) 875-125 MG per tablet 1 tablet (1 tablet Oral Given 07/23/18 1116)    Mobility walks with person assist

## 2018-07-23 NOTE — ED Notes (Signed)
Patient transported to X-ray 

## 2018-07-24 DIAGNOSIS — I10 Essential (primary) hypertension: Secondary | ICD-10-CM | POA: Diagnosis not present

## 2018-07-24 DIAGNOSIS — R9431 Abnormal electrocardiogram [ECG] [EKG]: Secondary | ICD-10-CM | POA: Diagnosis not present

## 2018-07-24 DIAGNOSIS — Z136 Encounter for screening for cardiovascular disorders: Secondary | ICD-10-CM | POA: Diagnosis not present

## 2018-07-24 DIAGNOSIS — K219 Gastro-esophageal reflux disease without esophagitis: Secondary | ICD-10-CM | POA: Diagnosis not present

## 2018-07-24 LAB — CBC
HCT: 34.9 % — ABNORMAL LOW (ref 36.0–46.0)
Hemoglobin: 11.8 g/dL — ABNORMAL LOW (ref 12.0–15.0)
MCH: 31.3 pg (ref 26.0–34.0)
MCHC: 33.8 g/dL (ref 30.0–36.0)
MCV: 92.6 fL (ref 80.0–100.0)
Platelets: 242 10*3/uL (ref 150–400)
RBC: 3.77 MIL/uL — ABNORMAL LOW (ref 3.87–5.11)
RDW: 12.6 % (ref 11.5–15.5)
WBC: 11.3 10*3/uL — ABNORMAL HIGH (ref 4.0–10.5)
nRBC: 0 % (ref 0.0–0.2)

## 2018-07-24 LAB — BASIC METABOLIC PANEL
Anion gap: 8 (ref 5–15)
BUN: 28 mg/dL — ABNORMAL HIGH (ref 8–23)
CO2: 22 mmol/L (ref 22–32)
Calcium: 9.7 mg/dL (ref 8.9–10.3)
Chloride: 103 mmol/L (ref 98–111)
Creatinine, Ser: 1.28 mg/dL — ABNORMAL HIGH (ref 0.44–1.00)
GFR calc Af Amer: 43 mL/min — ABNORMAL LOW (ref >60)
GFR, EST NON AFRICAN AMERICAN: 37 mL/min — AB (ref >60)
GLUCOSE: 139 mg/dL — AB (ref 70–99)
Potassium: 4 mmol/L (ref 3.5–5.1)
Sodium: 133 mmol/L — ABNORMAL LOW (ref 135–145)

## 2018-07-24 LAB — GLUCOSE, CAPILLARY: GLUCOSE-CAPILLARY: 122 mg/dL — AB (ref 70–99)

## 2018-07-24 MED ORDER — FAMOTIDINE 20 MG PO TABS: 20.0000 mg | ORAL_TABLET | Freq: Every day | ORAL | Status: DC

## 2018-07-24 MED ORDER — ENOXAPARIN SODIUM 30 MG/0.3ML ~~LOC~~ SOLN: 30.0000 mg | SUBCUTANEOUS | Status: DC

## 2018-07-24 NOTE — Plan of Care (Signed)
  Problem: Clinical Measurements: Goal: Ability to maintain clinical measurements within normal limits will improve Outcome: Adequate for Discharge   Problem: Clinical Measurements: Goal: Respiratory complications will improve Outcome: Adequate for Discharge   Problem: Activity: Goal: Risk for activity intolerance will decrease Outcome: Adequate for Discharge

## 2018-07-24 NOTE — Discharge Summary (Signed)
[facility] NAME: Erica Lane    MR#:  [id number]  DATE OF BIRTH:  1928/01/22  DATE OF ADMISSION:  07/23/2018 ADMITTING PHYSICIAN: Sela Hua, MD  DATE OF DISCHARGE: 07/24/2018  PRIMARY CARE PHYSICIAN: Idelle Crouch, MD    ADMISSION DIAGNOSIS:  Uvulitis [K12.2] Abnormal EKG [R94.31]  DISCHARGE DIAGNOSIS:  Active Problems:   Uvular swelling   Uvulitis   SECONDARY DIAGNOSIS:   Past Medical History:  Diagnosis Date  . Cancer ([place])    skin  . Heart murmur   . Heartburn   . HLD (hyperlipidemia)   . HTN (hypertension)   . Hyperthyroidism     HOSPITAL COURSE:   84 year old female with a history of reflux and essential hypertension who presented to the ER with worsening of her acid reflux and sore throat..  1.  GERD with esophagitis: Patient was evaluated by ENT physician.  She had a CT of the neck that showed no abnormal mass or lesion.  She has a thickened esophagus consistent with esophagitis.  She will be on PPI twice daily.  Recommendation is for Gaviscon postprandially but this is not available.  Patient will continue with strict reflux precautions.  We did give her information regarding anti-reflux/heartburn diet. Recommendation also to repeat TSH in several months as this will change the timing of her Synthroid dosage.  2.  Abnormal EKG with inferolateral ST abnormality of uncertain clinical significance: Patient was eval by cardiology.  Her troponins were negative.  She had a recent negative Lexiscan Myoview.  Patient is hemodynamically significant without any cardiovascular complaints. Repeat EKG reveals nonspecific inferolateral ST abnormality similar to what was observed on prior EKG on November 23. No more further cardiac work-up as per cardiology.  3.  Essential hypertension: Patient will continue on Avapro Norvasc, Coreg, clonidine  4.  Hypothyroidism: Continue Synthroid and recheck TFTs in 3  weeks  5.  Hyperlipidemia: Continue statin DISCHARGE CONDITIONS AND DIET:  Patient is stable for discharge on heart healthy diet  CONSULTS OBTAINED:  Treatment Team:  Isaias Cowman, MD Carloyn Manner, MD  DRUG ALLERGIES:  No Known Allergies  DISCHARGE MEDICATIONS:   Allergies as of 07/24/2018   No Known Allergies     Medication List    TAKE these medications   amLODipine 5 MG tablet Commonly known as:  NORVASC   aspirin EC 81 MG tablet Take by mouth.   carvedilol 6.25 MG tablet Commonly known as:  COREG Take 6.25 mg by mouth 2 (two) times daily with a meal.   cloNIDine 0.1 MG tablet Commonly known as:  CATAPRES 1 tablet each AM, 2 tablets each afternoon and 1 tablet at bedtime   DIGESTIVE ENZYMES PO Take by mouth.   fesoterodine 8 MG Tb24 tablet Commonly known as:  TOVIAZ Take 1 tablet (8 mg total) by mouth daily.   levothyroxine 100 MCG tablet Commonly known as:  SYNTHROID, LEVOTHROID TAKE 1 TABLET (100 MCG TOTAL) BY MOUTH ONCE DAILY. TAKE ON AN EMPTY STOMACH WITH A GLASS OF WATER AT LEAST 30-60 MINUTES BEFORE BREAKFAST.   lovastatin 20 MG tablet Commonly known as:  MEVACOR Take 20 mg at bedtime by mouth.   pantoprazole 40 MG tablet Commonly known as:  PROTONIX Take 40 mg by mouth 2 (two) times daily.   ranitidine 300 MG tablet Commonly known as:  ZANTAC TAKE ONE TABLET BY MOUTH EVERY NIGHT   SM CALCIUM 500/VITAMIN D3 500-400 MG-UNIT tablet Generic drug:  calcium-vitamin D Take by mouth.   sucralfate 1 g tablet Commonly known as:  CARAFATE Take by mouth 2 (two) times daily.   telmisartan 80 MG tablet Commonly known as:  MICARDIS Take 80 mg by mouth daily.   Trospium Chloride 60 MG Cp24 Take 1 capsule (60 mg total) by mouth daily.   Vitamin D (Ergocalciferol) 1.25 MG (50000 UT) Caps capsule Commonly known as:  DRISDOL TAKE 1 CAPSULE BY MOUTH ONCE A WEEK         Today   CHIEF COMPLAINT:  Doing well no issues  overnight   VITAL SIGNS:  Blood pressure (!) 117/59, pulse 63, temperature 98.2 F (36.8 C), temperature source Oral, resp. rate 18, height 5\' 3"  (1.6 m), weight 63.8 kg, SpO2 100 %.   REVIEW OF SYSTEMS:  Review of Systems  Constitutional: Negative.  Negative for chills, fever and malaise/fatigue.  HENT: Negative.  Negative for ear discharge, ear pain, hearing loss, nosebleeds and sore throat.   Eyes: Negative.  Negative for blurred vision and pain.  Respiratory: Negative.  Negative for cough, hemoptysis, shortness of breath and wheezing.   Cardiovascular: Negative.  Negative for chest pain, palpitations and leg swelling.  Gastrointestinal: Negative.  Negative for abdominal pain, blood in stool, diarrhea, nausea and vomiting.  Genitourinary: Negative.  Negative for dysuria.  Musculoskeletal: Negative.  Negative for back pain.  Skin: Negative.   Neurological: Negative for dizziness, tremors, speech change, focal weakness, seizures and headaches.  Endo/Heme/Allergies: Negative.  Does not bruise/bleed easily.  Psychiatric/Behavioral: Negative.  Negative for depression, hallucinations and suicidal ideas.     PHYSICAL EXAMINATION:  GENERAL:  84 y.o.-year-old patient lying in the bed with no acute distress.  NECK:  Supple, no jugular venous distention. No thyroid enlargement, no tenderness.  LUNGS: Normal breath sounds bilaterally, no wheezing, rales,rhonchi  No use of accessory muscles of respiration.  CARDIOVASCULAR: S1, S2 normal. No murmurs, rubs, or gallops.  ABDOMEN: Soft, non-tender, non-distended. Bowel sounds present. No organomegaly or mass.  EXTREMITIES: No pedal edema, cyanosis, or clubbing.  PSYCHIATRIC: The patient is alert and oriented x 3.  SKIN: No obvious rash, lesion, or ulcer.   DATA REVIEW:   CBC Recent Labs  Lab 07/24/18 0311  WBC 11.3*  HGB 11.8*  HCT 34.9*  PLT 242    Chemistries  Recent Labs  Lab 07/23/18 0747 07/24/18 0311  NA 136 133*  K 4.1  4.0  CL 105 103  CO2 25 22  GLUCOSE 126* 139*  BUN 18 28*  CREATININE 0.92 1.28*  CALCIUM 9.9 9.7  AST 21  --   ALT 18  --   ALKPHOS 75  --   BILITOT 0.6  --     Cardiac Enzymes Recent Labs  Lab 07/23/18 0945 07/23/18 1556 07/23/18 2219  TROPONINI <0.03 <0.03 <0.03    Microbiology Results  @[email or web address] @  RADIOLOGY:  Dg Chest 2 View  Result Date: 07/23/2018 CLINICAL DATA:  Chest pain since last night. EXAM: CHEST - 2 VIEW COMPARISON:  07/09/2013 FINDINGS: There is no focal parenchymal opacity. There is no pleural effusion or pneumothorax. The heart and mediastinal contours are unremarkable. There is a moderate-sized hiatal hernia. Chronic T11 vertebral body compression fracture. Chronic T6 mild vertebral body compression fracture. IMPRESSION: No active cardiopulmonary disease. Electronically Signed   By: Kathreen Devoid   On: 07/23/2018 08:52   Ct Soft Tissue Neck W Contrast  Result Date: 07/23/2018 CLINICAL DATA:  84 y/o F; sore throat, stridor, epiglottitis or tonsillitis  suspected. EXAM: CT NECK WITH CONTRAST TECHNIQUE: Multidetector CT imaging of the neck was performed using the standard protocol following the bolus administration of intravenous contrast. CONTRAST:  23mL OMNIPAQUE IOHEXOL 300 MG/ML  SOLN COMPARISON:  None. FINDINGS: Pharynx and larynx: Normal. No mass or swelling. Upper thoracic esophagus is patulous with mild wall thickening. Salivary glands: No inflammation, mass, or stone. Thyroid: Normal. Lymph nodes: None enlarged or abnormal density. Vascular: Calcific atherosclerosis of carotid bifurcation. Mild right proximal ICA stenosis. Aortic atherosclerosis. Limited intracranial: Negative. Visualized orbits: Negative. Mastoids and visualized paranasal sinuses: Small fluid level within the sphenoid sinuses. Visible paranasal sinuses and mastoid air cells are otherwise normally aerated. Skeleton: Moderate spondylosis of the cervical spine with multilevel disc and facet  degenerative changes. C2-3 central disc protrusion with anterior cord impingement. Uncovertebral and facet hypertrophy results in bilateral C3-4 and left C5-6 foraminal stenosis. Right C4-5 erosive facet arthritis. Upper chest: Small calcified granuloma within the left upper lobe. Other: None. IMPRESSION: 1. Patulous upper thoracic esophagus. Mild concentric wall thickening of the visible esophagus compatible with esophagitis. 2. Moderate spondylosis of the cervical spine with multilevel disc and facet degenerative changes. C2-3 central disc protrusion with anterior cord impingement. Electronically Signed   By: Kristine Garbe M.D.   On: 07/23/2018 14:08      Allergies as of 07/24/2018   No Known Allergies     Medication List    TAKE these medications   amLODipine 5 MG tablet Commonly known as:  NORVASC   aspirin EC 81 MG tablet Take by mouth.   carvedilol 6.25 MG tablet Commonly known as:  COREG Take 6.25 mg by mouth 2 (two) times daily with a meal.   cloNIDine 0.1 MG tablet Commonly known as:  CATAPRES 1 tablet each AM, 2 tablets each afternoon and 1 tablet at bedtime   DIGESTIVE ENZYMES PO Take by mouth.   fesoterodine 8 MG Tb24 tablet Commonly known as:  TOVIAZ Take 1 tablet (8 mg total) by mouth daily.   levothyroxine 100 MCG tablet Commonly known as:  SYNTHROID, LEVOTHROID TAKE 1 TABLET (100 MCG TOTAL) BY MOUTH ONCE DAILY. TAKE ON AN EMPTY STOMACH WITH A GLASS OF WATER AT LEAST 30-60 MINUTES BEFORE BREAKFAST.   lovastatin 20 MG tablet Commonly known as:  MEVACOR Take 20 mg at bedtime by mouth.   pantoprazole 40 MG tablet Commonly known as:  PROTONIX Take 40 mg by mouth 2 (two) times daily.   ranitidine 300 MG tablet Commonly known as:  ZANTAC TAKE ONE TABLET BY MOUTH EVERY NIGHT   SM CALCIUM 500/VITAMIN D3 500-400 MG-UNIT tablet Generic drug:  calcium-vitamin D Take by mouth.   sucralfate 1 g tablet Commonly known as:  CARAFATE Take by mouth 2  (two) times daily.   telmisartan 80 MG tablet Commonly known as:  MICARDIS Take 80 mg by mouth daily.   Trospium Chloride 60 MG Cp24 Take 1 capsule (60 mg total) by mouth daily.   Vitamin D (Ergocalciferol) 1.25 MG (50000 UT) Caps capsule Commonly known as:  DRISDOL TAKE 1 CAPSULE BY MOUTH ONCE A WEEK          Management plans discussed with the patient and dhe is in agreement. Stable for discharge home  Patient should follow up with pcp  CODE STATUS:     Code Status Orders  (From admission, onward)         Start     Ordered   07/23/18 1420  Limited resuscitation (code)  Continuous  Question Answer Comment  In the event of cardiac or respiratory ARREST: Initiate Code Blue, Call Rapid Response Yes   In the event of cardiac or respiratory ARREST: Perform CPR Yes   In the event of cardiac or respiratory ARREST: Perform Intubation/Mechanical Ventilation No   In the event of cardiac or respiratory ARREST: Use NIPPV/BiPAp only if indicated Yes   In the event of cardiac or respiratory ARREST: Administer ACLS medications if indicated Yes   In the event of cardiac or respiratory ARREST: Perform Defibrillation or Cardioversion if indicated Yes      07/23/18 1419        Code Status History    This patient has a current code status but no historical code status.    Advance Directive Documentation     Most Recent Value  Type of Advance Directive  Healthcare Power of [place], Living will [POA = son (Jordan)]  Pre-existing out of facility DNR order (yellow form or pink MOST form)  -  "MOST" Form in Place?  -      TOTAL TIME TAKING CARE OF THIS PATIENT: 38 minutes.    Note: This dictation was prepared with Dragon dictation along with smaller phrase technology. Any transcriptional errors that result from this process are unintentional.  Tiffiney Sparrow M.D on 07/24/2018 at 11:47 AM  Between 7am to 6pm - Pager - [phone] After 6pm go to [email or web address] - password [email or web address]  [facility]  Office  [phone]  CC: Primary care physician; Idelle Crouch, MD

## 2018-07-24 NOTE — Progress Notes (Signed)
[facility]  SUBJECTIVE: Laying in bed, denies chest pain   Vitals:   07/23/18 1429 07/23/18 2041 07/24/18 0423 07/24/18 0817  BP: (!) 160/70 102/80 (!) 143/63 (!) 117/59  Pulse: 76 63 (!) 58 63  Resp: 19 16 18    Temp: 98.4 F (36.9 C) 98 F (36.7 C) (!) 97.5 F (36.4 C) 98.2 F (36.8 C)  TempSrc: Oral Oral Oral Oral  SpO2: 100% 98% 98% 100%  Weight: 63.8 kg     Height: 5\' 3"  (1.6 m)        Intake/Output Summary (Last 24 hours) at 07/24/2018 1017 Last data filed at 07/24/2018 0843 Gross per 24 hour  Intake 360 ml  Output 500 ml  Net -140 ml      PHYSICAL EXAM  General: Well developed, well nourished, in no acute distress HEENT:  Normocephalic and atramatic Neck:  No JVD.  Lungs: Clear bilaterally to auscultation and percussion. Heart: HRRR . Normal S1 and S2 without gallops or murmurs.  Abdomen: Bowel sounds are positive, abdomen soft and non-tender  Msk:  Back normal, normal gait. Normal strength and tone for age. Extremities: No clubbing, cyanosis or edema.   Neuro: Alert and oriented X 3. Psych:  Good affect, responds appropriately   LABS: Basic Metabolic Panel: Recent Labs    07/23/18 0747 07/24/18 0311  NA 136 133*  K 4.1 4.0  CL 105 103  CO2 25 22  GLUCOSE 126* 139*  BUN 18 28*  CREATININE 0.92 1.28*  CALCIUM 9.9 9.7   Liver Function Tests: Recent Labs    07/23/18 0747  AST 21  ALT 18  ALKPHOS 75  BILITOT 0.6  PROT 7.0  ALBUMIN 3.7   No results for input(s): LIPASE, AMYLASE in the last 72 hours. CBC: Recent Labs    07/23/18 0747 07/24/18 0311  WBC 8.6 11.3*  NEUTROABS 6.3  --   HGB 13.1 11.8*  HCT 37.5 34.9*  MCV 91.7 92.6  PLT 268 242   Cardiac Enzymes: Recent Labs    07/23/18 0945 07/23/18 1556 07/23/18 2219  TROPONINI <0.03 <0.03 <0.03   BNP: Invalid input(s): POCBNP D-Dimer: No results for input(s): DDIMER in the last 72 hours. Hemoglobin A1C: No results for input(s): HGBA1C in the last 72 hours. Fasting  Lipid Panel: No results for input(s): CHOL, HDL, LDLCALC, TRIG, CHOLHDL, LDLDIRECT in the last 72 hours. Thyroid Function Tests: No results for input(s): TSH, T4TOTAL, T3FREE, THYROIDAB in the last 72 hours.  Invalid input(s): FREET3 Anemia Panel: No results for input(s): VITAMINB12, FOLATE, FERRITIN, TIBC, IRON, RETICCTPCT in the last 72 hours.  Dg Chest 2 View  Result Date: 07/23/2018 CLINICAL DATA:  Chest pain since last night. EXAM: CHEST - 2 VIEW COMPARISON:  07/09/2013 FINDINGS: There is no focal parenchymal opacity. There is no pleural effusion or pneumothorax. The heart and mediastinal contours are unremarkable. There is a moderate-sized hiatal hernia. Chronic T11 vertebral body compression fracture. Chronic T6 mild vertebral body compression fracture. IMPRESSION: No active cardiopulmonary disease. Electronically Signed   By: Kathreen Devoid   On: 07/23/2018 08:52   Ct Soft Tissue Neck W Contrast  Result Date: 07/23/2018 CLINICAL DATA:  84 y/o F; sore throat, stridor, epiglottitis or tonsillitis suspected. EXAM: CT NECK WITH CONTRAST TECHNIQUE: Multidetector CT imaging of the neck was performed using the standard protocol following the bolus administration of intravenous contrast. CONTRAST:  54mL OMNIPAQUE IOHEXOL 300 MG/ML  SOLN COMPARISON:  None. FINDINGS: Pharynx and larynx: Normal. No mass or swelling. Upper  thoracic esophagus is patulous with mild wall thickening. Salivary glands: No inflammation, mass, or stone. Thyroid: Normal. Lymph nodes: None enlarged or abnormal density. Vascular: Calcific atherosclerosis of carotid bifurcation. Mild right proximal ICA stenosis. Aortic atherosclerosis. Limited intracranial: Negative. Visualized orbits: Negative. Mastoids and visualized paranasal sinuses: Small fluid level within the sphenoid sinuses. Visible paranasal sinuses and mastoid air cells are otherwise normally aerated. Skeleton: Moderate spondylosis of the cervical spine with multilevel disc  and facet degenerative changes. C2-3 central disc protrusion with anterior cord impingement. Uncovertebral and facet hypertrophy results in bilateral C3-4 and left C5-6 foraminal stenosis. Right C4-5 erosive facet arthritis. Upper chest: Small calcified granuloma within the left upper lobe. Other: None. IMPRESSION: 1. Patulous upper thoracic esophagus. Mild concentric wall thickening of the visible esophagus compatible with esophagitis. 2. Moderate spondylosis of the cervical spine with multilevel disc and facet degenerative changes. C2-3 central disc protrusion with anterior cord impingement. Electronically Signed   By: Kristine Garbe M.D.   On: 07/23/2018 14:08     Echo   TELEMETRY: Normal sinus rhythm:  ASSESSMENT AND PLAN:  Active Problems:   Uvular swelling   Uvulitis    1.  Abnormal ECG, inferolateral ST abnormalities of uncertain clinical significance, in the absence of chest pain, with negative troponin, with recent negative Lexiscan Myoview.  Repeat ECG today reveals  nonspecific inferolateral ST abnormalities similar to what was observed on prior ECG 07/09/2013. 2. Uvulitis  Recommendations  1.  Agree with current therapy 2.  Defer further cardiac diagnostics 3.  May discharge home     Isaias Cowman, MD, PhD, [facility] 07/24/2018 9:39 AM

## 2018-07-24 NOTE — Discharge Instructions (Signed)
Please make sure when you take the Carafate is 2 hours before or after any other medication.  This is because the Carafate can act like a sponge and absorb other medications and keep them from working.  Please make sure you take the Protonix at least 30 minutes before meals to make it work better.

## 2018-07-24 NOTE — Progress Notes (Signed)
Pt discharged home this morning, 2 IV's removed VSS, no complaitns at this time, medication regimen instructions provided along with diet restrictions and education to reduce future flare ups.

## 2018-07-28 DIAGNOSIS — L820 Inflamed seborrheic keratosis: Secondary | ICD-10-CM | POA: Diagnosis not present

## 2018-07-28 DIAGNOSIS — L821 Other seborrheic keratosis: Secondary | ICD-10-CM | POA: Diagnosis not present

## 2018-07-28 DIAGNOSIS — L578 Other skin changes due to chronic exposure to nonionizing radiation: Secondary | ICD-10-CM | POA: Diagnosis not present

## 2018-07-28 DIAGNOSIS — L570 Actinic keratosis: Secondary | ICD-10-CM | POA: Diagnosis not present

## 2018-08-22 DIAGNOSIS — K210 Gastro-esophageal reflux disease with esophagitis: Secondary | ICD-10-CM | POA: Diagnosis not present

## 2018-08-22 DIAGNOSIS — I10 Essential (primary) hypertension: Secondary | ICD-10-CM | POA: Diagnosis not present

## 2018-08-22 DIAGNOSIS — E039 Hypothyroidism, unspecified: Secondary | ICD-10-CM | POA: Diagnosis not present

## 2018-10-03 IMAGING — US US BREAST*R* LIMITED INC AXILLA
1 series · 6 of 6 positions shown · non-contrast
Comparison: Previous exam(s).

CLINICAL DATA: Screening recall for a right breast asymmetry.

EXAM:
2D DIGITAL DIAGNOSTIC UNILATERAL RIGHT MAMMOGRAM WITH CAD AND
ADJUNCT TOMO
RIGHT BREAST ULTRASOUND

[Series 1: us breast*right* limited inc axilla · 0.06mm/px · 6 of 6 slices shown]
[im 1/6]
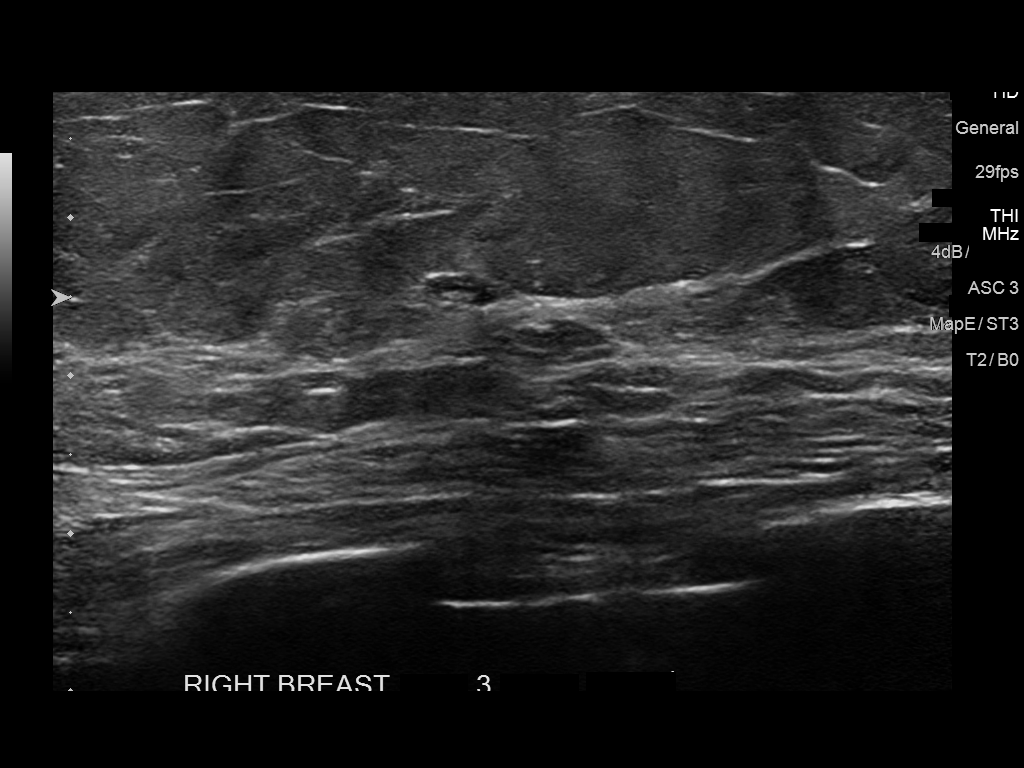
[im 2/6]
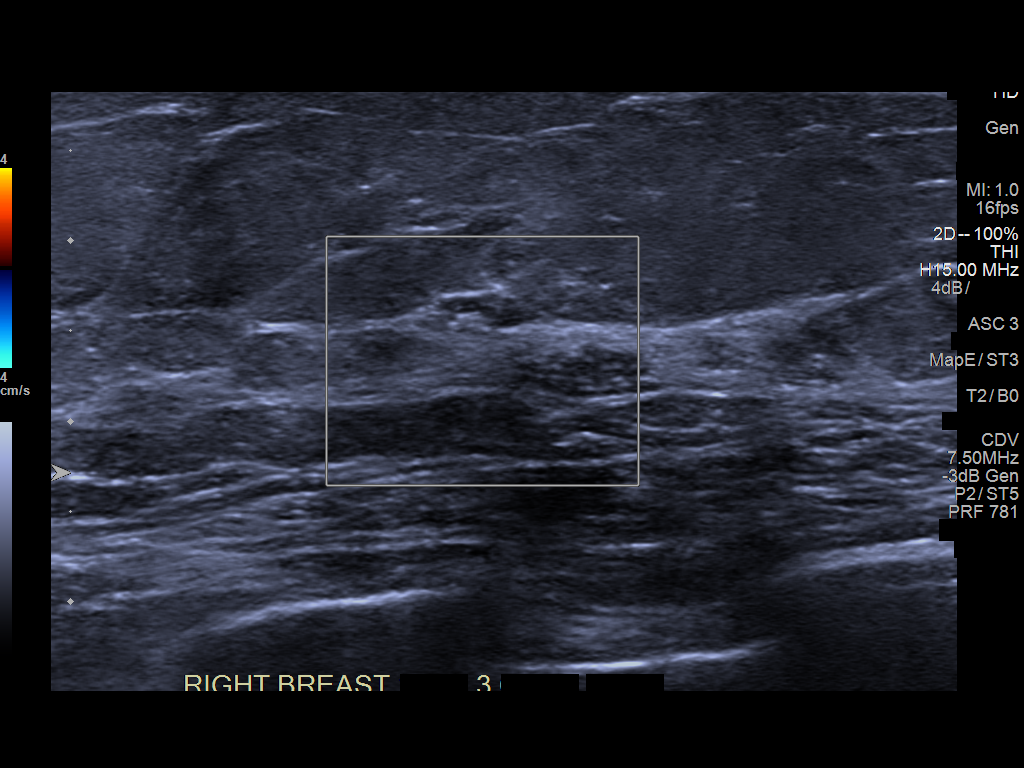
[im 3/6]
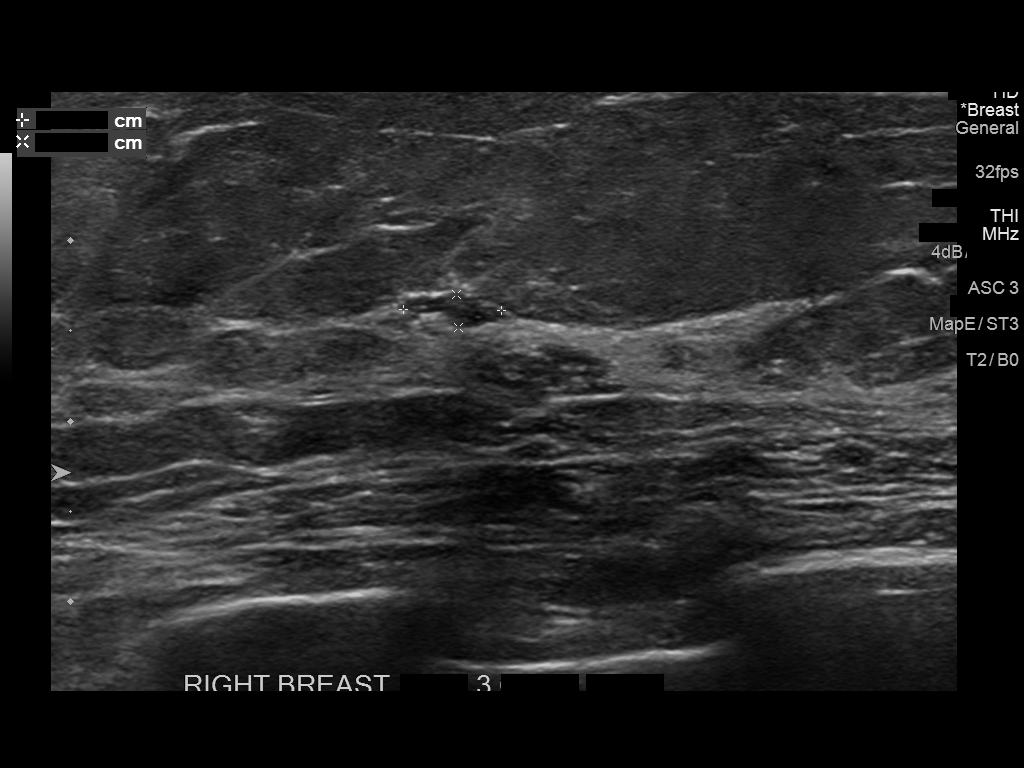
[im 4/6]
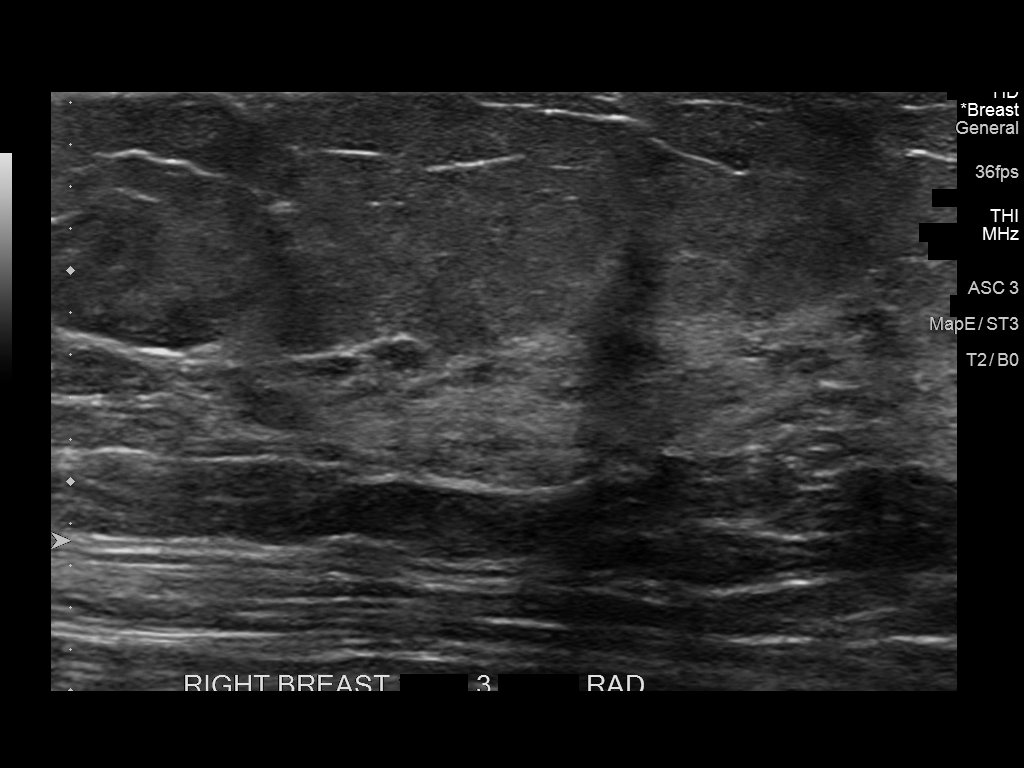
[im 5/6]
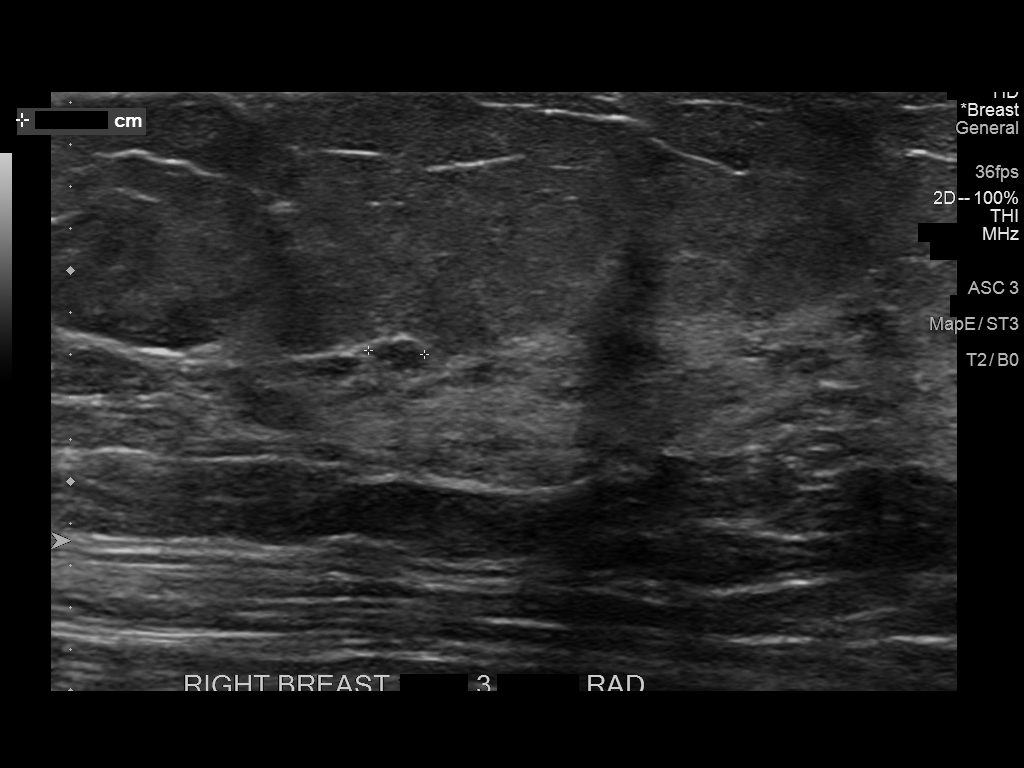
[im 6/6]
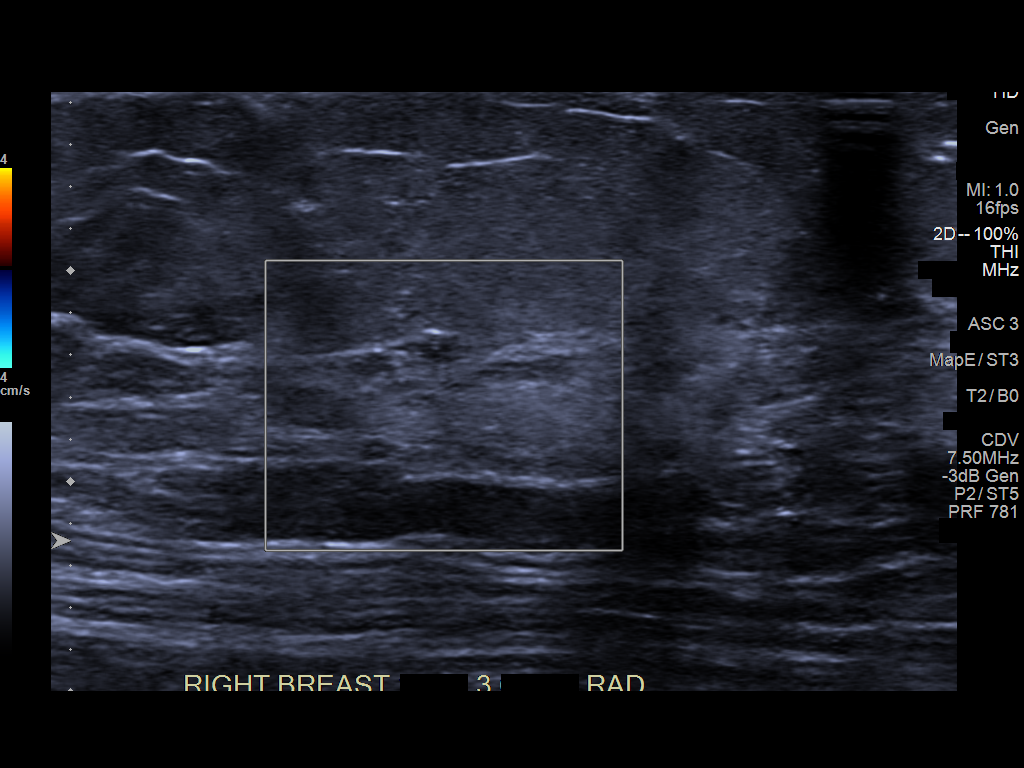

[6 of 6 positions shown; findings below may reference images not displayed]

ACR Breast Density Category b: There are scattered areas of
fibroglandular density.
FINDINGS: In the upper slightly inner quadrant of the right breast there is a
persistent focal asymmetry in the middle depth. However, on the spot
compression tomosynthesis images appears similar to the tissue on
multiple prior mammograms.

Mammographic images were processed with CAD.

Ultrasound of the right breast at 12 o'clock, 3 cm from the nipple
demonstrates a small hypoechoic oval mass measuring 5 x 2 x 3 mm.
IMPRESSION: There is a probably benign mass in the right breast measuring 5 mm.
This likely corresponds with the asymmetry identified
mammographically which has appeared stable on multiple prior
mammograms.

RECOMMENDATION:
Six-month follow-up diagnostic right breast mammogram and ultrasound
is recommended.

I have discussed the findings and recommendations with the patient.
Results were also provided in writing at the conclusion of the
visit. If applicable, a reminder letter will be sent to the patient
regarding the next appointment.

BI-RADS CATEGORY  3: Probably benign.

## 2018-10-06 DIAGNOSIS — Z13820 Encounter for screening for osteoporosis: Secondary | ICD-10-CM | POA: Diagnosis not present

## 2018-10-06 DIAGNOSIS — E039 Hypothyroidism, unspecified: Secondary | ICD-10-CM | POA: Diagnosis not present

## 2018-10-06 DIAGNOSIS — R0989 Other specified symptoms and signs involving the circulatory and respiratory systems: Secondary | ICD-10-CM | POA: Diagnosis not present

## 2018-10-06 DIAGNOSIS — E119 Type 2 diabetes mellitus without complications: Secondary | ICD-10-CM | POA: Diagnosis not present

## 2018-10-06 DIAGNOSIS — I10 Essential (primary) hypertension: Secondary | ICD-10-CM | POA: Diagnosis not present

## 2018-10-06 DIAGNOSIS — E782 Mixed hyperlipidemia: Secondary | ICD-10-CM | POA: Diagnosis not present

## 2018-10-06 DIAGNOSIS — E559 Vitamin D deficiency, unspecified: Secondary | ICD-10-CM | POA: Diagnosis not present

## 2018-10-06 DIAGNOSIS — Z79899 Other long term (current) drug therapy: Secondary | ICD-10-CM | POA: Diagnosis not present

## 2018-10-06 DIAGNOSIS — Z1211 Encounter for screening for malignant neoplasm of colon: Secondary | ICD-10-CM | POA: Diagnosis not present

## 2018-10-06 DIAGNOSIS — Z0000 Encounter for general adult medical examination without abnormal findings: Secondary | ICD-10-CM | POA: Diagnosis not present

## 2018-10-11 DIAGNOSIS — M810 Age-related osteoporosis without current pathological fracture: Secondary | ICD-10-CM | POA: Diagnosis not present

## 2018-10-17 DIAGNOSIS — Z1211 Encounter for screening for malignant neoplasm of colon: Secondary | ICD-10-CM | POA: Diagnosis not present

## 2018-11-03 DIAGNOSIS — J209 Acute bronchitis, unspecified: Secondary | ICD-10-CM | POA: Diagnosis not present

## 2018-12-01 DIAGNOSIS — M25512 Pain in left shoulder: Secondary | ICD-10-CM | POA: Diagnosis not present

## 2018-12-01 DIAGNOSIS — R001 Bradycardia, unspecified: Secondary | ICD-10-CM | POA: Diagnosis not present

## 2018-12-01 DIAGNOSIS — I10 Essential (primary) hypertension: Secondary | ICD-10-CM | POA: Diagnosis not present

## 2018-12-01 DIAGNOSIS — E782 Mixed hyperlipidemia: Secondary | ICD-10-CM | POA: Diagnosis not present

## 2018-12-01 DIAGNOSIS — I119 Hypertensive heart disease without heart failure: Secondary | ICD-10-CM | POA: Diagnosis not present

## 2018-12-13 DIAGNOSIS — I119 Hypertensive heart disease without heart failure: Secondary | ICD-10-CM | POA: Diagnosis not present

## 2018-12-13 DIAGNOSIS — R001 Bradycardia, unspecified: Secondary | ICD-10-CM | POA: Diagnosis not present

## 2018-12-22 DIAGNOSIS — I119 Hypertensive heart disease without heart failure: Secondary | ICD-10-CM | POA: Diagnosis not present

## 2018-12-22 DIAGNOSIS — I10 Essential (primary) hypertension: Secondary | ICD-10-CM | POA: Diagnosis not present

## 2018-12-22 DIAGNOSIS — M25512 Pain in left shoulder: Secondary | ICD-10-CM | POA: Diagnosis not present

## 2018-12-22 DIAGNOSIS — E782 Mixed hyperlipidemia: Secondary | ICD-10-CM | POA: Diagnosis not present

## 2018-12-22 DIAGNOSIS — I6523 Occlusion and stenosis of bilateral carotid arteries: Secondary | ICD-10-CM | POA: Diagnosis not present

## 2018-12-29 IMAGING — CT CT ABD-PEL WO/W CM
1 of 5 series · 8 of 32 positions shown, 13 images · IV contrast (iopamidol)
Comparison: CT 02/16/2008

CLINICAL DATA: Microscopic hematuria.

EXAM:
CT ABDOMEN AND PELVIS WITHOUT AND WITH CONTRAST
TECHNIQUE: Multidetector CT imaging of the abdomen and pelvis was performed
following the standard protocol before and following the bolus
administration of intravenous contrast.
CONTRAST:  125mL [id number]-[id number] IOPAMIDOL ([id number]) INJECTION 61%

[Series 13: axial delay · axial · delayed · 0.72mm/px · z∈[-914,-568]mm · 8 of 89 slices shown, 13 images]
[im 10/89  soft-tissue]
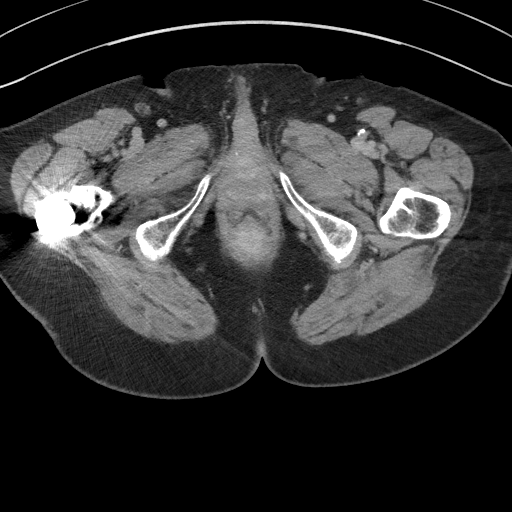
[im 10/89  bone]
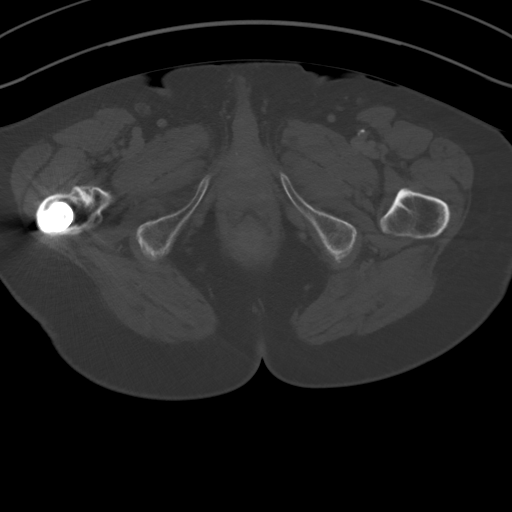
[im 20/89  soft-tissue]
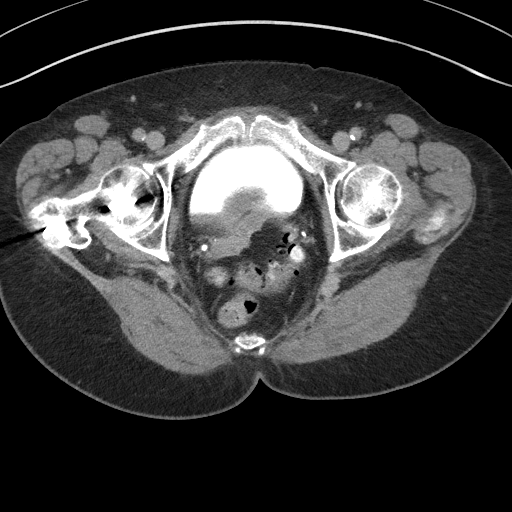
[im 30/89  soft-tissue]
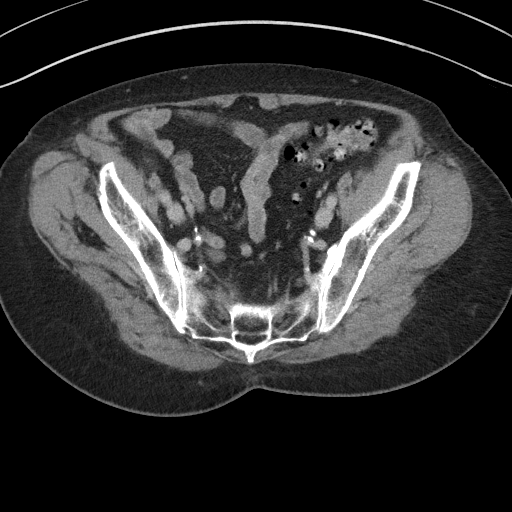
[im 40/89  soft-tissue]
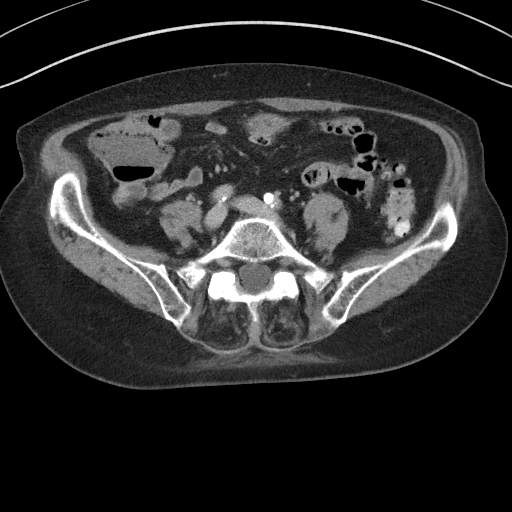
[im 49/89  soft-tissue]
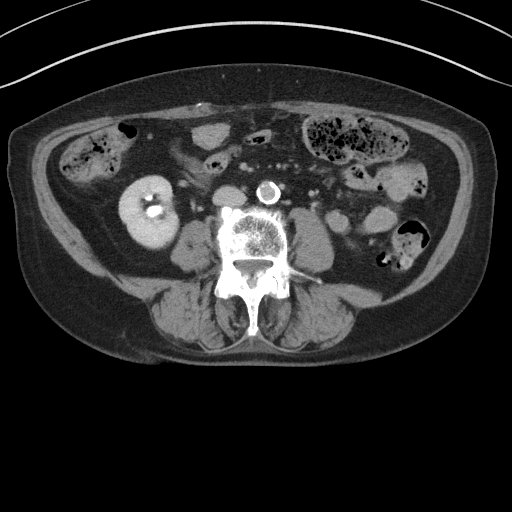
[im 49/89  lung]
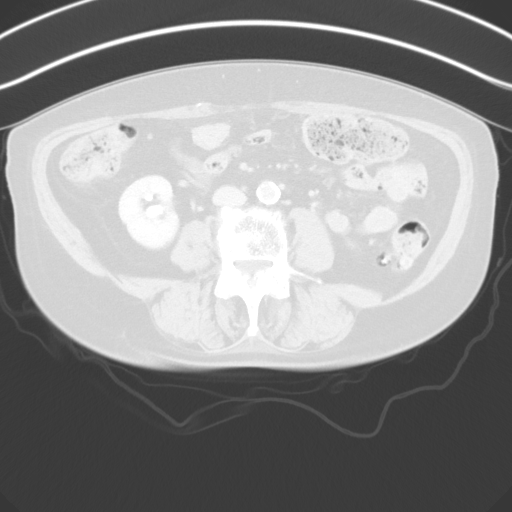
[im 59/89  soft-tissue]
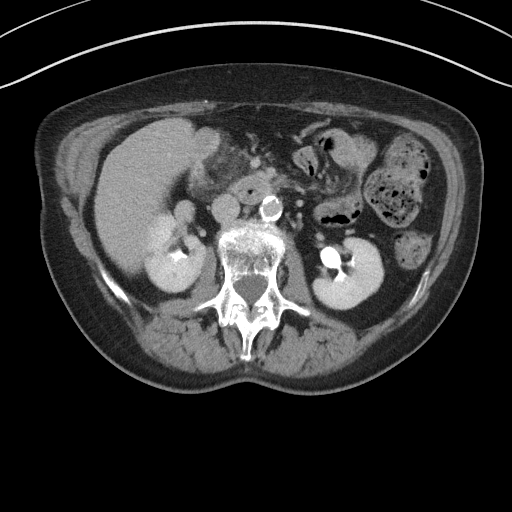
[im 59/89  lung]
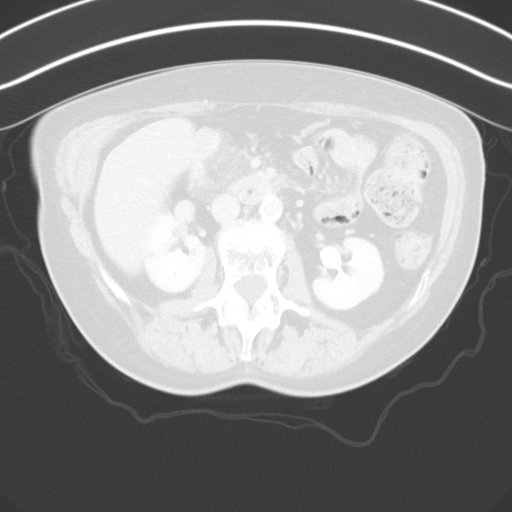
[im 69/89  soft-tissue]
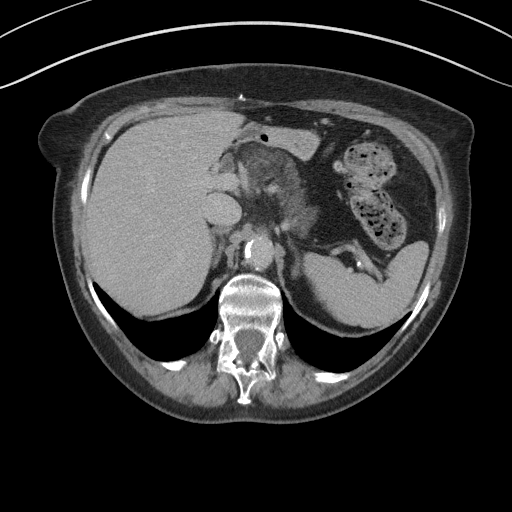
[im 69/89  lung]
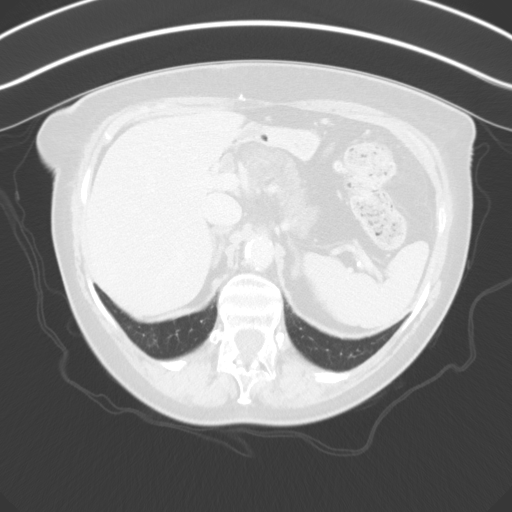
[im 79/89  soft-tissue]
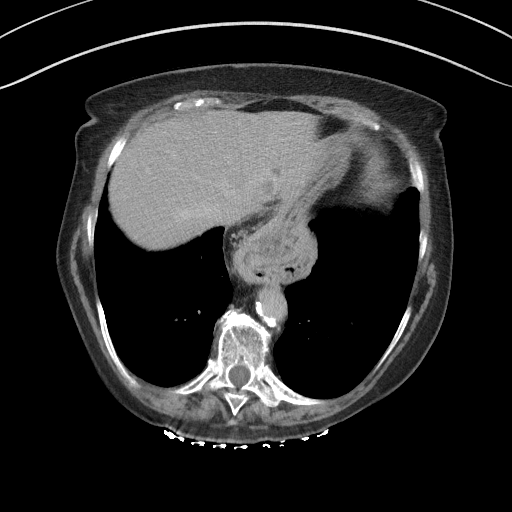
[im 79/89  lung]
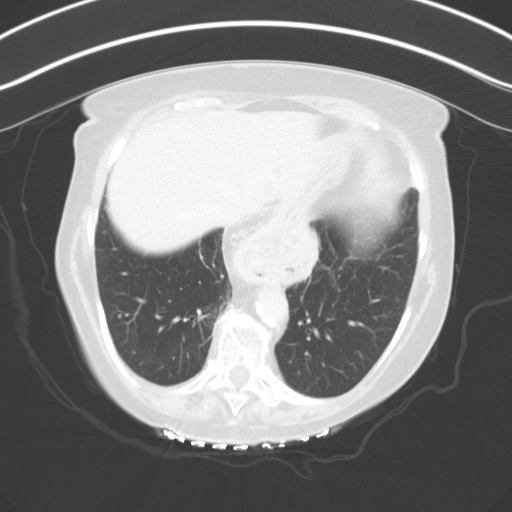

[8 of 32 positions shown; findings below may reference images not displayed]

FINDINGS: Lower chest: Lung bases are clear.

Hepatobiliary: Lesion within the posterior aspect the LEFT lateral
hepatic lobe measures 20 mm (image 10, series 5) is decreased in
size from 27 mm on comparison CT 2449. Lesion has peripheral
enhancement suggesting hemangioma. Gallbladder is not identified and
therefore likely removed surgically. The small enhancing lesion in
the more central RIGHT hepatic lobe on image 10 of series 5 which is
also decreased size from prior suggesting hemangioma. There is no
intrahepatic duct dilatation. Common bile duct measures 7 mm similar
to comparison exam.

Pancreas: The head of the pancreas is fatty replaced and mildly
enlarged similar to comparison exam measuring 2.8 x 2.9 cm compared
to 2.8 x 5 0.9 cm. There is no pancreatic duct dilatation. The body
tail the pancreas abnormal pancreatic density.

Spleen: Normal spleen

Adrenals/urinary tract: Adrenal glands are normal. Nonobstructing
calculus measuring 6 mm in lower pole of the RIGHT kidney. No
ureterolithiasis or obstructive uropathy.

No clear enhancing renal cortical lesion. There is a bilateral small
cortical renal lesions measuring 8 mm on the RIGHT (image 32, series
5) and 5 mm on LEFT which are too small to characterize. These
lesions are increased in size from 2449.

No filling defects the collecting systems or ureters. Extra renal
pelvis on the RIGHT.

No bladder calculi, enhancing bladder lesions, or filling defect
within the bladder.

Stomach/Bowel: Large hiatal hernia. The small bowel and RIGHT colon
normal in multiple diverticular the descending colon sigmoid colon.

Vascular/Lymphatic: Abdominal aorta is normal caliber with
atherosclerotic calcification. There is no retroperitoneal or
periportal lymphadenopathy. No pelvic lymphadenopathy.

Reproductive: Post hysterectomy

Other: No free fluid.

Musculoskeletal: No aggressive osseous lesion. Chronic compression
fracture at T11.
IMPRESSION: 1. Small nonobstructing calculus lower pole of the RIGHT kidney.
2. Small cortical hypodense lesions which are too small to
characterize and increased in size from comparison exam 2449. Favor
benign renal cysts.
3. No filling defects within collecting systems or ureters.
4. No bladder stones or filling defects in the bladder which does
not excluded a bladder lesion.
5. Extensive LEFT colon diverticulosis without diverticulitis.
6. Benign hemangiomas of the liver and benign-appearing fatty
replacement through the pancreatic.
7. Chronic appearing compression fracture of T11.

## 2018-12-30 DIAGNOSIS — I10 Essential (primary) hypertension: Secondary | ICD-10-CM | POA: Diagnosis not present

## 2018-12-30 DIAGNOSIS — E559 Vitamin D deficiency, unspecified: Secondary | ICD-10-CM | POA: Diagnosis not present

## 2018-12-30 DIAGNOSIS — E782 Mixed hyperlipidemia: Secondary | ICD-10-CM | POA: Diagnosis not present

## 2018-12-30 DIAGNOSIS — E119 Type 2 diabetes mellitus without complications: Secondary | ICD-10-CM | POA: Diagnosis not present

## 2018-12-30 DIAGNOSIS — R319 Hematuria, unspecified: Secondary | ICD-10-CM | POA: Diagnosis not present

## 2018-12-30 DIAGNOSIS — E039 Hypothyroidism, unspecified: Secondary | ICD-10-CM | POA: Diagnosis not present

## 2018-12-30 DIAGNOSIS — Z79899 Other long term (current) drug therapy: Secondary | ICD-10-CM | POA: Diagnosis not present

## 2019-01-06 DIAGNOSIS — Z79899 Other long term (current) drug therapy: Secondary | ICD-10-CM | POA: Diagnosis not present

## 2019-01-06 DIAGNOSIS — E782 Mixed hyperlipidemia: Secondary | ICD-10-CM | POA: Diagnosis not present

## 2019-01-06 DIAGNOSIS — E119 Type 2 diabetes mellitus without complications: Secondary | ICD-10-CM | POA: Diagnosis not present

## 2019-01-06 DIAGNOSIS — I10 Essential (primary) hypertension: Secondary | ICD-10-CM | POA: Diagnosis not present

## 2019-01-06 DIAGNOSIS — E039 Hypothyroidism, unspecified: Secondary | ICD-10-CM | POA: Diagnosis not present

## 2019-03-09 DIAGNOSIS — I10 Essential (primary) hypertension: Secondary | ICD-10-CM | POA: Diagnosis not present

## 2019-03-09 DIAGNOSIS — R600 Localized edema: Secondary | ICD-10-CM | POA: Diagnosis not present

## 2019-03-09 DIAGNOSIS — R001 Bradycardia, unspecified: Secondary | ICD-10-CM | POA: Diagnosis not present

## 2019-03-09 DIAGNOSIS — I119 Hypertensive heart disease without heart failure: Secondary | ICD-10-CM | POA: Diagnosis not present

## 2019-03-09 DIAGNOSIS — N183 Chronic kidney disease, stage 3 (moderate): Secondary | ICD-10-CM | POA: Diagnosis not present

## 2019-04-11 DIAGNOSIS — Z79899 Other long term (current) drug therapy: Secondary | ICD-10-CM | POA: Diagnosis not present

## 2019-04-11 DIAGNOSIS — I10 Essential (primary) hypertension: Secondary | ICD-10-CM | POA: Diagnosis not present

## 2019-04-18 DIAGNOSIS — E782 Mixed hyperlipidemia: Secondary | ICD-10-CM | POA: Diagnosis not present

## 2019-04-18 DIAGNOSIS — E119 Type 2 diabetes mellitus without complications: Secondary | ICD-10-CM | POA: Diagnosis not present

## 2019-04-18 DIAGNOSIS — N183 Chronic kidney disease, stage 3 (moderate): Secondary | ICD-10-CM | POA: Diagnosis not present

## 2019-04-18 DIAGNOSIS — I10 Essential (primary) hypertension: Secondary | ICD-10-CM | POA: Diagnosis not present

## 2019-04-18 DIAGNOSIS — E039 Hypothyroidism, unspecified: Secondary | ICD-10-CM | POA: Diagnosis not present

## 2019-04-18 DIAGNOSIS — Z79899 Other long term (current) drug therapy: Secondary | ICD-10-CM | POA: Diagnosis not present

## 2019-07-12 DIAGNOSIS — Z961 Presence of intraocular lens: Secondary | ICD-10-CM | POA: Diagnosis not present

## 2019-07-18 DIAGNOSIS — E119 Type 2 diabetes mellitus without complications: Secondary | ICD-10-CM | POA: Diagnosis not present

## 2019-07-18 DIAGNOSIS — E782 Mixed hyperlipidemia: Secondary | ICD-10-CM | POA: Diagnosis not present

## 2019-07-18 DIAGNOSIS — Z79899 Other long term (current) drug therapy: Secondary | ICD-10-CM | POA: Diagnosis not present

## 2019-07-18 DIAGNOSIS — R8290 Unspecified abnormal findings in urine: Secondary | ICD-10-CM | POA: Diagnosis not present

## 2019-07-18 DIAGNOSIS — E039 Hypothyroidism, unspecified: Secondary | ICD-10-CM | POA: Diagnosis not present

## 2019-07-18 DIAGNOSIS — I10 Essential (primary) hypertension: Secondary | ICD-10-CM | POA: Diagnosis not present

## 2019-07-20 DIAGNOSIS — N1831 Chronic kidney disease, stage 3a: Secondary | ICD-10-CM | POA: Diagnosis not present

## 2019-07-20 DIAGNOSIS — I10 Essential (primary) hypertension: Secondary | ICD-10-CM | POA: Diagnosis not present

## 2019-07-20 DIAGNOSIS — E782 Mixed hyperlipidemia: Secondary | ICD-10-CM | POA: Diagnosis not present

## 2019-07-20 DIAGNOSIS — E119 Type 2 diabetes mellitus without complications: Secondary | ICD-10-CM | POA: Diagnosis not present

## 2019-07-20 DIAGNOSIS — E039 Hypothyroidism, unspecified: Secondary | ICD-10-CM | POA: Diagnosis not present

## 2019-07-24 DIAGNOSIS — L578 Other skin changes due to chronic exposure to nonionizing radiation: Secondary | ICD-10-CM | POA: Diagnosis not present

## 2019-07-24 DIAGNOSIS — L821 Other seborrheic keratosis: Secondary | ICD-10-CM | POA: Diagnosis not present

## 2019-07-24 DIAGNOSIS — L820 Inflamed seborrheic keratosis: Secondary | ICD-10-CM | POA: Diagnosis not present

## 2019-07-24 DIAGNOSIS — L570 Actinic keratosis: Secondary | ICD-10-CM | POA: Diagnosis not present

## 2019-09-26 IMAGING — US US BREAST*R* LIMITED INC AXILLA
1 series · 2 of 2 positions shown · non-contrast
Comparison: Previous exam(s).

CLINICAL DATA: Six-month follow-up for a probably benign right
breast mass.

EXAM:
2D DIGITAL DIAGNOSTIC UNILATERAL RIGHT MAMMOGRAM WITH CAD AND
ADJUNCT TOMO
RIGHT BREAST ULTRASOUND

[Series 1: us breast*right* limited inc axilla · 0.06mm/px · 2 of 2 slices shown]
[im 1/2]
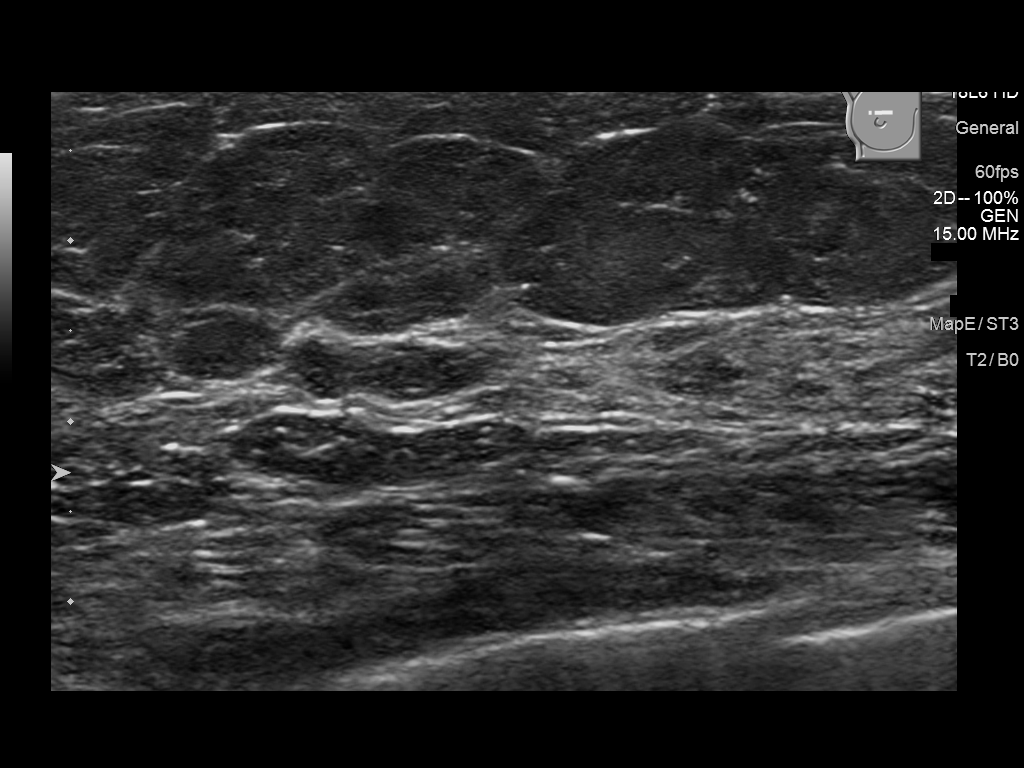
[im 2/2]
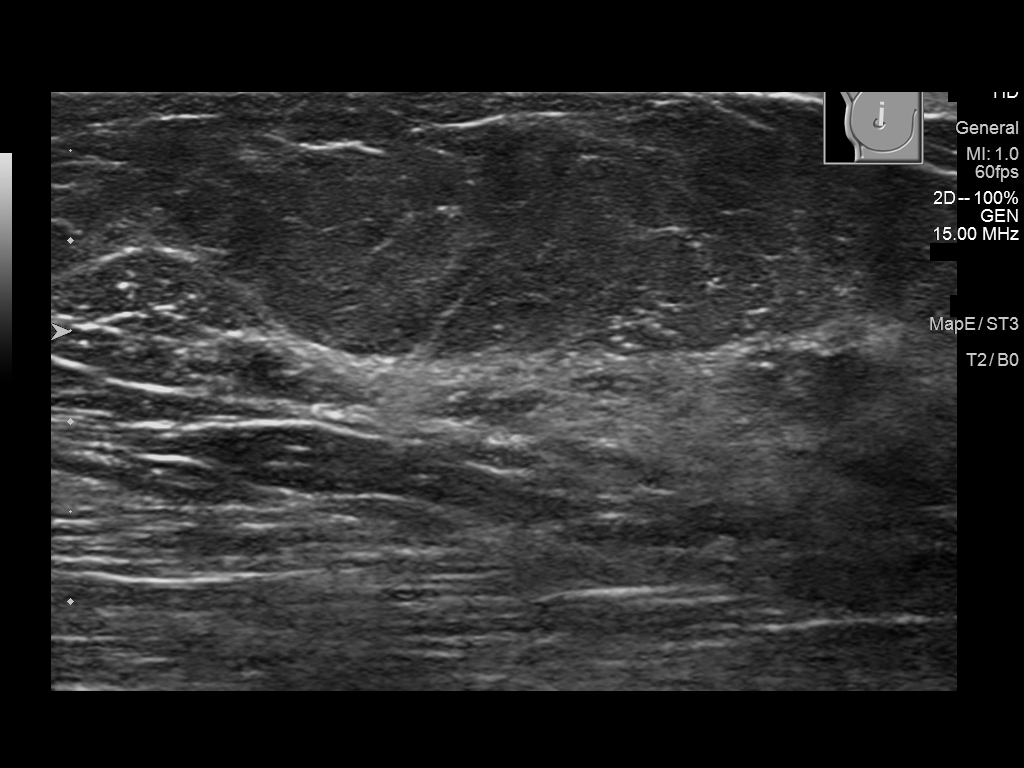

[2 of 2 positions shown; findings below may reference images not displayed]

ACR Breast Density Category c: The breast tissue is heterogeneously
dense, which may obscure small masses.
FINDINGS: No suspicious calcifications, masses or areas of distortion are seen
in the right breast.

Mammographic images were processed with CAD.

The small mass previously seen in the right breast at 12 o'clock, 3
cm from the nipple is no longer visible.
IMPRESSION: 1. Resolution of the small mass identified previously in the right
breast at 12 o'clock.

RECOMMENDATION:
Return to routine screening mammography is recommended. The patient
will be due for screening in Friday December, 2017.

I have discussed the findings and recommendations with the patient.
Results were also provided in writing at the conclusion of the
visit. If applicable, a reminder letter will be sent to the patient
regarding the next appointment.

BI-RADS CATEGORY  1: Negative.

## 2019-10-18 DIAGNOSIS — E782 Mixed hyperlipidemia: Secondary | ICD-10-CM | POA: Diagnosis not present

## 2019-10-18 DIAGNOSIS — I10 Essential (primary) hypertension: Secondary | ICD-10-CM | POA: Diagnosis not present

## 2019-10-18 DIAGNOSIS — Z0000 Encounter for general adult medical examination without abnormal findings: Secondary | ICD-10-CM | POA: Diagnosis not present

## 2019-10-18 DIAGNOSIS — N1831 Chronic kidney disease, stage 3a: Secondary | ICD-10-CM | POA: Diagnosis not present

## 2019-10-18 DIAGNOSIS — Z79899 Other long term (current) drug therapy: Secondary | ICD-10-CM | POA: Diagnosis not present

## 2019-10-18 DIAGNOSIS — E119 Type 2 diabetes mellitus without complications: Secondary | ICD-10-CM | POA: Diagnosis not present

## 2019-10-18 DIAGNOSIS — E039 Hypothyroidism, unspecified: Secondary | ICD-10-CM | POA: Diagnosis not present

## 2019-12-09 IMAGING — CT CT HEAD W/O CM
3 of 4 series · 14 of 47 positions shown, 16 images · non-contrast
Comparison: 08/11/17

CLINICAL DATA: Pt fell [REDACTED] and hit head. No surgery. Pt
c/o episode of complete vision loss 2 weeks in left eye. Pt does c/o
dizziness as well. No other symptoms per pt. Pt did have CT scan of
head [DATE]. No hx of CVA or seizures. MSY

EXAM:
CT HEAD WITHOUT CONTRAST
TECHNIQUE: Contiguous axial images were obtained from the base of the skull
through the vertex without intravenous contrast.

[Series 4: head 2.00 hr68 s3 ax · axial · 0.31mm/px · z∈[-600,-489]mm · 8 of 70 slices shown, 10 images]
[im 7/70  brain]
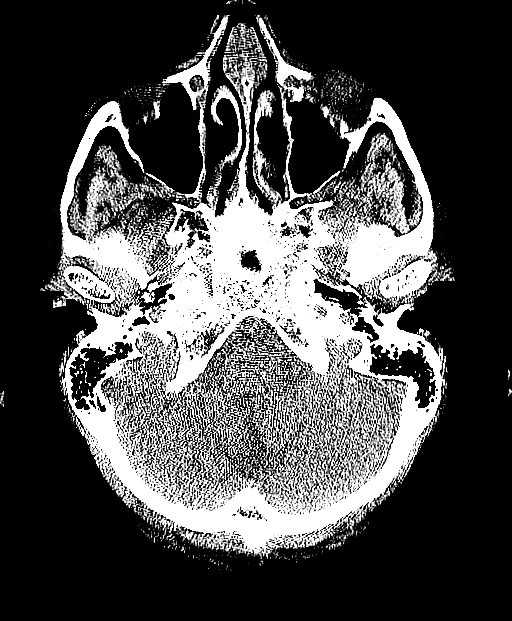
[im 7/70  bone]
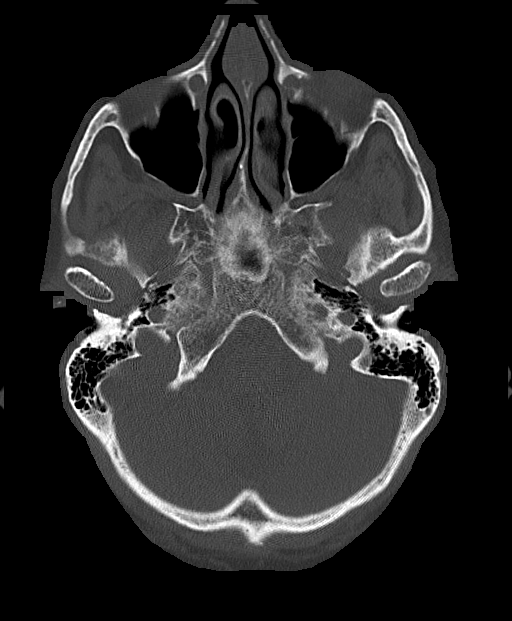
[im 14/70  brain]
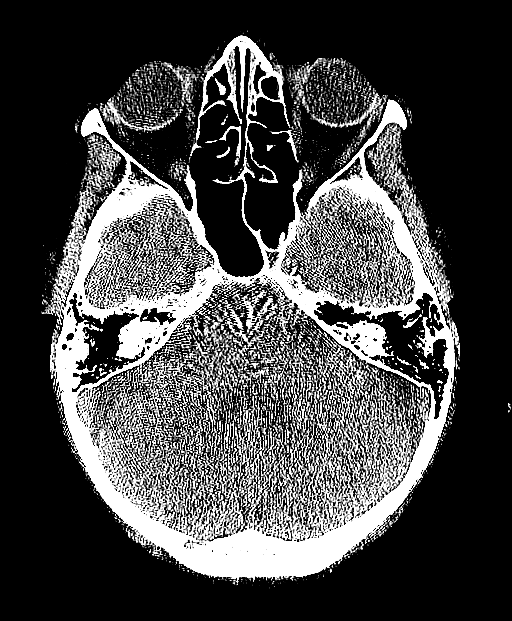
[im 21/70  brain]
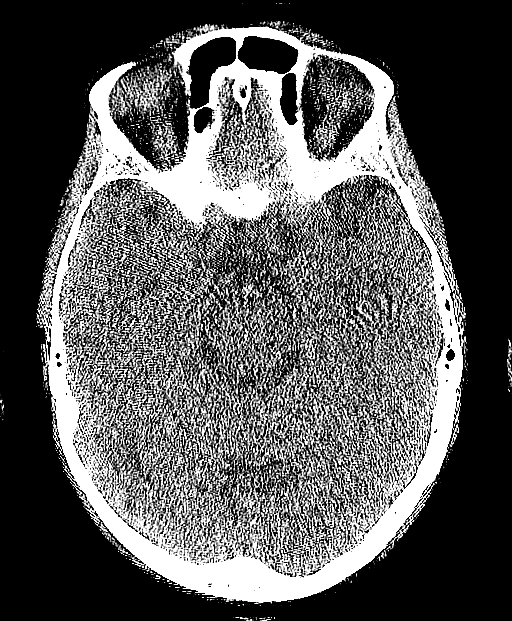
[im 32/70  brain]
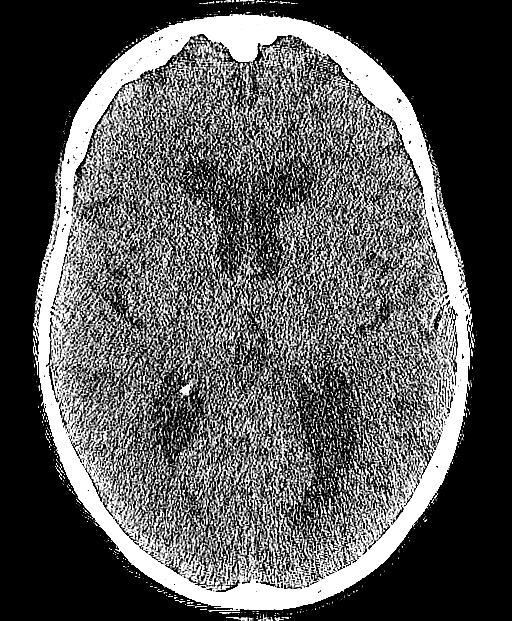
[im 38/70  brain]
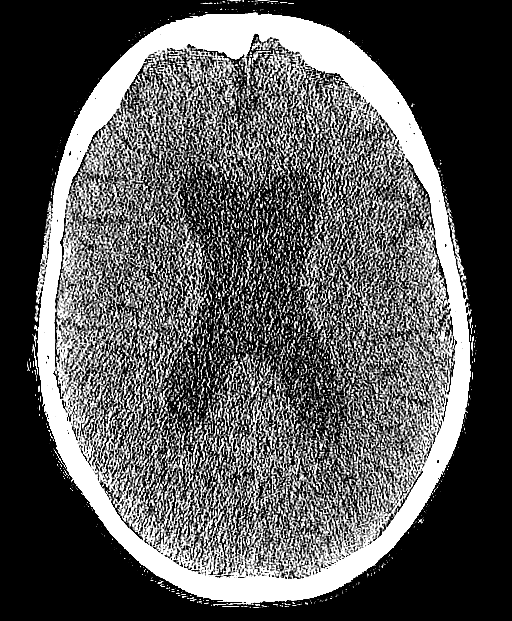
[im 38/70  bone]
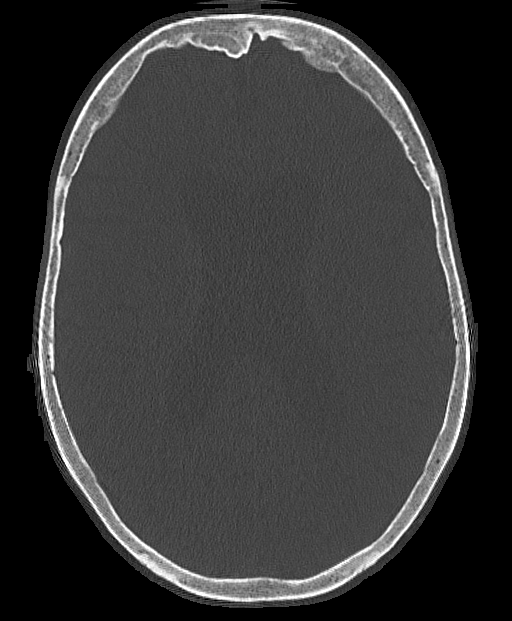
[im 49/70  brain]
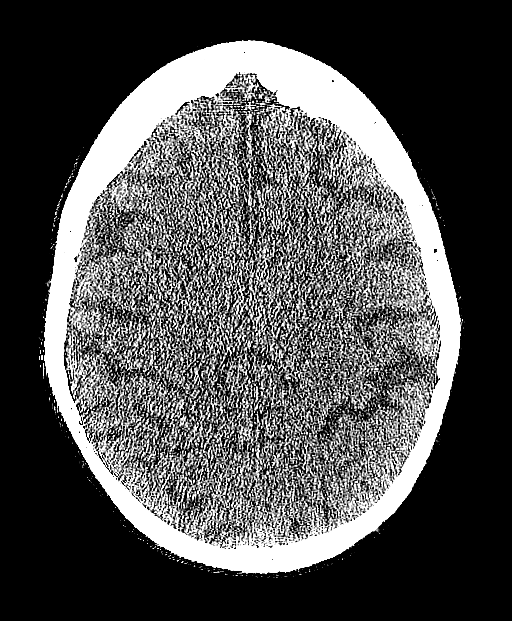
[im 56/70  brain]
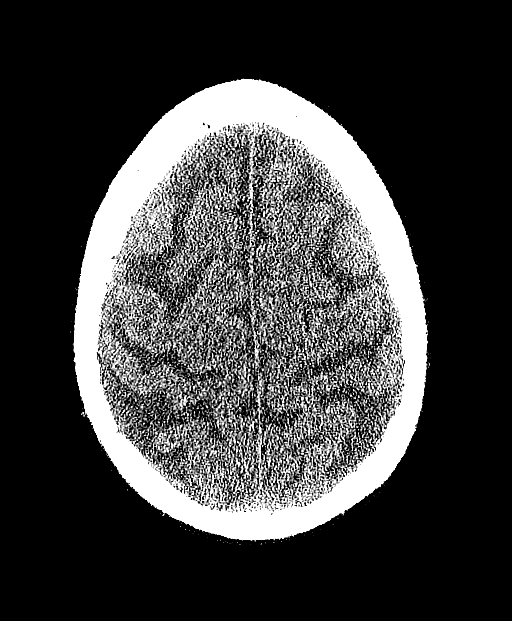
[im 63/70  brain]
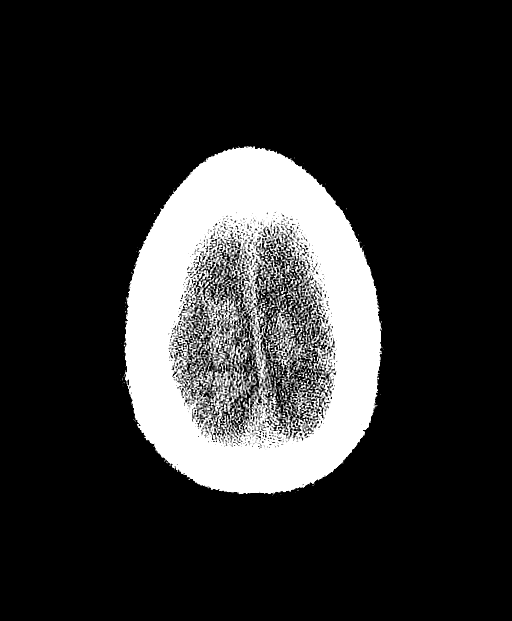

[Series 6: head 3.00 hr40 s3 cor · coronal · 0.28mm/px · 3 of 64 slices shown]
[im 22/64  brain]
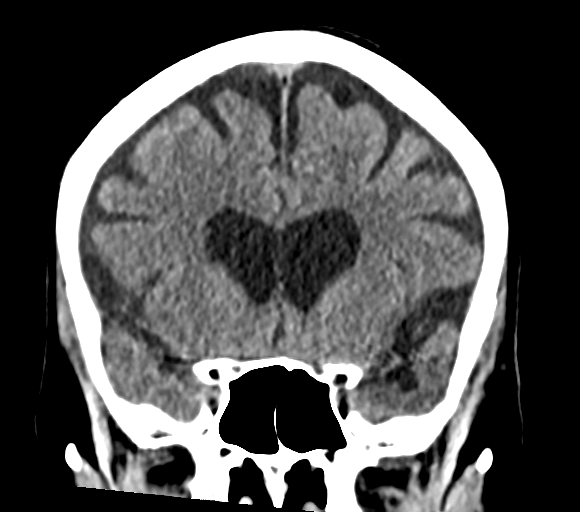
[im 29/64  brain]
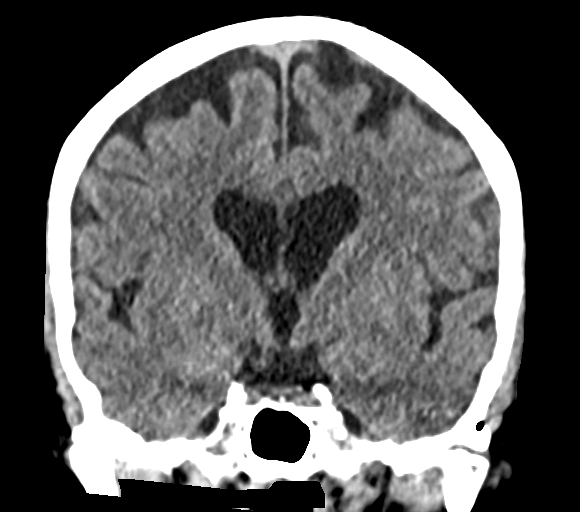
[im 36/64  brain]
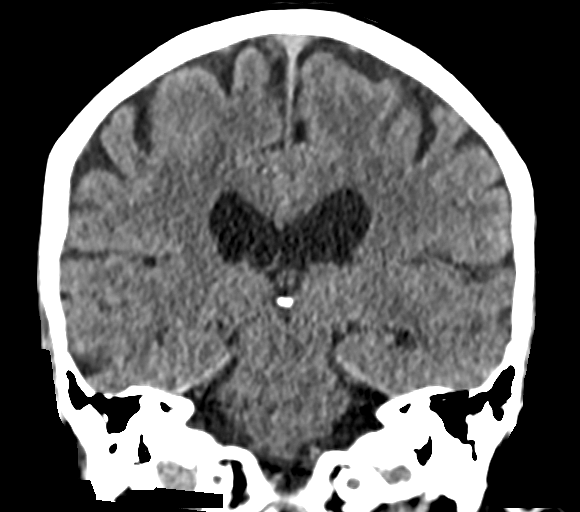

[Series 8: head 3.00 hr40 s3 sag · sagittal · 0.28mm/px · 3 of 53 slices shown]
[im 18/53  brain]
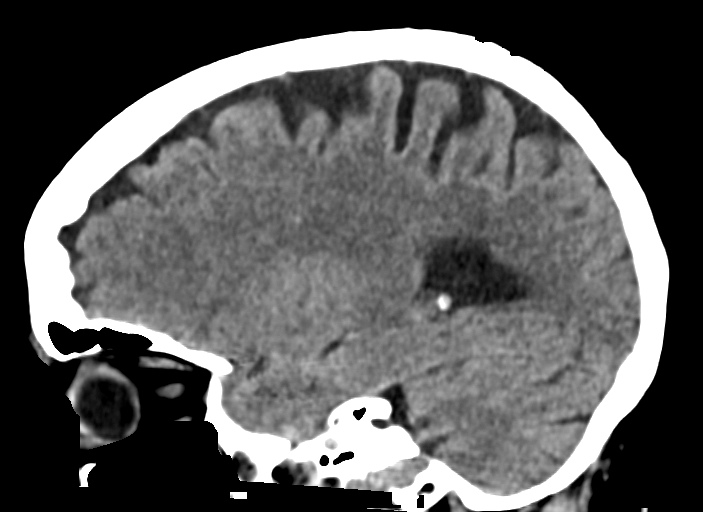
[im 27/53  brain]
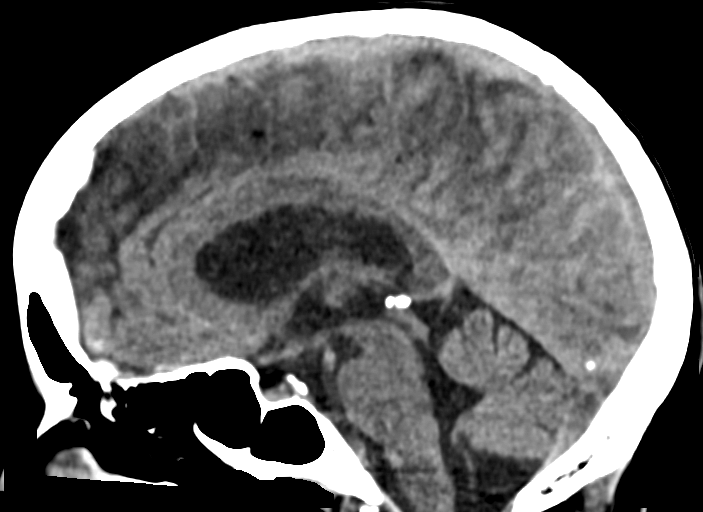
[im 35/53  brain]
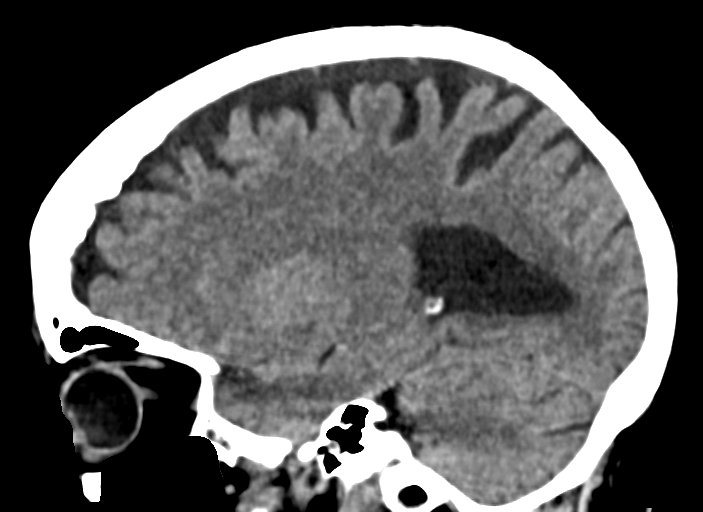

[14 of 47 positions shown; findings below may reference images not displayed]

FINDINGS: Brain: There is a small remote lacunar infarct within the right
basal ganglia. There is no intra or extra-axial fluid collection or
mass lesion. The basilar cisterns and ventricles have a normal
appearance. There is no CT evidence for acute infarction or
hemorrhage.

Vascular: There is dense atherosclerotic calcification of the
internal carotid arteries.

Skull: Normal. Negative for fracture or focal lesion.

Sinuses/Orbits: Interval resolution of air-fluid level within the
right sphenoid sinus.

Other: None
IMPRESSION: 1.  No evidence for acute  abnormality.
2. Chronic small lacunar infarct in the right basal ganglia.

## 2020-01-16 DIAGNOSIS — E039 Hypothyroidism, unspecified: Secondary | ICD-10-CM | POA: Diagnosis not present

## 2020-01-16 DIAGNOSIS — I10 Essential (primary) hypertension: Secondary | ICD-10-CM | POA: Diagnosis not present

## 2020-01-16 DIAGNOSIS — E782 Mixed hyperlipidemia: Secondary | ICD-10-CM | POA: Diagnosis not present

## 2020-01-16 DIAGNOSIS — E119 Type 2 diabetes mellitus without complications: Secondary | ICD-10-CM | POA: Diagnosis not present

## 2020-01-16 DIAGNOSIS — Z79899 Other long term (current) drug therapy: Secondary | ICD-10-CM | POA: Diagnosis not present

## 2020-01-22 DIAGNOSIS — N1831 Chronic kidney disease, stage 3a: Secondary | ICD-10-CM | POA: Diagnosis not present

## 2020-01-22 DIAGNOSIS — E039 Hypothyroidism, unspecified: Secondary | ICD-10-CM | POA: Diagnosis not present

## 2020-01-22 DIAGNOSIS — E119 Type 2 diabetes mellitus without complications: Secondary | ICD-10-CM | POA: Diagnosis not present

## 2020-01-22 DIAGNOSIS — E782 Mixed hyperlipidemia: Secondary | ICD-10-CM | POA: Diagnosis not present

## 2020-01-22 DIAGNOSIS — I10 Essential (primary) hypertension: Secondary | ICD-10-CM | POA: Diagnosis not present

## 2020-05-01 DIAGNOSIS — I10 Essential (primary) hypertension: Secondary | ICD-10-CM | POA: Diagnosis not present

## 2020-05-01 DIAGNOSIS — Z79899 Other long term (current) drug therapy: Secondary | ICD-10-CM | POA: Diagnosis not present

## 2020-05-01 DIAGNOSIS — R42 Dizziness and giddiness: Secondary | ICD-10-CM | POA: Diagnosis not present

## 2020-05-01 DIAGNOSIS — E119 Type 2 diabetes mellitus without complications: Secondary | ICD-10-CM | POA: Diagnosis not present

## 2020-05-01 DIAGNOSIS — E039 Hypothyroidism, unspecified: Secondary | ICD-10-CM | POA: Diagnosis not present

## 2020-05-01 DIAGNOSIS — N1831 Chronic kidney disease, stage 3a: Secondary | ICD-10-CM | POA: Diagnosis not present

## 2020-05-01 DIAGNOSIS — E782 Mixed hyperlipidemia: Secondary | ICD-10-CM | POA: Diagnosis not present

## 2020-05-02 ENCOUNTER — Other Ambulatory Visit: Payer: Self-pay | Admitting: Internal Medicine

## 2020-05-02 DIAGNOSIS — R42 Dizziness and giddiness: Secondary | ICD-10-CM

## 2020-05-13 ENCOUNTER — Other Ambulatory Visit: Payer: Self-pay

## 2020-05-13 ENCOUNTER — Ambulatory Visit
Admission: RE | Admit: 2020-05-13 | Discharge: 2020-05-13 | Disposition: A | Discharge status: Home / Self Care | Payer: PPO | Source: Ambulatory Visit | Attending: Internal Medicine | Admitting: Internal Medicine

## 2020-05-13 DIAGNOSIS — R519 Headache, unspecified: Secondary | ICD-10-CM | POA: Diagnosis not present

## 2020-05-13 DIAGNOSIS — R42 Dizziness and giddiness: Secondary | ICD-10-CM | POA: Insufficient documentation

## 2020-06-03 DIAGNOSIS — I10 Essential (primary) hypertension: Secondary | ICD-10-CM | POA: Diagnosis not present

## 2020-06-03 DIAGNOSIS — E119 Type 2 diabetes mellitus without complications: Secondary | ICD-10-CM | POA: Diagnosis not present

## 2020-06-03 DIAGNOSIS — E782 Mixed hyperlipidemia: Secondary | ICD-10-CM | POA: Diagnosis not present

## 2020-06-03 DIAGNOSIS — E039 Hypothyroidism, unspecified: Secondary | ICD-10-CM | POA: Diagnosis not present

## 2020-06-03 DIAGNOSIS — Z79899 Other long term (current) drug therapy: Secondary | ICD-10-CM | POA: Diagnosis not present

## 2020-07-25 ENCOUNTER — Encounter: Payer: Self-pay | Admitting: Dermatology

## 2020-07-25 ENCOUNTER — Other Ambulatory Visit: Payer: Self-pay

## 2020-07-25 ENCOUNTER — Ambulatory Visit: Payer: PPO | Admitting: Dermatology

## 2020-07-25 DIAGNOSIS — L578 Other skin changes due to chronic exposure to nonionizing radiation: Secondary | ICD-10-CM

## 2020-07-25 DIAGNOSIS — L814 Other melanin hyperpigmentation: Secondary | ICD-10-CM

## 2020-07-25 DIAGNOSIS — D229 Melanocytic nevi, unspecified: Secondary | ICD-10-CM

## 2020-07-25 DIAGNOSIS — L820 Inflamed seborrheic keratosis: Secondary | ICD-10-CM

## 2020-07-25 DIAGNOSIS — D1800 Hemangioma unspecified site: Secondary | ICD-10-CM

## 2020-07-25 DIAGNOSIS — C44729 Squamous cell carcinoma of skin of left lower limb, including hip: Secondary | ICD-10-CM | POA: Diagnosis not present

## 2020-07-25 DIAGNOSIS — L570 Actinic keratosis: Secondary | ICD-10-CM | POA: Diagnosis not present

## 2020-07-25 DIAGNOSIS — Z1283 Encounter for screening for malignant neoplasm of skin: Secondary | ICD-10-CM | POA: Diagnosis not present

## 2020-07-25 DIAGNOSIS — Z872 Personal history of diseases of the skin and subcutaneous tissue: Secondary | ICD-10-CM | POA: Diagnosis not present

## 2020-07-25 DIAGNOSIS — L821 Other seborrheic keratosis: Secondary | ICD-10-CM

## 2020-07-25 DIAGNOSIS — C4492 Squamous cell carcinoma of skin, unspecified: Secondary | ICD-10-CM

## 2020-07-25 DIAGNOSIS — D492 Neoplasm of unspecified behavior of bone, soft tissue, and skin: Secondary | ICD-10-CM

## 2020-07-25 HISTORY — DX: Squamous cell carcinoma of skin, unspecified: C44.92

## 2020-07-25 NOTE — Patient Instructions (Addendum)

## 2020-07-25 NOTE — Progress Notes (Signed)
Follow-Up Visit   Subjective  Erica Lane is a 86 y.o. female who presents for the following: total body skin exam (Hx of AKs). The patient presents for Total-Body Skin Exam (TBSE) for skin cancer screening and mole check.  The following portions of the chart were reviewed this encounter and updated as appropriate:   Tobacco  Allergies  Meds  Problems  Med Hx  Surg Hx  Fam Hx     Review of Systems:  No other skin or systemic complaints except as noted in HPI or Assessment and Plan.  Objective  Well appearing patient in no apparent distress; mood and affect are within normal limits.  A full examination was performed including scalp, head, eyes, ears, nose, lips, neck, chest, axillae, abdomen, back, buttocks, bilateral upper extremities, bilateral lower extremities, hands, feet, fingers, toes, fingernails, and toenails. All findings within normal limits unless otherwise noted below.  Objective  Nose x 1, forehead x 1 (2): Pink scaly macules   Objective  L temple x 1, R zygoma x 2, L hand dorsum x 1 (4): Erythematous keratotic or waxy stuck-on papule or plaque.   Objective  Left proximal pretibial: Hyperkeratotic pap 2.1cm   Assessment & Plan    Lentigines - Scattered tan macules - Discussed due to sun exposure - Benign, observe - Call for any changes  Seborrheic Keratoses - Stuck-on, waxy, tan-brown papules and plaques  - Discussed benign etiology and prognosis. - Observe - Call for any changes  Melanocytic Nevi - Tan-brown and/or pink-flesh-colored symmetric macules and papules - Benign appearing on exam today - Observation - Call clinic for new or changing moles - Recommend daily use of broad spectrum spf 30+ sunscreen to sun-exposed areas.   Hemangiomas - Red papules - Discussed benign nature - Observe - Call for any changes  Actinic Damage - Chronic, secondary to cumulative UV/sun exposure - diffuse scaly erythematous macules with underlying  dyspigmentation - Recommend daily broad spectrum sunscreen SPF 30+ to sun-exposed areas, reapply every 2 hours as needed.  - Call for new or changing lesions.  Skin cancer screening performed today.  AK (actinic keratosis) (2) Nose x 1, forehead x 1  Destruction of lesion - Nose x 1, forehead x 1 Complexity: simple   Destruction method: cryotherapy   Informed consent: discussed and consent obtained   Timeout:  patient name, date of birth, surgical site, and procedure verified Lesion destroyed using liquid nitrogen: Yes   Region frozen until ice ball extended beyond lesion: Yes   Outcome: patient tolerated procedure well with no complications   Post-procedure details: wound care instructions given    Inflamed seborrheic keratosis (4) L temple x 1, R zygoma x 2, L hand dorsum x 1  Destruction of lesion - L temple x 1, R zygoma x 2, L hand dorsum x 1 Complexity: simple   Destruction method: cryotherapy   Informed consent: discussed and consent obtained   Timeout:  patient name, date of birth, surgical site, and procedure verified Lesion destroyed using liquid nitrogen: Yes   Region frozen until ice ball extended beyond lesion: Yes   Outcome: patient tolerated procedure well with no complications   Post-procedure details: wound care instructions given    Neoplasm of skin Left proximal pretibial  Epidermal / dermal shaving  Lesion diameter (cm):  2.1 Informed consent: discussed and consent obtained   Timeout: patient name, date of birth, surgical site, and procedure verified   Procedure prep:  Patient was prepped and draped  in usual sterile fashion Prep type:  Isopropyl alcohol Anesthesia: the lesion was anesthetized in a standard fashion   Anesthetic:  1% lidocaine w/ epinephrine 1-100,000 buffered w/ 8.4% NaHCO3 Instrument used: flexible razor blade   Hemostasis achieved with: pressure, aluminum chloride and electrodesiccation   Outcome: patient tolerated procedure well    Post-procedure details: sterile dressing applied and wound care instructions given   Dressing type: bandage and petrolatum    Destruction of lesion Complexity: extensive   Destruction method: electrodesiccation and curettage   Informed consent: discussed and consent obtained   Timeout:  patient name, date of birth, surgical site, and procedure verified Procedure prep:  Patient was prepped and draped in usual sterile fashion Prep type:  Isopropyl alcohol Anesthesia: the lesion was anesthetized in a standard fashion   Anesthetic:  1% lidocaine w/ epinephrine 1-100,000 buffered w/ 8.4% NaHCO3 Curettage performed in three different directions: Yes   Electrodesiccation performed over the curetted area: Yes   Lesion length (cm):  2.1 Lesion width (cm):  2.1 Margin per side (cm):  0.2 Final wound size (cm):  2.5 Hemostasis achieved with:  pressure, aluminum chloride and electrodesiccation Outcome: patient tolerated procedure well with no complications   Post-procedure details: sterile dressing applied and wound care instructions given   Dressing type: bandage and petrolatum    Specimen 1 - Surgical pathology Differential Diagnosis: D48.5 R/O SCC Check Margins: No Hyperkeratotic pap 2.1cm EDC  Return in about 13 months (around 08/25/2021) for Hx of AKs.   I, Othelia Pulling, RMA, am acting as scribe for Sarina Ser, MD .  Documentation: I have reviewed the above documentation for accuracy and completeness, and I agree with the above.  Sarina Ser, MD

## 2020-07-30 DIAGNOSIS — Z79899 Other long term (current) drug therapy: Secondary | ICD-10-CM | POA: Diagnosis not present

## 2020-07-30 DIAGNOSIS — E039 Hypothyroidism, unspecified: Secondary | ICD-10-CM | POA: Diagnosis not present

## 2020-07-30 DIAGNOSIS — E782 Mixed hyperlipidemia: Secondary | ICD-10-CM | POA: Diagnosis not present

## 2020-07-30 DIAGNOSIS — I10 Essential (primary) hypertension: Secondary | ICD-10-CM | POA: Diagnosis not present

## 2020-07-30 DIAGNOSIS — E119 Type 2 diabetes mellitus without complications: Secondary | ICD-10-CM | POA: Diagnosis not present

## 2020-07-31 ENCOUNTER — Encounter: Payer: Self-pay | Admitting: Dermatology

## 2020-07-31 ENCOUNTER — Telehealth: Payer: Self-pay

## 2020-07-31 NOTE — Telephone Encounter (Signed)
Patient informed of pathology results 

## 2020-07-31 NOTE — Telephone Encounter (Signed)
-----   Message from Ralene Bathe, MD sent at 07/30/2020  9:30 AM EST ----- Diagnosis Skin (M), left proximal pretibial WELL DIFFERENTIATED SQUAMOUS CELL CARCINOMA  Cancer - SCC Already treated Recheck next visit

## 2020-08-07 DIAGNOSIS — N1831 Chronic kidney disease, stage 3a: Secondary | ICD-10-CM | POA: Diagnosis not present

## 2020-08-07 DIAGNOSIS — E039 Hypothyroidism, unspecified: Secondary | ICD-10-CM | POA: Diagnosis not present

## 2020-08-07 DIAGNOSIS — I10 Essential (primary) hypertension: Secondary | ICD-10-CM | POA: Diagnosis not present

## 2020-08-07 DIAGNOSIS — Z79899 Other long term (current) drug therapy: Secondary | ICD-10-CM | POA: Diagnosis not present

## 2020-08-07 DIAGNOSIS — E782 Mixed hyperlipidemia: Secondary | ICD-10-CM | POA: Diagnosis not present

## 2020-08-07 DIAGNOSIS — E119 Type 2 diabetes mellitus without complications: Secondary | ICD-10-CM | POA: Diagnosis not present

## 2020-11-04 DIAGNOSIS — E039 Hypothyroidism, unspecified: Secondary | ICD-10-CM | POA: Diagnosis not present

## 2020-11-04 DIAGNOSIS — Z79899 Other long term (current) drug therapy: Secondary | ICD-10-CM | POA: Diagnosis not present

## 2020-11-04 DIAGNOSIS — I10 Essential (primary) hypertension: Secondary | ICD-10-CM | POA: Diagnosis not present

## 2020-11-11 DIAGNOSIS — N1832 Chronic kidney disease, stage 3b: Secondary | ICD-10-CM | POA: Diagnosis not present

## 2020-11-11 DIAGNOSIS — E039 Hypothyroidism, unspecified: Secondary | ICD-10-CM | POA: Diagnosis not present

## 2020-11-11 DIAGNOSIS — Z0000 Encounter for general adult medical examination without abnormal findings: Secondary | ICD-10-CM | POA: Diagnosis not present

## 2020-11-11 DIAGNOSIS — E782 Mixed hyperlipidemia: Secondary | ICD-10-CM | POA: Diagnosis not present

## 2020-11-11 DIAGNOSIS — I10 Essential (primary) hypertension: Secondary | ICD-10-CM | POA: Diagnosis not present

## 2020-11-11 DIAGNOSIS — Z79899 Other long term (current) drug therapy: Secondary | ICD-10-CM | POA: Diagnosis not present

## 2020-11-11 DIAGNOSIS — E118 Type 2 diabetes mellitus with unspecified complications: Secondary | ICD-10-CM | POA: Diagnosis not present

## 2021-02-20 DIAGNOSIS — I10 Essential (primary) hypertension: Secondary | ICD-10-CM | POA: Diagnosis not present

## 2021-02-20 DIAGNOSIS — E118 Type 2 diabetes mellitus with unspecified complications: Secondary | ICD-10-CM | POA: Diagnosis not present

## 2021-02-20 DIAGNOSIS — E039 Hypothyroidism, unspecified: Secondary | ICD-10-CM | POA: Diagnosis not present

## 2021-02-20 DIAGNOSIS — Z79899 Other long term (current) drug therapy: Secondary | ICD-10-CM | POA: Diagnosis not present

## 2021-02-20 DIAGNOSIS — E782 Mixed hyperlipidemia: Secondary | ICD-10-CM | POA: Diagnosis not present

## 2021-02-26 DIAGNOSIS — E039 Hypothyroidism, unspecified: Secondary | ICD-10-CM | POA: Diagnosis not present

## 2021-02-26 DIAGNOSIS — I10 Essential (primary) hypertension: Secondary | ICD-10-CM | POA: Diagnosis not present

## 2021-02-26 DIAGNOSIS — E782 Mixed hyperlipidemia: Secondary | ICD-10-CM | POA: Diagnosis not present

## 2021-02-26 DIAGNOSIS — N1832 Chronic kidney disease, stage 3b: Secondary | ICD-10-CM | POA: Diagnosis not present

## 2021-02-26 DIAGNOSIS — E118 Type 2 diabetes mellitus with unspecified complications: Secondary | ICD-10-CM | POA: Diagnosis not present

## 2021-05-29 DIAGNOSIS — I10 Essential (primary) hypertension: Secondary | ICD-10-CM | POA: Diagnosis not present

## 2021-05-29 DIAGNOSIS — Z79899 Other long term (current) drug therapy: Secondary | ICD-10-CM | POA: Diagnosis not present

## 2021-05-29 DIAGNOSIS — E039 Hypothyroidism, unspecified: Secondary | ICD-10-CM | POA: Diagnosis not present

## 2021-06-05 DIAGNOSIS — E118 Type 2 diabetes mellitus with unspecified complications: Secondary | ICD-10-CM | POA: Diagnosis not present

## 2021-06-05 DIAGNOSIS — I10 Essential (primary) hypertension: Secondary | ICD-10-CM | POA: Diagnosis not present

## 2021-06-05 DIAGNOSIS — E782 Mixed hyperlipidemia: Secondary | ICD-10-CM | POA: Diagnosis not present

## 2021-06-05 DIAGNOSIS — N183 Chronic kidney disease, stage 3 unspecified: Secondary | ICD-10-CM | POA: Diagnosis not present

## 2021-06-05 DIAGNOSIS — Z79899 Other long term (current) drug therapy: Secondary | ICD-10-CM | POA: Diagnosis not present

## 2021-06-05 DIAGNOSIS — E039 Hypothyroidism, unspecified: Secondary | ICD-10-CM | POA: Diagnosis not present

## 2021-06-05 DIAGNOSIS — M810 Age-related osteoporosis without current pathological fracture: Secondary | ICD-10-CM | POA: Diagnosis not present

## 2021-08-20 ENCOUNTER — Other Ambulatory Visit: Payer: Self-pay

## 2021-08-20 ENCOUNTER — Ambulatory Visit: Payer: Medicare PPO | Admitting: Dermatology

## 2021-08-20 DIAGNOSIS — D229 Melanocytic nevi, unspecified: Secondary | ICD-10-CM | POA: Diagnosis not present

## 2021-08-20 DIAGNOSIS — Z1283 Encounter for screening for malignant neoplasm of skin: Secondary | ICD-10-CM

## 2021-08-20 DIAGNOSIS — L821 Other seborrheic keratosis: Secondary | ICD-10-CM

## 2021-08-20 DIAGNOSIS — Z86007 Personal history of in-situ neoplasm of skin: Secondary | ICD-10-CM | POA: Diagnosis not present

## 2021-08-20 DIAGNOSIS — L578 Other skin changes due to chronic exposure to nonionizing radiation: Secondary | ICD-10-CM | POA: Diagnosis not present

## 2021-08-20 DIAGNOSIS — L820 Inflamed seborrheic keratosis: Secondary | ICD-10-CM

## 2021-08-20 DIAGNOSIS — L570 Actinic keratosis: Secondary | ICD-10-CM | POA: Diagnosis not present

## 2021-08-20 DIAGNOSIS — L814 Other melanin hyperpigmentation: Secondary | ICD-10-CM

## 2021-08-20 DIAGNOSIS — D1800 Hemangioma unspecified site: Secondary | ICD-10-CM

## 2021-08-20 NOTE — Progress Notes (Signed)
Follow-Up Visit   Subjective  Erica Lane is a 88 y.o. female who presents for the following: Total body skin exam (Hx of SCC, AKs) and check spots (L ear, face, has had for ~34yr). The patient presents for Total-Body Skin Exam (TBSE) for skin cancer screening and mole check.  The patient has spots, moles and lesions to be evaluated, some may be new or changing and the patient has concerns that these could be cancer.   The following portions of the chart were reviewed this encounter and updated as appropriate:   Tobacco   Allergies   Meds   Problems   Med Hx   Surg Hx   Fam Hx      Review of Systems:  No other skin or systemic complaints except as noted in HPI or Assessment and Plan.  Objective  Well appearing patient in no apparent distress; mood and affect are within normal limits.  A full examination was performed including scalp, head, eyes, ears, nose, lips, neck, chest, axillae, abdomen, back, buttocks, bilateral upper extremities, bilateral lower extremities, hands, feet, fingers, toes, fingernails, and toenails. All findings within normal limits unless otherwise noted below.  L proximal pretibial Well healed scar with no evidence of recurrence, no lymphadenopathy.   face x 5, L ear x 1 (6) Pink scaly macules   L preauricular x 1 Stuck on waxy paps with erythema    Assessment & Plan   Lentigines - Scattered tan macules - Due to sun exposure - Benign-appearing, observe - Recommend daily broad spectrum sunscreen SPF 30+ to sun-exposed areas, reapply every 2 hours as needed. - Call for any changes  Seborrheic Keratoses - Stuck-on, waxy, tan-brown papules and/or plaques  - Benign-appearing - Discussed benign etiology and prognosis. - Observe - Call for any changes  Melanocytic Nevi - Tan-brown and/or pink-flesh-colored symmetric macules and papules - Benign appearing on exam today - Observation - Call clinic for new or changing moles - Recommend daily use of  broad spectrum spf 30+ sunscreen to sun-exposed areas.   Hemangiomas - Red papules - Discussed benign nature - Observe - Call for any changes  Actinic Damage - Chronic condition, secondary to cumulative UV/sun exposure - diffuse scaly erythematous macules with underlying dyspigmentation - Recommend daily broad spectrum sunscreen SPF 30+ to sun-exposed areas, reapply every 2 hours as needed.  - Staying in the shade or wearing long sleeves, sun glasses (UVA+UVB protection) and wide brim hats (4-inch brim around the entire circumference of the hat) are also recommended for sun protection.  - Call for new or changing lesions.  Skin cancer screening performed today.  History of squamous cell carcinoma in situ (SCCIS) of skin L proximal pretibial  Clear. Observe for recurrence. Call clinic for new or changing lesions.  Recommend regular skin exams, daily broad-spectrum spf 30+ sunscreen use, and photoprotection.    AK (actinic keratosis) (6) face x 5, L ear x 1  Destruction of lesion - face x 5, L ear x 1 Complexity: simple   Destruction method: cryotherapy   Informed consent: discussed and consent obtained   Timeout:  patient name, date of birth, surgical site, and procedure verified Lesion destroyed using liquid nitrogen: Yes   Region frozen until ice ball extended beyond lesion: Yes   Outcome: patient tolerated procedure well with no complications   Post-procedure details: wound care instructions given    Inflamed seborrheic keratosis L preauricular x 1  Destruction of lesion - L preauricular x 1 Complexity:  simple   Destruction method: cryotherapy   Informed consent: discussed and consent obtained   Timeout:  patient name, date of birth, surgical site, and procedure verified Lesion destroyed using liquid nitrogen: Yes   Region frozen until ice ball extended beyond lesion: Yes   Outcome: patient tolerated procedure well with no complications   Post-procedure details: wound  care instructions given    Skin cancer screening   Return in about 1 year (around 08/20/2022) for Hx of SCC, Hx of AKs.  I, Othelia Pulling, RMA, am acting as scribe for Sarina Ser, MD . Documentation: I have reviewed the above documentation for accuracy and completeness, and I agree with the above.  Sarina Ser, MD

## 2021-08-20 NOTE — Patient Instructions (Signed)

## 2021-08-21 ENCOUNTER — Encounter: Payer: Self-pay | Admitting: Dermatology

## 2022-03-16 ENCOUNTER — Other Ambulatory Visit (INDEPENDENT_AMBULATORY_CARE_PROVIDER_SITE_OTHER): Payer: Self-pay | Admitting: Nurse Practitioner

## 2022-03-16 DIAGNOSIS — M79674 Pain in right toe(s): Secondary | ICD-10-CM

## 2022-03-16 DIAGNOSIS — L819 Disorder of pigmentation, unspecified: Secondary | ICD-10-CM

## 2022-03-17 ENCOUNTER — Encounter (INDEPENDENT_AMBULATORY_CARE_PROVIDER_SITE_OTHER): Payer: Self-pay | Admitting: Vascular Surgery

## 2022-03-17 ENCOUNTER — Ambulatory Visit (INDEPENDENT_AMBULATORY_CARE_PROVIDER_SITE_OTHER): Payer: Medicare PPO

## 2022-03-17 ENCOUNTER — Ambulatory Visit (INDEPENDENT_AMBULATORY_CARE_PROVIDER_SITE_OTHER): Payer: Medicare PPO | Admitting: Vascular Surgery

## 2022-03-17 VITALS — BP 148/67 | HR 62 | Resp 16 | Wt 137.4 lb

## 2022-03-17 DIAGNOSIS — E119 Type 2 diabetes mellitus without complications: Secondary | ICD-10-CM

## 2022-03-17 DIAGNOSIS — I10 Essential (primary) hypertension: Secondary | ICD-10-CM | POA: Diagnosis not present

## 2022-03-17 DIAGNOSIS — I70221 Atherosclerosis of native arteries of extremities with rest pain, right leg: Secondary | ICD-10-CM

## 2022-03-17 DIAGNOSIS — L819 Disorder of pigmentation, unspecified: Secondary | ICD-10-CM

## 2022-03-17 DIAGNOSIS — M79674 Pain in right toe(s): Secondary | ICD-10-CM | POA: Diagnosis not present

## 2022-03-17 DIAGNOSIS — E782 Mixed hyperlipidemia: Secondary | ICD-10-CM

## 2022-03-17 DIAGNOSIS — I70229 Atherosclerosis of native arteries of extremities with rest pain, unspecified extremity: Secondary | ICD-10-CM | POA: Insufficient documentation

## 2022-03-17 DIAGNOSIS — I739 Peripheral vascular disease, unspecified: Secondary | ICD-10-CM | POA: Insufficient documentation

## 2022-03-17 NOTE — Assessment & Plan Note (Signed)
ABIs today are 0.54 on the right with digit pressure of only 42.  On the left, Erica Lane ABI 0.69 with a digit pressure of 89.   She clearly has very severe claudication symptoms, but now describes symptoms that sound like rest pain on the right foot.  We discussed that this would now be a critical and limb threatening situation and discussed that generally the neck step would be an angiogram with potential revascularization.  We discussed the reason and rationale for intervention.  We discussed the pathophysiology and natural history of peripheral arterial disease.  Given Erica Lane advanced age, she is hesitant to have a procedure and was to go home and think about this which is very reasonable.  At a minimum, we will have a short interval follow-up about 3 months and asked Erica Lane to increase Erica Lane activity.  She is already on aspirin and a statin agent.  She may call our office to schedule this in the near future but otherwise we will see Erica Lane in 3 months

## 2022-03-17 NOTE — Patient Instructions (Signed)
Endovascular Therapy for Peripheral Vascular Disease Endovascular therapy is a procedure to widen a narrowed blood vessel and improve blood flow. It is used to treat peripheral vascular disease (PVD). PVD may also be called peripheral artery disease (PAD) or poor circulation.  Endovascular means the procedure is done inside your artery, using a long, thin tube (catheter). The catheter is inserted into an incision in your leg and moved up your artery until it reaches the narrow part. A balloon or a small metal tube (stent) may be used to help widen the narrow artery and keep it open. Your health care provider may recommend endovascular therapy if lifestyle changes and medicines have not improved your PVD. In some cases, such as when more than one artery is affected, you may need more than one procedure. Tell a health care provider about: Any allergies you have. All medicines you are taking, including vitamins, herbs, eye drops, creams, and over-the-counter medicines. Any problems you or family members have had with anesthetic medicines. Any blood disorders you have. Any surgeries you have had. Any medical conditions you have. Whether you are pregnant or may be pregnant. What are the risks? Generally, this is a safe procedure. However, problems may occur, including: Infection. Bleeding. Allergic reactions to medicines, materials, or dyes. Damage to other structures or organs. This may include nerve damage or kidney problems. Blood clots, heart attack, or stroke. The stent moving out of place, becoming blocked, or not working. What happens before the procedure? Medicines Ask your health care provider about: Changing or stopping your regular medicines. This is especially important if you are taking diabetes medicines or blood thinners. Taking medicines such as aspirin and ibuprofen. These medicines can thin your blood. Do not take these medicines unless your health care provider tells you to take  them. Taking over-the-counter medicines, vitamins, herbs, and supplements. Tests You will have blood tests and a physical exam. You may have other tests, such as: Ankle-brachial index (ABI). This test compares blood pressure in your ankle and arm. This can indicate narrowing or blockage in your leg arteries. Doppler ultrasound. This test uses sound waves to check blood flow. CT scan. This test uses dye to check blood flow and blockages in your leg arteries. MRI. Electrocardiogram (ECG). This test checks the electrical patterns and rhythms of the heart. Surgery safety Ask your health care provider: How your surgery site will be marked. What steps will be taken to help prevent infection. These steps may include: Removing hair at the surgery site. Washing skin with a germ-killing soap. Taking antibiotic medicine. General instructions Do not use any products that contain nicotine or tobacco for at least 4 weeks before the procedure. These products include cigarettes, chewing tobacco, and vaping devices, such as e-cigarettes. If you need help quitting, ask your health care provider. Follow instructions from your health care provider about eating or drinking restrictions. Plan to have a responsible adult take you home from the hospital or clinic. Plan to have a responsible adult care for you for the time you are told after you leave the hospital or clinic. This is important. What happens during the procedure?  An IV will be inserted into one of your veins. You will be given one or more of the following: A medicine to help you relax (sedative). A medicine to numb the area (local anesthetic). A puncture or small incision will be made in your upper thigh area, in the femoral artery or the iliac artery. Rarely, a puncture or incision  may be made in the ankle area. A catheter will be inserted into the artery. It will be moved up the artery to reach the blocked or narrow part using a type of X-ray  (fluoroscopy). When the catheter is near the blocked or narrow part of the artery, contrast dye will be injected that makes the narrowing or blockage visible on the X-ray. Another catheter with a small, deflated balloon will be inserted into the artery. It will be moved up the artery to reach the blocked or narrow part. The small balloon will be inflated to widen the narrow part of the artery. The balloon will be deflated. A stent may be placed in the widened part of the artery to keep the artery open. The catheters will be removed. Your puncture or incision may be closed with a stitch (suture) or skin glue. Your puncture or incision may be covered with a bandage (dressing). The procedure may vary among health care providers and hospitals. What happens after the procedure? Your blood pressure, heart rate, breathing rate, and blood oxygen level will be monitored until you leave the hospital or clinic. You will need to stay in bed as directed. You will be encouraged to drink fluids to flush the dye out of your body. You will be given pain medicine as needed. If you were given a sedative during the procedure, it can affect you for several hours. Do not drive or operate machinery until your health care provider says that it is safe. Summary Endovascular therapy is a procedure to widen a narrowed blood vessel and improve blood flow. This procedure may be recommended if lifestyle changes and medicines are not enough to improve your peripheral vascular disease (PVD). After the procedure, you will need to stay in bed and you will be encouraged to drink fluids to flush the dye out of your body. This information is not intended to replace advice given to you by your health care provider. Make sure you discuss any questions you have with your health care provider. Document Revised: 08/28/2021 Document Reviewed: 02/05/2020 [employer] Patient Education  [employer].

## 2022-03-17 NOTE — Assessment & Plan Note (Signed)
blood pressure control important in reducing the progression of atherosclerotic disease. On appropriate oral medications.  

## 2022-03-17 NOTE — Assessment & Plan Note (Signed)
blood glucose control important in reducing the progression of atherosclerotic disease. Also, involved in wound healing. On appropriate medications.  

## 2022-03-17 NOTE — Assessment & Plan Note (Signed)
lipid control important in reducing the progression of atherosclerotic disease. Continue statin therapy  

## 2022-03-17 NOTE — Progress Notes (Signed)
Patient ID: Erica Lane, female   DOB: [birth year], 88 y.o.   MRN: [id number]  Chief Complaint  Patient presents with   New Patient (Initial Visit)    Ref Cleda Mccreedy consult peripheral artery vasospasms,pain,discoloration of great right toe    HPI Erica Lane is a 88 y.o. female.  I am asked to see the patient by Dr. Cleda Mccreedy for evaluation of pain in her right foot particular her right great toe.  This started a couple of months ago.  She has had longstanding neuropathic symptoms with numbness in both lower legs.  The numbness has been present for about a decade.  This pain in her foot is a newer problem.  For many months, she has described pain and cramping in her legs below the knees with short distances of walking.  She thinks she can probably walk less than 50 feet at this point.  She also has problems with stability and when she goes to a store she generally uses the cart for support.  No current open wounds or infection.  Her right great toe is the most severely affected in terms of pain but sometimes all the toes on her right foot hurt.  The left leg, she has the cramping in her lower legs with walking but does not really have pain at rest at this point.  ABIs today are 0.54 on the right with digit pressure of only 42.  On the left, her ABI 0.69 with a digit pressure of 89.     Past Medical History:  Diagnosis Date   Actinic keratosis    Cancer ([place])    skin   Heart murmur    Heartburn    HLD (hyperlipidemia)    HTN (hypertension)    Hyperthyroidism    Squamous cell carcinoma of skin 07/25/2020   L prox pretibial    Past Surgical History:  Procedure Laterality Date   broken foot     EXCISIONAL HEMORRHOIDECTOMY     GALLBLADDER SURGERY     MEDIAL PARTIAL KNEE REPLACEMENT     TOTAL HIP ARTHROPLASTY Right    from crushed hip   VAGINAL HYSTERECTOMY  1961     Family History  Problem Relation Age of Onset   Cancer Sister 41       stomach   Prostate cancer Brother     Prostate cancer Brother    Kidney cancer Neg Hx    Bladder Cancer Neg Hx    Breast cancer Neg Hx       Social History   Tobacco Use   Smoking status: Never   Smokeless tobacco: Never  Vaping Use   Vaping Use: Never used  Substance Use Topics   Alcohol use: Yes    Comment: occ   Drug use: No     No Known Allergies  Current Outpatient Medications  Medication Sig Dispense Refill   amLODipine (NORVASC) 5 MG tablet      aspirin EC 81 MG tablet Take by mouth.     calcium-vitamin D (SM CALCIUM 500/VITAMIN D3) 500-400 MG-UNIT tablet Take by mouth.     carvedilol (COREG) 6.25 MG tablet Take 6.25 mg by mouth 2 (two) times daily with a meal.      cloNIDine (CATAPRES) 0.1 MG tablet 1 tablet each AM, 2 tablets each afternoon and 1 tablet at bedtime     DIGESTIVE ENZYMES PO Take by mouth.     levothyroxine (SYNTHROID, LEVOTHROID) 100 MCG tablet TAKE 1 TABLET (100  MCG TOTAL) BY MOUTH ONCE DAILY. TAKE ON AN EMPTY STOMACH WITH A GLASS OF WATER AT LEAST 30-60 MINUTES BEFORE BREAKFAST.     pantoprazole (PROTONIX) 40 MG tablet Take 40 mg by mouth 2 (two) times daily.      rosuvastatin (CRESTOR) 5 MG tablet Take 5 mg by mouth daily.     sucralfate (CARAFATE) 1 g tablet Take by mouth 2 (two) times daily.      telmisartan (MICARDIS) 80 MG tablet Take 80 mg by mouth daily.      Trospium Chloride 60 MG CP24 Take 1 capsule (60 mg total) by mouth daily. 30 capsule 0   Vitamin D, Ergocalciferol, (DRISDOL) 50000 units CAPS capsule TAKE 1 CAPSULE BY MOUTH ONCE A WEEK     fesoterodine (TOVIAZ) 8 MG TB24 tablet Take 1 tablet (8 mg total) by mouth daily. (Patient not taking: Reported on 07/23/2018) 90 tablet 3   lovastatin (MEVACOR) 20 MG tablet Take 20 mg at bedtime by mouth. (Patient not taking: Reported on 03/17/2022)     ranitidine (ZANTAC) 300 MG tablet TAKE ONE TABLET BY MOUTH EVERY NIGHT (Patient not taking: Reported on 03/17/2022)     No current facility-administered medications for this visit.       REVIEW OF SYSTEMS (Negative unless checked)  Constitutional: '[]'$ Weight loss  '[]'$ Fever  '[]'$ Chills Cardiac: '[]'$ Chest pain   '[]'$ Chest pressure   '[]'$ Palpitations   '[]'$ Shortness of breath when laying flat   '[]'$ Shortness of breath at rest   '[]'$ Shortness of breath with exertion. Vascular:  '[]'$ Pain in legs with walking   '[]'$ Pain in legs at rest   '[]'$ Pain in legs when laying flat   '[]'$ Claudication   '[]'$ Pain in feet when walking  '[x]'$ Pain in feet at rest  '[]'$ Pain in feet when laying flat   '[]'$ History of DVT   '[]'$ Phlebitis   '[]'$ Swelling in legs   '[]'$ Varicose veins   '[]'$ Non-healing ulcers Pulmonary:   '[]'$ Uses home oxygen   '[]'$ Productive cough   '[]'$ Hemoptysis   '[]'$ Wheeze  '[]'$ COPD   '[]'$ Asthma Neurologic:  '[]'$ Dizziness  '[]'$ Blackouts   '[]'$ Seizures   '[]'$ History of stroke   '[]'$ History of TIA  '[]'$ Aphasia   '[]'$ Temporary blindness   '[]'$ Dysphagia   '[]'$ Weakness or numbness in arms   '[x]'$ Weakness or numbness in legs Musculoskeletal:  '[x]'$ Arthritis   '[]'$ Joint swelling   '[]'$ Joint pain   '[]'$ Low back pain Hematologic:  '[]'$ Easy bruising  '[]'$ Easy bleeding   '[]'$ Hypercoagulable state   '[]'$ Anemic  '[]'$ Hepatitis Gastrointestinal:  '[]'$ Blood in stool   '[]'$ Vomiting blood  '[]'$ Gastroesophageal reflux/heartburn   '[]'$ Abdominal pain Genitourinary:  '[]'$ Chronic kidney disease   '[]'$ Difficult urination  '[]'$ Frequent urination  '[]'$ Burning with urination   '[]'$ Hematuria Skin:  '[]'$ Rashes   '[]'$ Ulcers   '[]'$ Wounds Psychological:  '[]'$ History of anxiety   '[]'$  History of major depression.    Physical Exam BP (!) 148/67 (BP Location: Right Arm)   Pulse 62   Resp 16   Wt 137 lb 6.4 oz (62.3 kg)   BMI 24.34 kg/m  Gen:  WD/WN, NAD. Appears younger than stated age. Head: [place]/AT, No temporalis wasting.  Ear/Nose/Throat: Hearing grossly intact, nares w/o erythema or drainage, oropharynx w/o Erythema/Exudate Eyes: Conjunctiva clear, sclera non-icteric  Neck: trachea midline.  No JVD.  Pulmonary:  Good air movement, respirations not labored, no use of accessory muscles  Cardiac: RRR, no JVD Vascular:   Vessel Right Left  Radial Palpable Palpable  DP 1+ 1+  PT Trace  1+   Gastrointestinal:. No masses, surgical incisions, or scars. Musculoskeletal: M/S 5/5 throughout.  Right great toe has some purplish discoloration particularly with dependency.  No open wounds.  No deformity or atrophy.  Diffuse varicosities present bilaterally.  No significant lower extremity edema. Neurologic: Sensation grossly intact in extremities.  Symmetrical.  Speech is fluent. Motor exam as listed above. Psychiatric: Judgment intact, Mood & affect appropriate for pt's clinical situation. Dermatologic: No rashes or ulcers noted.  No cellulitis or open wounds.    Radiology No results found.  Labs No results found for this or any previous visit (from the past 2160 hour(s)).  Assessment/Plan:  Atherosclerosis of native arteries of extremity with rest pain (HCC) ABIs today are 0.54 on the right with digit pressure of only 42.  On the left, her ABI 0.69 with a digit pressure of 89.   She clearly has very severe claudication symptoms, but now describes symptoms that sound like rest pain on the right foot.  We discussed that this would now be a critical and limb threatening situation and discussed that generally the neck step would be an angiogram with potential revascularization.  We discussed the reason and rationale for intervention.  We discussed the pathophysiology and natural history of peripheral arterial disease.  Given her advanced age, she is hesitant to have a procedure and was to go home and think about this which is very reasonable.  At a minimum, we will have a short interval follow-up about 3 months and asked her to increase her activity.  She is already on aspirin and a statin agent.  She may call our office to schedule this in the near future but otherwise we will see her in 3 months  Essential hypertension blood pressure control important in reducing the progression of  atherosclerotic disease. On appropriate oral medications.   Diabetes mellitus type 2, uncomplicated (HCC) blood glucose control important in reducing the progression of atherosclerotic disease. Also, involved in wound healing. On appropriate medications.   Mixed hyperlipidemia lipid control important in reducing the progression of atherosclerotic disease. Continue statin therapy      Leotis Pain 03/17/2022, 9:57 AM   This note was created with Dragon medical transcription system.  Any errors from dictation are unintentional.

## 2022-05-08 ENCOUNTER — Encounter (INDEPENDENT_AMBULATORY_CARE_PROVIDER_SITE_OTHER): Payer: Self-pay

## 2022-05-08 ENCOUNTER — Encounter (INDEPENDENT_AMBULATORY_CARE_PROVIDER_SITE_OTHER): Payer: Self-pay | Admitting: Vascular Surgery

## 2022-06-16 ENCOUNTER — Ambulatory Visit (INDEPENDENT_AMBULATORY_CARE_PROVIDER_SITE_OTHER): Payer: Medicare PPO | Admitting: Vascular Surgery

## 2022-06-16 ENCOUNTER — Encounter (INDEPENDENT_AMBULATORY_CARE_PROVIDER_SITE_OTHER): Payer: Medicare PPO

## 2022-08-31 ENCOUNTER — Encounter: Payer: Self-pay | Admitting: Dermatology

## 2022-08-31 ENCOUNTER — Ambulatory Visit: Payer: Medicare PPO | Admitting: Dermatology

## 2022-08-31 VITALS — BP 139/74 | HR 65

## 2022-08-31 DIAGNOSIS — L578 Other skin changes due to chronic exposure to nonionizing radiation: Secondary | ICD-10-CM

## 2022-08-31 DIAGNOSIS — Z1283 Encounter for screening for malignant neoplasm of skin: Secondary | ICD-10-CM

## 2022-08-31 DIAGNOSIS — L821 Other seborrheic keratosis: Secondary | ICD-10-CM

## 2022-08-31 DIAGNOSIS — Z8589 Personal history of malignant neoplasm of other organs and systems: Secondary | ICD-10-CM

## 2022-08-31 DIAGNOSIS — L820 Inflamed seborrheic keratosis: Secondary | ICD-10-CM

## 2022-08-31 DIAGNOSIS — L814 Other melanin hyperpigmentation: Secondary | ICD-10-CM

## 2022-08-31 DIAGNOSIS — D229 Melanocytic nevi, unspecified: Secondary | ICD-10-CM

## 2022-08-31 DIAGNOSIS — Z85828 Personal history of other malignant neoplasm of skin: Secondary | ICD-10-CM

## 2022-08-31 NOTE — Progress Notes (Signed)
Follow-Up Visit   Subjective  Erica Lane is a 89 y.o. female who presents for the following: Annual Exam (Hx of actinic keratoses. HxSCC. Areas of concern on face). The patient presents for Total-Body Skin Exam (TBSE) for skin cancer screening and mole check.  The patient has spots, moles and lesions to be evaluated, some may be new or changing and the patient has concerns that these could be cancer.  The following portions of the chart were reviewed this encounter and updated as appropriate:  Tobacco  Allergies  Meds  Problems  Med Hx  Surg Hx  Fam Hx     Review of Systems: No other skin or systemic complaints except as noted in HPI or Assessment and Plan.  Objective  Well appearing patient in no apparent distress; mood and affect are within normal limits.  A full examination was performed including scalp, head, eyes, ears, nose, lips, neck, chest, axillae, abdomen, back, buttocks, bilateral upper extremities, bilateral lower extremities, hands, feet, fingers, toes, fingernails, and toenails. All findings within normal limits unless otherwise noted below.  Left Thigh - Posterior x3, left lateral thigh x1, right posterior thigh x1 (5) Erythematous keratotic or waxy stuck-on papule or plaque.   Assessment & Plan   History of Squamous Cell Carcinoma of the Skin. Left proximal pretibial. 07/2020 - No evidence of recurrence today - No lymphadenopathy - Recommend regular full body skin exams - Recommend daily broad spectrum sunscreen SPF 30+ to sun-exposed areas, reapply every 2 hours as needed.  - Call if any new or changing lesions are noted between office visits  Lentigines - Scattered tan macules - Due to sun exposure - Benign-appearing, observe - Recommend daily broad spectrum sunscreen SPF 30+ to sun-exposed areas, reapply every 2 hours as needed. - Call for any changes  Seborrheic Keratoses - Stuck-on, waxy, tan-brown papules and/or plaques  -  Benign-appearing - Discussed benign etiology and prognosis. - Observe - Call for any changes  Melanocytic Nevi - Tan-brown and/or pink-flesh-colored symmetric macules and papules - Benign appearing on exam today - Observation - Call clinic for new or changing moles - Recommend daily use of broad spectrum spf 30+ sunscreen to sun-exposed areas.   Hemangiomas - Red papules - Discussed benign nature - Observe - Call for any changes  Actinic Damage - Chronic condition, secondary to cumulative UV/sun exposure - diffuse scaly erythematous macules with underlying dyspigmentation - Recommend daily broad spectrum sunscreen SPF 30+ to sun-exposed areas, reapply every 2 hours as needed.  - Staying in the shade or wearing long sleeves, sun glasses (UVA+UVB protection) and wide brim hats (4-inch brim around the entire circumference of the hat) are also recommended for sun protection.  - Call for new or changing lesions.  Skin cancer screening performed today.  Inflamed seborrheic keratosis (5) Left Thigh - Posterior x3, left lateral thigh x1, right posterior thigh x1 Symptomatic, irritating, patient would like treated. Destruction of lesion - Left Thigh - Posterior x3, left lateral thigh x1, right posterior thigh x1 Complexity: simple   Destruction method: cryotherapy   Informed consent: discussed and consent obtained   Timeout:  patient name, date of birth, surgical site, and procedure verified Lesion destroyed using liquid nitrogen: Yes   Region frozen until ice ball extended beyond lesion: Yes   Outcome: patient tolerated procedure well with no complications   Post-procedure details: wound care instructions given   Additional details:  Prior to procedure, discussed risks of blister formation, small wound, skin dyspigmentation,  or rare scar following cryotherapy. Recommend Vaseline ointment to treated areas while healing.   Return in about 1 year (around 09/01/2023) for TBSE, HxSCC, Hx  AKs. Documentation: I have reviewed the above documentation for accuracy and completeness, and I agree with the above.  Sarina Ser, MD

## 2022-08-31 NOTE — Patient Instructions (Addendum)
Cryotherapy Aftercare  Wash gently with soap and water everyday.   Apply Vaseline daily until healed.    Recommend daily broad spectrum sunscreen SPF 30+ to sun-exposed areas, reapply every 2 hours as needed. Call for new or changing lesions.  Staying in the shade or wearing long sleeves, sun glasses (UVA+UVB protection) and wide brim hats (4-inch brim around the entire circumference of the hat) are also recommended for sun protection.    Seborrheic Keratosis  What causes seborrheic keratoses? Seborrheic keratoses are harmless, common skin growths that first appear during adult life.  As time goes by, more growths appear.  Some people may develop a large number of them.  Seborrheic keratoses appear on both covered and uncovered body parts.  They are not caused by sunlight.  The tendency to develop seborrheic keratoses can be inherited.  They vary in color from skin-colored to gray, brown, or even black.  They can be either smooth or have a rough, warty surface.   Seborrheic keratoses are superficial and look as if they were stuck on the skin.  Under the microscope this type of keratosis looks like layers upon layers of skin.  That is why at times the top layer may seem to fall off, but the rest of the growth remains and re-grows.    Treatment Seborrheic keratoses do not need to be treated, but can easily be removed in the office.  Seborrheic keratoses often cause symptoms when they rub on clothing or jewelry.  Lesions can be in the way of shaving.  If they become inflamed, they can cause itching, soreness, or burning.  Removal of a seborrheic keratosis can be accomplished by freezing, burning, or surgery. If any spot bleeds, scabs, or grows rapidly, please return to have it checked, as these can be an indication of a skin cancer.    Melanoma ABCDEs  Melanoma is the most dangerous type of skin cancer, and is the leading cause of death from skin disease.  You are more likely to develop melanoma  if you: Have light-colored skin, light-colored eyes, or red or blond hair Spend a lot of time in the sun Tan regularly, either outdoors or in a tanning bed Have had blistering sunburns, especially during childhood Have a close family member who has had a melanoma Have atypical moles or large birthmarks  Early detection of melanoma is key since treatment is typically straightforward and cure rates are extremely high if we catch it early.   The first sign of melanoma is often a change in a mole or a new dark spot.  The ABCDE system is a way of remembering the signs of melanoma.  A for asymmetry:  The two halves do not match. B for border:  The edges of the growth are irregular. C for color:  A mixture of colors are present instead of an even brown color. D for diameter:  Melanomas are usually (but not always) greater than 38m - the size of a pencil eraser. E for evolution:  The spot keeps changing in size, shape, and color.  Please check your skin once per month between visits. You can use a small mirror in front and a large mirror behind you to keep an eye on the back side or your body.   If you see any new or changing lesions before your next follow-up, please call to schedule a visit.  Please continue daily skin protection including broad spectrum sunscreen SPF 30+ to sun-exposed areas, reapplying every 2 hours  as needed when you're outdoors.   Staying in the shade or wearing long sleeves, sun glasses (UVA+UVB protection) and wide brim hats (4-inch brim around the entire circumference of the hat) are also recommended for sun protection.    Due to recent changes in healthcare laws, you may see results of your pathology and/or laboratory studies on MyChart before the doctors have had a chance to review them. We understand that in some cases there may be results that are confusing or concerning to you. Please understand that not all results are received at the same time and often the doctors  may need to interpret multiple results in order to provide you with the best plan of care or course of treatment. Therefore, we ask that you please give [place] 2 business days to thoroughly review all your results before contacting the office for clarification. Should we see a critical lab result, you will be contacted sooner.   If You Need Anything After Your Visit  If you have any questions or concerns for your doctor, please call our main line at [phone] and press option 4 to reach your doctor's medical assistant. If no one answers, please leave a voicemail as directed and we will return your call as soon as possible. Messages left after 4 pm will be answered the following business day.   You may also send [place] a message via [place]. We typically respond to MyChart messages within 1-2 business days.  For prescription refills, please ask your pharmacy to contact our office. Our fax number is [phone].  If you have an urgent issue when the clinic is closed that cannot wait until the next business day, you can page your doctor at the number below.    Please note that while we do our best to be available for urgent issues outside of office hours, we are not available 24/7.   If you have an urgent issue and are unable to reach [place], you may choose to seek medical care at your doctor's office, retail clinic, urgent care center, or emergency room.  If you have a medical emergency, please immediately call 911 or go to the emergency department.  Pager Numbers  - Dr. Nehemiah Massed: [phone]  - Dr. Laurence Ferrari: [phone]  - Dr. Nicole Kindred: [phone]  In the event of inclement weather, please call our main line at [phone] for an update on the status of any delays or closures.  Dermatology Medication Tips: Please keep the boxes that topical medications come in in order to help keep track of the instructions about where and how to use these. Pharmacies typically print the medication instructions  only on the boxes and not directly on the medication tubes.   If your medication is too expensive, please contact our office at [phone] option 4 or send [place] a message through [place].   We are unable to tell what your co-pay for medications will be in advance as this is different depending on your insurance coverage. However, we may be able to find a substitute medication at lower cost or fill out paperwork to get insurance to cover a needed medication.   If a prior authorization is required to get your medication covered by your insurance company, please allow [place] 1-2 business days to complete this process.  Drug prices often vary depending on where the prescription is filled and some pharmacies may offer cheaper prices.  The website www.goodrx.com contains coupons for medications through different pharmacies. The prices here do not account for what  the cost may be with help from insurance (it may be cheaper with your insurance), but the website can give you the price if you did not use any insurance.  - You can print the associated coupon and take it with your prescription to the pharmacy.  - You may also stop by our office during regular business hours and pick up a GoodRx coupon card.  - If you need your prescription sent electronically to a different pharmacy, notify our office through [facility] or by phone at [phone] option 4.     Si Usted Necesita Algo Despus de Su Visita  Tambin puede enviarnos un mensaje a travs de Pharmacist, community. Por lo general respondemos a los mensajes de MyChart en el transcurso de 1 a 2 das hbiles.  Para renovar recetas, por favor pida a su farmacia que se ponga en contacto con nuestra oficina. Harland Dingwall de fax es [place] [phone].  Si tiene un asunto urgente cuando la clnica est cerrada y que no puede esperar hasta el siguiente da hbil, puede llamar/localizar a su doctor(a) al nmero que aparece a continuacin.   Por favor, tenga en  cuenta que aunque hacemos todo lo posible para estar disponibles para asuntos urgentes fuera del horario de [place], no estamos disponibles las 24 horas del da, los 7 das de la [place].   Si tiene un problema urgente y no puede comunicarse con nosotros, puede optar por buscar atencin mdica  en el consultorio de su doctor(a), en una clnica privada, en un centro de atencin urgente o en una sala de emergencias.  Si tiene Engineering geologist, por favor llame inmediatamente al 911 o vaya a la sala de emergencias.  Nmeros de bper  - Dr. Nehemiah Massed: [phone]  - Dra. Moye: [phone]  - Dra. Nicole Kindred: [phone]  En caso de inclemencias del [place], por favor llame a Johnsie Kindred principal al [phone] para una actualizacin sobre el [place] de cualquier retraso o cierre.  Consejos para la medicacin en dermatologa: Por favor, guarde las cajas en las que vienen los medicamentos de uso tpico para ayudarle a seguir las instrucciones sobre dnde y cmo usarlos. Las farmacias generalmente imprimen las instrucciones del medicamento slo en las cajas y no directamente en los tubos del [place].   Si su medicamento es muy caro, por favor, pngase en contacto con Zigmund Daniel llamando al [phone] y presione la opcin 4 o envenos un mensaje a travs de Pharmacist, community.   No podemos decirle cul ser su copago por los medicamentos por adelantado ya que esto es diferente dependiendo de la cobertura de su seguro. Sin embargo, es posible que podamos encontrar un medicamento sustituto a Electrical engineer un formulario para que el seguro cubra el medicamento que se considera necesario.   Si se requiere una autorizacin previa para que su compaa de seguros [place] su medicamento, por favor permtanos de 1 a 2 das hbiles para completar este proceso.  Los precios de los medicamentos varan con frecuencia dependiendo del Environmental consultant de dnde se surte la receta y alguna farmacias pueden ofrecer  precios ms baratos.  El sitio web www.goodrx.com tiene cupones para medicamentos de Airline pilot. Los precios aqu no tienen en cuenta lo que podra costar con la ayuda del seguro (puede ser ms barato con su seguro), pero el sitio web puede darle el precio si no utiliz Research scientist (physical sciences).  - Puede imprimir el cupn correspondiente y llevarlo con su receta a la farmacia.  - Tambin puede pasar  pasar por nuestra oficina durante el horario de atencin regular y recoger una tarjeta de cupones de GoodRx.  - Si necesita que su receta se enve electrnicamente a una farmacia diferente, informe a nuestra oficina a travs de MyChart de [place] o por telfono llamando al [phone] y presione la opcin 4.  

## 2022-09-09 ENCOUNTER — Encounter: Payer: Self-pay | Admitting: Dermatology

## 2023-02-08 ENCOUNTER — Other Ambulatory Visit: Payer: Self-pay

## 2023-02-08 ENCOUNTER — Inpatient Hospital Stay
Admission: EM | Admit: 2023-02-08 | Discharge: 2023-02-11 | DRG: 872 | Disposition: A | Discharge status: Home / Self Care | Payer: Medicare PPO | Attending: Internal Medicine | Admitting: Internal Medicine

## 2023-02-08 DIAGNOSIS — E860 Dehydration: Secondary | ICD-10-CM | POA: Diagnosis present

## 2023-02-08 DIAGNOSIS — E039 Hypothyroidism, unspecified: Secondary | ICD-10-CM | POA: Diagnosis present

## 2023-02-08 DIAGNOSIS — I70223 Atherosclerosis of native arteries of extremities with rest pain, bilateral legs: Secondary | ICD-10-CM | POA: Diagnosis present

## 2023-02-08 DIAGNOSIS — Z96659 Presence of unspecified artificial knee joint: Secondary | ICD-10-CM | POA: Diagnosis present

## 2023-02-08 DIAGNOSIS — Z7983 Long term (current) use of bisphosphonates: Secondary | ICD-10-CM

## 2023-02-08 DIAGNOSIS — I10 Essential (primary) hypertension: Secondary | ICD-10-CM | POA: Diagnosis not present

## 2023-02-08 DIAGNOSIS — N10 Acute tubulo-interstitial nephritis: Secondary | ICD-10-CM | POA: Diagnosis not present

## 2023-02-08 DIAGNOSIS — R509 Fever, unspecified: Secondary | ICD-10-CM | POA: Diagnosis not present

## 2023-02-08 DIAGNOSIS — I70221 Atherosclerosis of native arteries of extremities with rest pain, right leg: Secondary | ICD-10-CM

## 2023-02-08 DIAGNOSIS — N390 Urinary tract infection, site not specified: Secondary | ICD-10-CM | POA: Diagnosis present

## 2023-02-08 DIAGNOSIS — E782 Mixed hyperlipidemia: Secondary | ICD-10-CM | POA: Diagnosis present

## 2023-02-08 DIAGNOSIS — N1831 Chronic kidney disease, stage 3a: Secondary | ICD-10-CM | POA: Diagnosis present

## 2023-02-08 DIAGNOSIS — I129 Hypertensive chronic kidney disease with stage 1 through stage 4 chronic kidney disease, or unspecified chronic kidney disease: Secondary | ICD-10-CM | POA: Diagnosis present

## 2023-02-08 DIAGNOSIS — I70229 Atherosclerosis of native arteries of extremities with rest pain, unspecified extremity: Secondary | ICD-10-CM | POA: Diagnosis present

## 2023-02-08 DIAGNOSIS — E1151 Type 2 diabetes mellitus with diabetic peripheral angiopathy without gangrene: Secondary | ICD-10-CM | POA: Diagnosis present

## 2023-02-08 DIAGNOSIS — Z85828 Personal history of other malignant neoplasm of skin: Secondary | ICD-10-CM

## 2023-02-08 DIAGNOSIS — N179 Acute kidney failure, unspecified: Secondary | ICD-10-CM | POA: Diagnosis present

## 2023-02-08 DIAGNOSIS — N3281 Overactive bladder: Secondary | ICD-10-CM | POA: Diagnosis present

## 2023-02-08 DIAGNOSIS — Z79890 Hormone replacement therapy (postmenopausal): Secondary | ICD-10-CM

## 2023-02-08 DIAGNOSIS — D6959 Other secondary thrombocytopenia: Secondary | ICD-10-CM | POA: Diagnosis present

## 2023-02-08 DIAGNOSIS — R7401 Elevation of levels of liver transaminase levels: Secondary | ICD-10-CM

## 2023-02-08 DIAGNOSIS — A419 Sepsis, unspecified organism: Principal | ICD-10-CM | POA: Diagnosis present

## 2023-02-08 DIAGNOSIS — E119 Type 2 diabetes mellitus without complications: Secondary | ICD-10-CM

## 2023-02-08 DIAGNOSIS — Z7902 Long term (current) use of antithrombotics/antiplatelets: Secondary | ICD-10-CM

## 2023-02-08 DIAGNOSIS — Z602 Problems related to living alone: Secondary | ICD-10-CM | POA: Diagnosis present

## 2023-02-08 DIAGNOSIS — Z96641 Presence of right artificial hip joint: Secondary | ICD-10-CM | POA: Diagnosis present

## 2023-02-08 DIAGNOSIS — Z800 Family history of malignant neoplasm of digestive organs: Secondary | ICD-10-CM

## 2023-02-08 DIAGNOSIS — E871 Hypo-osmolality and hyponatremia: Secondary | ICD-10-CM | POA: Diagnosis not present

## 2023-02-08 DIAGNOSIS — I739 Peripheral vascular disease, unspecified: Secondary | ICD-10-CM | POA: Diagnosis present

## 2023-02-08 DIAGNOSIS — K219 Gastro-esophageal reflux disease without esophagitis: Secondary | ICD-10-CM | POA: Diagnosis present

## 2023-02-08 DIAGNOSIS — N1832 Chronic kidney disease, stage 3b: Secondary | ICD-10-CM | POA: Diagnosis present

## 2023-02-08 DIAGNOSIS — Z79899 Other long term (current) drug therapy: Secondary | ICD-10-CM

## 2023-02-08 DIAGNOSIS — I9589 Other hypotension: Secondary | ICD-10-CM | POA: Diagnosis present

## 2023-02-08 DIAGNOSIS — Z8042 Family history of malignant neoplasm of prostate: Secondary | ICD-10-CM

## 2023-02-08 DIAGNOSIS — E1122 Type 2 diabetes mellitus with diabetic chronic kidney disease: Secondary | ICD-10-CM | POA: Diagnosis present

## 2023-02-08 DIAGNOSIS — E872 Acidosis, unspecified: Secondary | ICD-10-CM | POA: Diagnosis present

## 2023-02-08 DIAGNOSIS — R6520 Severe sepsis without septic shock: Secondary | ICD-10-CM | POA: Diagnosis present

## 2023-02-08 DIAGNOSIS — I6523 Occlusion and stenosis of bilateral carotid arteries: Secondary | ICD-10-CM | POA: Diagnosis present

## 2023-02-08 HISTORY — DX: Elevation of levels of liver transaminase levels: R74.01

## 2023-02-08 LAB — CBC WITH DIFFERENTIAL/PLATELET
Abs Immature Granulocytes: 0.09 10*3/uL — ABNORMAL HIGH (ref 0.00–0.07)
Basophils Absolute: 0 10*3/uL (ref 0.0–0.1)
Basophils Relative: 0 %
Eosinophils Absolute: 0.1 10*3/uL (ref 0.0–0.5)
Eosinophils Relative: 1 %
HCT: 31 % — ABNORMAL LOW (ref 36.0–46.0)
Hemoglobin: 10.6 g/dL — ABNORMAL LOW (ref 12.0–15.0)
Immature Granulocytes: 1 %
Lymphocytes Relative: 3 %
Lymphs Abs: 0.4 10*3/uL — ABNORMAL LOW (ref 0.7–4.0)
MCH: 32.8 pg (ref 26.0–34.0)
MCHC: 34.2 g/dL (ref 30.0–36.0)
MCV: 96 fL (ref 80.0–100.0)
Monocytes Absolute: 0.3 10*3/uL (ref 0.1–1.0)
Monocytes Relative: 3 %
Neutro Abs: 10.9 10*3/uL — ABNORMAL HIGH (ref 1.7–7.7)
Neutrophils Relative %: 92 %
Platelets: 147 10*3/uL — ABNORMAL LOW (ref 150–400)
RBC: 3.23 MIL/uL — ABNORMAL LOW (ref 3.87–5.11)
RDW: 13.1 % (ref 11.5–15.5)
WBC: 11.7 10*3/uL — ABNORMAL HIGH (ref 4.0–10.5)
nRBC: 0 % (ref 0.0–0.2)

## 2023-02-08 LAB — COMPREHENSIVE METABOLIC PANEL
ALT: 55 U/L — ABNORMAL HIGH (ref 0–44)
AST: 59 U/L — ABNORMAL HIGH (ref 15–41)
Albumin: 2.6 g/dL — ABNORMAL LOW (ref 3.5–5.0)
Alkaline Phosphatase: 63 U/L (ref 38–126)
Anion gap: 8 (ref 5–15)
BUN: 30 mg/dL — ABNORMAL HIGH (ref 8–23)
CO2: 20 mmol/L — ABNORMAL LOW (ref 22–32)
Calcium: 9.5 mg/dL (ref 8.9–10.3)
Chloride: 103 mmol/L (ref 98–111)
Creatinine, Ser: 1.34 mg/dL — ABNORMAL HIGH (ref 0.44–1.00)
GFR, Estimated: 37 mL/min — ABNORMAL LOW (ref >60)
Glucose, Bld: 121 mg/dL — ABNORMAL HIGH (ref 70–99)
Potassium: 3.6 mmol/L (ref 3.5–5.1)
Sodium: 131 mmol/L — ABNORMAL LOW (ref 135–145)
Total Bilirubin: 1.5 mg/dL — ABNORMAL HIGH (ref 0.3–1.2)
Total Protein: 5.2 g/dL — ABNORMAL LOW (ref 6.5–8.1)

## 2023-02-08 LAB — URINALYSIS, ROUTINE W REFLEX MICROSCOPIC
Bilirubin Urine: NEGATIVE
Glucose, UA: NEGATIVE mg/dL
Ketones, ur: NEGATIVE mg/dL
Nitrite: NEGATIVE
Protein, ur: >=300 mg/dL — AB
Specific Gravity, Urine: 1.018 (ref 1.005–1.030)
WBC, UA: >50 WBC/hpf (ref 0–5)
pH: 5 (ref 5.0–8.0)

## 2023-02-08 LAB — LACTIC ACID, PLASMA
Lactic Acid, Venous: 3.1 mmol/L (ref 0.5–1.9)
Lactic Acid, Venous: 2.5 mmol/L (ref 0.5–1.9)
Lactic Acid, Venous: 2.3 mmol/L (ref 0.5–1.9)

## 2023-02-08 LAB — URINALYSIS, W/ REFLEX TO CULTURE (INFECTION SUSPECTED)
Bilirubin Urine: NEGATIVE
Glucose, UA: NEGATIVE mg/dL
Ketones, ur: NEGATIVE mg/dL
Nitrite: NEGATIVE
Protein, ur: >=300 mg/dL — AB
Specific Gravity, Urine: 1.018 (ref 1.005–1.030)
WBC, UA: >50 WBC/hpf (ref 0–5)
pH: 5 (ref 5.0–8.0)

## 2023-02-08 MED ORDER — ONDANSETRON HCL 4 MG/2ML IJ SOLN: 4.0000 mg | Freq: Four times a day (QID) | INTRAMUSCULAR | Status: DC | PRN

## 2023-02-08 MED ORDER — SODIUM CHLORIDE 0.9 % IV BOLUS
1000.0000 mL | Freq: Once | INTRAVENOUS | Status: AC
  Administered 2023-02-08: 1000 mL via INTRAVENOUS

## 2023-02-08 MED ORDER — ACETAMINOPHEN 650 MG RE SUPP: 650.0000 mg | Freq: Four times a day (QID) | RECTAL | Status: DC | PRN

## 2023-02-08 MED ORDER — ONDANSETRON HCL 4 MG/2ML IJ SOLN
4.0000 mg | Freq: Once | INTRAMUSCULAR | Status: AC
  Administered 2023-02-08: 4 mg via INTRAVENOUS
  Filled 2023-02-08: qty 2

## 2023-02-08 MED ORDER — ACETAMINOPHEN 325 MG PO TABS: 650.0000 mg | ORAL_TABLET | Freq: Four times a day (QID) | ORAL | Status: DC | PRN

## 2023-02-08 MED ORDER — ENOXAPARIN SODIUM 40 MG/0.4ML IJ SOSY
40.0000 mg | PREFILLED_SYRINGE | INTRAMUSCULAR | Status: DC
  Administered 2023-02-08 – 2023-02-09 (×2): 40 mg via SUBCUTANEOUS
  Filled 2023-02-08 (×2): qty 0.4

## 2023-02-08 MED ORDER — ONDANSETRON HCL 4 MG PO TABS: 4.0000 mg | ORAL_TABLET | Freq: Four times a day (QID) | ORAL | Status: DC | PRN

## 2023-02-08 MED ORDER — SUCRALFATE 1 G PO TABS
1.0000 g | ORAL_TABLET | Freq: Two times a day (BID) | ORAL | Status: DC
  Administered 2023-02-09 – 2023-02-11 (×5): 1 g via ORAL
  Filled 2023-02-08 (×5): qty 1

## 2023-02-08 MED ORDER — LACTATED RINGERS IV SOLN: INTRAVENOUS | Status: DC

## 2023-02-08 MED ORDER — LEVOTHYROXINE SODIUM 50 MCG PO TABS
75.0000 ug | ORAL_TABLET | Freq: Every day | ORAL | Status: DC
  Administered 2023-02-09 – 2023-02-11 (×3): 75 ug via ORAL
  Filled 2023-02-08: qty 2
  Filled 2023-02-08 (×2): qty 1

## 2023-02-08 MED ORDER — SODIUM CHLORIDE 0.9 % IV SOLN
2.0000 g | INTRAVENOUS | Status: DC
  Administered 2023-02-09 – 2023-02-11 (×3): 2 g via INTRAVENOUS
  Filled 2023-02-08 (×3): qty 20

## 2023-02-08 MED ORDER — ROSUVASTATIN CALCIUM 10 MG PO TABS
5.0000 mg | ORAL_TABLET | Freq: Every day | ORAL | Status: DC
  Administered 2023-02-09 – 2023-02-10 (×2): 5 mg via ORAL
  Filled 2023-02-08 (×3): qty 1

## 2023-02-08 MED ORDER — LACTATED RINGERS IV BOLUS
1000.0000 mL | Freq: Once | INTRAVENOUS | Status: AC
  Administered 2023-02-08: 1000 mL via INTRAVENOUS

## 2023-02-08 MED ORDER — SODIUM CHLORIDE 0.9 % IV SOLN
1.0000 g | Freq: Once | INTRAVENOUS | Status: AC
  Administered 2023-02-08: 1 g via INTRAVENOUS
  Filled 2023-02-08: qty 10

## 2023-02-08 MED ORDER — CLOPIDOGREL BISULFATE 75 MG PO TABS
75.0000 mg | ORAL_TABLET | Freq: Every day | ORAL | Status: DC
  Administered 2023-02-08 – 2023-02-11 (×4): 75 mg via ORAL
  Filled 2023-02-08 (×4): qty 1

## 2023-02-08 MED ORDER — PANTOPRAZOLE SODIUM 40 MG PO TBEC
40.0000 mg | DELAYED_RELEASE_TABLET | Freq: Two times a day (BID) | ORAL | Status: DC
  Administered 2023-02-08 – 2023-02-11 (×6): 40 mg via ORAL
  Filled 2023-02-08 (×6): qty 1

## 2023-02-08 NOTE — Sepsis Progress Note (Signed)
Following for sepsis monitoring ?

## 2023-02-08 NOTE — Assessment & Plan Note (Addendum)
Probably due to hypotension, abdomen benign. - Trend LFTs and image if worsneing

## 2023-02-08 NOTE — Assessment & Plan Note (Addendum)
Hypotensive and elevated lactic acid due to dehydration, does not meet sepsis criteria.  Sepsis ruled out. No hematuria, no flank pain, no history of renal stones. - Continue Rocephin - Follow urine and blood cultures - Continue IV fluids

## 2023-02-08 NOTE — Assessment & Plan Note (Signed)
Asymptomatic.  - If persists despite fluid resuscitation, will obtain urine electrolytes tomorrow

## 2023-02-08 NOTE — Assessment & Plan Note (Signed)
-   Continue levothyroxine - Check TSH--1.211 

## 2023-02-08 NOTE — H&P (Signed)
History and Physical    Patient: Erica Lane ZOX:[id number] DOB: [birth year] DOA: 02/08/2023 DOS: the patient was seen and examined on 02/08/2023 PCP: Marguarite Arbour, MD  Patient coming from: Home  Chief Complaint:  Chief Complaint  Patient presents with   Dysuria   Fever       HPI:  Erica Lane is a 89 y.o. F with HTN, PVD, hypothyroidism who presented with UTI and hypotension.  Started to develop dysuria a few days ago, then yesterday had fever and called PCP for phone and antibiotic for UTI.  In the last 24 hours has had severe shaking rigors, and progressive generalized weakness until she could not stand up.  In the ER, sodium 131, lactate 3.1, blood pressure initially normal, slowly worsening. UA with pyruria. Given fluids and antibiotics and admitted for UTI.      Review of Systems  Constitutional:  Positive for chills, fever and malaise/fatigue.  Genitourinary:  Positive for dysuria. Negative for flank pain and hematuria.  Neurological:  Positive for weakness. Negative for focal weakness.  All other systems reviewed and are negative.    Past Medical History:  Diagnosis Date   Actinic keratosis    Cancer (HCC)    skin   Heart murmur    Heartburn    HLD (hyperlipidemia)    HTN (hypertension)    Hyperthyroidism    Squamous cell carcinoma of skin 07/25/2020   L prox pretibial   Past Surgical History:  Procedure Laterality Date   broken foot     EXCISIONAL HEMORRHOIDECTOMY     GALLBLADDER SURGERY     MEDIAL PARTIAL KNEE REPLACEMENT     TOTAL HIP ARTHROPLASTY Right    from crushed hip   VAGINAL HYSTERECTOMY  1961   Social History: Lives alone.  Non-smoker.  Ambulates without cane or walker.     No Known Allergies  Family History  Problem Relation Age of Onset   Cancer Sister 32       stomach   Prostate cancer Brother    Prostate cancer Brother    Kidney cancer Neg Hx    Bladder Cancer Neg Hx    Breast cancer Neg Hx     Prior to  Admission medications   Medication Sig Start Date End Date Taking? Authorizing Provider  amLODipine (NORVASC) 10 MG tablet Take 10 mg by mouth daily with supper.   Yes [provider]  Calcium Citrate-Vitamin D ([employer] CALCIUM CITRATE +D3 MINI PO) Take 1 tablet by mouth 2 (two) times daily.   Yes [provider]  carvedilol (COREG) 3.125 MG tablet Take 3.125 mg by mouth 2 (two) times daily with a meal.   Yes [provider]  clopidogrel (PLAVIX) 75 MG tablet Take 75 mg by mouth daily.   Yes [provider]  gabapentin (NEURONTIN) 300 MG capsule Take 300 mg by mouth at bedtime.   Yes [provider]  ibandronate (BONIVA) 150 MG tablet Take 150 mg by mouth every 30 (thirty) days. Take in the morning with a full glass of water, on an empty stomach, and do not take anything else by mouth or lie down for the next 30 min.   Yes [provider]  levothyroxine (SYNTHROID) 75 MCG tablet Take 75 mcg by mouth daily before breakfast.   Yes [provider]  oxybutynin (DITROPAN-XL) 10 MG 24 hr tablet Take 10 mg by mouth at bedtime.   Yes [provider]  pantoprazole (PROTONIX) 40 MG tablet Take  40 mg by mouth 2 (two) times daily.    Yes [provider]  rosuvastatin (CRESTOR) 5 MG tablet Take 5 mg by mouth daily with supper.   Yes [provider]  sucralfate (CARAFATE) 1 g tablet Take 1 g by mouth 2 (two) times daily with a meal.   Yes [provider]  Vitamin D, Ergocalciferol, (DRISDOL) 50000 units CAPS capsule Take 50,000 Units by mouth every Thursday.   Yes [provider]    Physical Exam: Vitals:   02/08/23 1750 02/08/23 1815 02/08/23 1830 02/08/23 1900  BP:  (!) 102/48 (!) 101/50 (!) 86/57  Pulse:  63 66 65  Resp:  15 12 17   Temp: 98.5 F (36.9 C)     TempSrc:      SpO2:  93% 91% 92%  Weight:      Height:       Elderly adult female, lying in bed, responds to questions but appears  tired Conjunctiva normal, lids and lashes normal.  No nasal deformity, discharge, or epistaxis, hearing seems slightly diminished. Oropharynx tacky dry, no oral lesions, dentures in place, lips dry. Heart rate normal, soft systolic murmur noted, no significant pitting edema, no JVD Respiratory rate seems normal, lungs clear without rales or wheezes Abdomen soft no tenderness palpation or guarding in all quadrants, no CVA tenderness Attentive to questions, oriented to person, place, time, and situation, memory seems intact.  She has generalized weakness 4/5 in all 4 extremities, no sensory loss    Data Reviewed: Basic metabolic panel shows mild hyponatremia, elevated BUN to creatinine ratio, mild transaminitis CBC shows no leukocytosis Lactate 3.1 Urinalysis with bacteria and white blood cells EKG, personally reviewed, shows normal sinus rhythm    Assessment and Plan: * UTI (urinary tract infection) Hypotensive and elevated lactic acid due to dehydration, does not meet sepsis criteria.  Sepsis ruled out. No hematuria, no flank pain, no history of renal stones. - Continue Rocephin - Follow urine and blood cultures - Continue IV fluids   Hypotension due to dehydration Given her normal white count, otherwise normal vital signs, I do not think her hypotension is from septic shock at this time. - Continue IV fluids - Trend Lactic acid    Transaminitis Probably due to hypotension, abdomen benign. - Trend LFTs and image if worsneing  Hyponatremia Asymptomatic.  - If persists despite fluid resuscitation, will obtain urine electrolytes tomorrow  Atherosclerosis of native arteries of extremity with rest pain (HCC) - Continue Crestor, Plavix  Mixed hyperlipidemia - Continue statin  Hypothyroidism, unspecified - Continue levothyroxine - Check TSH  Essential hypertension BP low - Hold amlodipine, Coreg         Advance Care Planning: FULL code confirmed with partner  and friend at bedside  Consults: None  Family Communication: Partner and friend at bedside  Severity of Illness: The appropriate patient status for this patient is OBSERVATION. Observation status is judged to be reasonable and necessary in order to provide the required intensity of service to ensure the patient's safety. The patient's presenting symptoms, physical exam findings, and initial radiographic and laboratory data in the context of their medical condition is felt to place them at decreased risk for further clinical deterioration. Furthermore, it is anticipated that the patient will be medically stable for discharge from the hospital within 2 midnights of admission.   Author: Alberteen Sam, MD 02/08/2023 7:41 PM  For on call review [email or web address].

## 2023-02-08 NOTE — Hospital Course (Signed)
Erica Lane is a 89 y.o. F with HTN, PVD, hypothyroidism who presented with UTI and hypotension.  Started to develop dysuria a few days ago, then yesterday had fever and called PCP for phone and antibiotic for UTI.  In the last 24 hours has had severe shaking rigors, and progressive generalized weakness until she could not stand up.  In the ER, sodium 131, lactate 3.1, blood pressure initially normal, slowly worsening. UA with pyruria. Given fluids and antibiotics and admitted for UTI.

## 2023-02-08 NOTE — ED Notes (Signed)
Dr. Maryfrances Bunnell notified of LA 2.5. Will give 2nd ivf bolus, obtain LA after completion of the bolus and record bp after 2nd ivf bolus.

## 2023-02-08 NOTE — ED Provider Notes (Signed)
[facility] Provider Note   Event Date/Time   First MD Initiated Contact with Patient 02/08/23 1524     (approximate) History  Dysuria and Fever  HPI Erica Lane is a 89 y.o. female with a past medical history of hypertension, hypothyroidism, hyperlipidemia, and type 2 diabetes who presents complaining of altered mental status with somnolence, nausea, chills, and dysuria that has been worsening over the last week.  Patient called her primary care doctor yesterday and was prescribed ciprofloxacin of which she is only taken 1 dose this morning.  Daughter at bedside is concerned as patient has been less responsive than usual and including decreased p.o. intake and inability to get up from seated position.  Patient has also been having fevers with most recent temperature of 102.4 with EMS who was administered 640 mg of liquid Tylenol as well as 1000 mg of NS prior to arrival. ROS: Patient currently denies any vision changes, tinnitus, difficulty speaking, facial droop, sore throat, chest pain, shortness of breath, abdominal pain, nausea/vomiting/diarrhea, or numbness/paresthesias in any extremity   Physical Exam  Triage Vital Signs: ED Triage Vitals [02/08/23 1541]  Enc Vitals Group     BP (!) 105/49     Pulse Rate 61     Resp 17     Temp 99 F (37.2 C)     Temp Source Oral     SpO2 93 %     Weight 137 lb (62.1 kg)     Height 5\' 3"  (1.6 m)     Head Circumference      Peak Flow      Pain Score 0     Pain Loc      Pain Edu?      Excl. in GC?    Most recent vital signs: Vitals:   02/08/23 2030 02/08/23 2036  BP: (!) 98/47   Pulse: 60   Resp: 18   Temp:    SpO2: (!) 89% 90%   General: Awake, oriented x4. CV:  Good peripheral perfusion.  Resp:  Normal effort.  Abd:  No distention.  Other:  Elderly well-developed, well-nourished Caucasian female laying in bed in no acute distress ED Results / Procedures / Treatments  Labs (all labs ordered are  listed, but only abnormal results are displayed) Labs Reviewed  LACTIC ACID, PLASMA - Abnormal; Notable for the following components:      Result Value   Lactic Acid, Venous 3.1 (*)    All other components within normal limits  LACTIC ACID, PLASMA - Abnormal; Notable for the following components:   Lactic Acid, Venous 2.5 (*)    All other components within normal limits  COMPREHENSIVE METABOLIC PANEL - Abnormal; Notable for the following components:   Sodium 131 (*)    CO2 20 (*)    Glucose, Bld 121 (*)    BUN 30 (*)    Creatinine, Ser 1.34 (*)    Total Protein 5.2 (*)    Albumin 2.6 (*)    AST 59 (*)    ALT 55 (*)    Total Bilirubin 1.5 (*)    GFR, Estimated 37 (*)    All other components within normal limits  CBC WITH DIFFERENTIAL/PLATELET - Abnormal; Notable for the following components:   WBC 11.7 (*)    RBC 3.23 (*)    Hemoglobin 10.6 (*)    HCT 31.0 (*)    Platelets 147 (*)    Neutro Abs 10.9 (*)    Lymphs  Abs 0.4 (*)    Abs Immature Granulocytes 0.09 (*)    All other components within normal limits  URINALYSIS, ROUTINE W REFLEX MICROSCOPIC - Abnormal; Notable for the following components:   Color, Urine YELLOW (*)    APPearance HAZY (*)    Hgb urine dipstick SMALL (*)    Protein, ur >=300 (*)    Leukocytes,Ua MODERATE (*)    Bacteria, UA MANY (*)    All other components within normal limits  URINALYSIS, W/ REFLEX TO CULTURE (INFECTION SUSPECTED) - Abnormal; Notable for the following components:   Color, Urine YELLOW (*)    APPearance HAZY (*)    Hgb urine dipstick MODERATE (*)    Protein, ur >=300 (*)    Leukocytes,Ua MODERATE (*)    Bacteria, UA FEW (*)    All other components within normal limits  CULTURE, BLOOD (ROUTINE X 2)  CULTURE, BLOOD (ROUTINE X 2)  URINE CULTURE  LACTIC ACID, PLASMA   EKG ED ECG REPORT I, Merwyn Katos, the attending physician, personally viewed and interpreted this ECG. Date: 02/08/2023 EKG Time: 1547 Rate: 61 Rhythm:  normal sinus rhythm QRS Axis: normal Intervals: normal ST/T Wave abnormalities: normal Narrative Interpretation: no evidence of acute ischemia PROCEDURES: Critical Care performed: No .1-3 Lead EKG Interpretation  Performed by: Merwyn Katos, MD Authorized by: Merwyn Katos, MD     Interpretation: normal     ECG rate:  71   ECG rate assessment: normal     Rhythm: sinus rhythm     Ectopy: none     Conduction: normal    MEDICATIONS ORDERED IN ED: Medications  lactated ringers infusion (has no administration in time range)  sodium chloride 0.9 % bolus 1,000 mL (0 mLs Intravenous Stopped 02/08/23 1750)  cefTRIAXone (ROCEPHIN) 1 g in sodium chloride 0.9 % 100 mL IVPB (0 g Intravenous Stopped 02/08/23 1658)  sodium chloride 0.9 % bolus 1,000 mL (0 mLs Intravenous Stopped 02/08/23 1833)  ondansetron (ZOFRAN) injection 4 mg (4 mg Intravenous Given 02/08/23 2027)  lactated ringers bolus 1,000 mL (1,000 mLs Intravenous New Bag/Given 02/08/23 2025)   IMPRESSION / MDM / ASSESSMENT AND PLAN / ED COURSE  I reviewed the triage vital signs and the nursing notes.                             The patient is on the cardiac monitor to evaluate for evidence of arrhythmia and/or significant heart rate changes. Patient's presentation is most consistent with acute presentation with potential threat to life or bodily function. The Pt presents with dysuria, altered mental status highly concerning for sepsis (suspected urinary source). At this time, the Pt is satting well on room air, normotensive, and appears HDS.  Will start empiric antibiotics and fluids.  Due to hypotension, will administer fluids gradually with frequent reassessment. Have low suspicion for a GI, skin/soft tissue, or CNS source at this time, but will reconsider if initial workup is unremarkable.  - CBC, BMP, LFTs - VBG - UA: Evidence of urinary tract infection - BCx x2, Lactate - EKG - Empiric Abx: Rocephin - Fluids:  30cc/kgNS  Dispo: Admit to medicine   FINAL CLINICAL IMPRESSION(S) / ED DIAGNOSES   Final diagnoses:  Sepsis without acute organ dysfunction, due to unspecified organism [facility])  Urinary tract infection with hematuria, site unspecified   Rx / DC Orders   ED Discharge Orders     None  Note:  This document was prepared using Dragon voice recognition software and may include unintentional dictation errors.   Merwyn Katos, MD 02/08/23 2055

## 2023-02-08 NOTE — ED Triage Notes (Signed)
Pt arrives via EMS from home. Pt arrives AxOx4. Pt reports dysuria for the past week. States she has also been experiencing some nausea and chills over the past couple of days. Pt had a temp of 102.4 with ems. EMS administered 640mg  of liquid tylenol and of NS pta. Patient's PCP started her on abx, she had one dose this morning.

## 2023-02-08 NOTE — Assessment & Plan Note (Signed)
Continue statin. 

## 2023-02-08 NOTE — ED Notes (Signed)
CRITICAL VALUE STICKER  CRITICAL VALUE: lactic acid 3.1  RECEIVER (on-site recipient of call):I. Reggie Bise   MESSENGER (representative from lab):02/08/23 1704  MD NOTIFIED: Vicente Males  TIME OF NOTIFICATION:1704  RESPONSE:

## 2023-02-08 NOTE — Assessment & Plan Note (Signed)
BP low - Hold amlodipine, Coreg

## 2023-02-08 NOTE — Assessment & Plan Note (Signed)
-   Continue Crestor, Plavix

## 2023-02-09 ENCOUNTER — Encounter: Payer: Self-pay | Admitting: Family Medicine

## 2023-02-09 ENCOUNTER — Observation Stay: Payer: Medicare PPO

## 2023-02-09 DIAGNOSIS — E1122 Type 2 diabetes mellitus with diabetic chronic kidney disease: Secondary | ICD-10-CM | POA: Diagnosis present

## 2023-02-09 DIAGNOSIS — R6520 Severe sepsis without septic shock: Secondary | ICD-10-CM | POA: Diagnosis present

## 2023-02-09 DIAGNOSIS — A419 Sepsis, unspecified organism: Secondary | ICD-10-CM | POA: Diagnosis present

## 2023-02-09 DIAGNOSIS — E782 Mixed hyperlipidemia: Secondary | ICD-10-CM | POA: Diagnosis present

## 2023-02-09 DIAGNOSIS — Z8042 Family history of malignant neoplasm of prostate: Secondary | ICD-10-CM | POA: Diagnosis not present

## 2023-02-09 DIAGNOSIS — E872 Acidosis, unspecified: Secondary | ICD-10-CM | POA: Diagnosis present

## 2023-02-09 DIAGNOSIS — N10 Acute tubulo-interstitial nephritis: Secondary | ICD-10-CM | POA: Diagnosis present

## 2023-02-09 DIAGNOSIS — N1832 Chronic kidney disease, stage 3b: Secondary | ICD-10-CM | POA: Diagnosis present

## 2023-02-09 DIAGNOSIS — Z85828 Personal history of other malignant neoplasm of skin: Secondary | ICD-10-CM | POA: Diagnosis not present

## 2023-02-09 DIAGNOSIS — E860 Dehydration: Secondary | ICD-10-CM | POA: Diagnosis present

## 2023-02-09 DIAGNOSIS — D6959 Other secondary thrombocytopenia: Secondary | ICD-10-CM | POA: Diagnosis present

## 2023-02-09 DIAGNOSIS — I6523 Occlusion and stenosis of bilateral carotid arteries: Secondary | ICD-10-CM | POA: Diagnosis present

## 2023-02-09 DIAGNOSIS — R509 Fever, unspecified: Secondary | ICD-10-CM | POA: Diagnosis present

## 2023-02-09 DIAGNOSIS — I129 Hypertensive chronic kidney disease with stage 1 through stage 4 chronic kidney disease, or unspecified chronic kidney disease: Secondary | ICD-10-CM | POA: Diagnosis present

## 2023-02-09 DIAGNOSIS — I10 Essential (primary) hypertension: Secondary | ICD-10-CM | POA: Diagnosis not present

## 2023-02-09 DIAGNOSIS — K219 Gastro-esophageal reflux disease without esophagitis: Secondary | ICD-10-CM | POA: Diagnosis present

## 2023-02-09 DIAGNOSIS — E1151 Type 2 diabetes mellitus with diabetic peripheral angiopathy without gangrene: Secondary | ICD-10-CM | POA: Diagnosis present

## 2023-02-09 DIAGNOSIS — I70223 Atherosclerosis of native arteries of extremities with rest pain, bilateral legs: Secondary | ICD-10-CM | POA: Diagnosis present

## 2023-02-09 DIAGNOSIS — Z602 Problems related to living alone: Secondary | ICD-10-CM | POA: Diagnosis present

## 2023-02-09 DIAGNOSIS — N179 Acute kidney failure, unspecified: Secondary | ICD-10-CM | POA: Diagnosis present

## 2023-02-09 DIAGNOSIS — Z79890 Hormone replacement therapy (postmenopausal): Secondary | ICD-10-CM | POA: Diagnosis not present

## 2023-02-09 DIAGNOSIS — Z96659 Presence of unspecified artificial knee joint: Secondary | ICD-10-CM | POA: Diagnosis present

## 2023-02-09 DIAGNOSIS — E871 Hypo-osmolality and hyponatremia: Secondary | ICD-10-CM | POA: Diagnosis present

## 2023-02-09 DIAGNOSIS — E039 Hypothyroidism, unspecified: Secondary | ICD-10-CM | POA: Diagnosis present

## 2023-02-09 DIAGNOSIS — N1831 Chronic kidney disease, stage 3a: Secondary | ICD-10-CM | POA: Diagnosis not present

## 2023-02-09 DIAGNOSIS — Z96641 Presence of right artificial hip joint: Secondary | ICD-10-CM | POA: Diagnosis present

## 2023-02-09 DIAGNOSIS — N3000 Acute cystitis without hematuria: Secondary | ICD-10-CM | POA: Diagnosis not present

## 2023-02-09 DIAGNOSIS — I9589 Other hypotension: Secondary | ICD-10-CM | POA: Diagnosis present

## 2023-02-09 HISTORY — DX: Sepsis, unspecified organism: A41.9

## 2023-02-09 LAB — COMPREHENSIVE METABOLIC PANEL
ALT: 44 U/L (ref 0–44)
AST: 36 U/L (ref 15–41)
Albumin: 2.4 g/dL — ABNORMAL LOW (ref 3.5–5.0)
Alkaline Phosphatase: 51 U/L (ref 38–126)
Anion gap: 7 (ref 5–15)
BUN: 25 mg/dL — ABNORMAL HIGH (ref 8–23)
CO2: 18 mmol/L — ABNORMAL LOW (ref 22–32)
Calcium: 8.6 mg/dL — ABNORMAL LOW (ref 8.9–10.3)
Chloride: 105 mmol/L (ref 98–111)
Creatinine, Ser: 1.23 mg/dL — ABNORMAL HIGH (ref 0.44–1.00)
GFR, Estimated: 41 mL/min — ABNORMAL LOW (ref >60)
Glucose, Bld: 105 mg/dL — ABNORMAL HIGH (ref 70–99)
Potassium: 4.1 mmol/L (ref 3.5–5.1)
Sodium: 130 mmol/L — ABNORMAL LOW (ref 135–145)
Total Bilirubin: 0.8 mg/dL (ref 0.3–1.2)
Total Protein: 4.9 g/dL — ABNORMAL LOW (ref 6.5–8.1)

## 2023-02-09 LAB — CBC
HCT: 32.2 % — ABNORMAL LOW (ref 36.0–46.0)
Hemoglobin: 10.6 g/dL — ABNORMAL LOW (ref 12.0–15.0)
MCH: 32.1 pg (ref 26.0–34.0)
MCHC: 32.9 g/dL (ref 30.0–36.0)
MCV: 97.6 fL (ref 80.0–100.0)
Platelets: 122 10*3/uL — ABNORMAL LOW (ref 150–400)
RBC: 3.3 MIL/uL — ABNORMAL LOW (ref 3.87–5.11)
RDW: 13.3 % (ref 11.5–15.5)
WBC: 21.5 10*3/uL — ABNORMAL HIGH (ref 4.0–10.5)
nRBC: 0 % (ref 0.0–0.2)

## 2023-02-09 LAB — TSH: TSH: 1.342 u[IU]/mL (ref 0.350–4.500)

## 2023-02-09 LAB — CULTURE, BLOOD (ROUTINE X 2)

## 2023-02-09 LAB — LACTIC ACID, PLASMA: Lactic Acid, Venous: 1.6 mmol/L (ref 0.5–1.9)

## 2023-02-09 MED ORDER — SODIUM CHLORIDE 0.45 % IV SOLN
INTRAVENOUS | Status: DC
  Filled 2023-02-09 (×3): qty 75

## 2023-02-09 MED ORDER — GABAPENTIN 300 MG PO CAPS
300.0000 mg | ORAL_CAPSULE | Freq: Every day | ORAL | Status: DC
  Administered 2023-02-09 – 2023-02-10 (×2): 300 mg via ORAL
  Filled 2023-02-09 (×2): qty 1

## 2023-02-09 NOTE — Progress Notes (Addendum)
PROGRESS NOTE    Erica Lane  ZOX:[id number] DOB: 18-Feb-1928 DOA: 02/08/2023 PCP: Marguarite Arbour, MD  Outpatient Specialists: cardiology    Brief Narrative:   From admission h and p  Erica Lane is a 89 y.o. F with HTN, PVD, hypothyroidism who presented with UTI and hypotension.   Started to develop dysuria a few days ago, then yesterday had fever and called PCP for phone and antibiotic for UTI.  In the last 24 hours has had severe shaking rigors, and progressive generalized weakness until she could not stand up.   In the ER, sodium 131, lactate 3.1, blood pressure initially normal, slowly worsening. UA with pyruria. Given fluids and antibiotics and admitted for UTI.   Assessment & Plan:   Principal Problem:   UTI (urinary tract infection) Active Problems:   Bilateral carotid artery stenosis   Diabetes mellitus type 2, uncomplicated (HCC)   Essential hypertension   GERD (gastroesophageal reflux disease)   Hypothyroidism, unspecified   Mixed hyperlipidemia   Peripheral arterial disease (HCC)   Hyponatremia   Transaminitis   CKD stage 3a, GFR 45-59 ml/min (HCC)   Sepsis (HCC)   Acute pyelonephritis   # Pyelonephritis One week dysuria, also with subjective fever. Patient does endorse mild flank pain today so checked non-con CT (given gfr) which shows right perinephric and periureteral stranding consistent w/ pyelo. No obstruction seen though does have kidney stones. - continue ceftriaxone - f/u blood and urine cultures  # Sepsis, severe # Metabolic acidosis By elevated lactate, leukocytosis. Lactate normalized. Bicarb 18 - continue fluids but will switch to sodium bicarb  # Debility - PT consult  # AKI on ckd 3a Mild, cr 1.34, baseline 1, improved to 1.23 with fluids - continue fluids - ct as above  # Transaminitis 2/2 sepsis, resolved with fluids  # Thrombocytopenia Mild, likely 2/2 sepsis - monitor, further w/u if worsens  # Hyponatremia Low 130s,  possibly 2/2 dehydration from sepsis - continue fluids for now and monitor, further w/u if worsens  # PAD Asymptomatic - cont home statin, plavix  # Hypothyroid - cont home synthroid  # HTN Here low bps on arrival with sepsis, now normalized - holding home amlodipine and coreg for now  # Overactive bladder - oxybutynin on hold, probably best to hold this at d/c given uti  # Pain - cont home gabapentin  # GERD - home ppi and sucralfate    DVT prophylaxis: lovenox Code Status: full Family Communication: daughter updated  Level of care: Med-Surg Status is: inpt    Consultants:  none  Procedures: none  Antimicrobials:  ceftriaxone    Subjective: Feeling ok, no pain  Objective: Vitals:   02/09/23 0745 02/09/23 0830 02/09/23 0900 02/09/23 0930  BP:  125/64 (!) 127/55 127/65  Pulse:  66 64 70  Resp:  (!) 26 18 20   Temp:      TempSrc:      SpO2: 95%  94% 98%  Weight:      Height:        Intake/Output Summary (Last 24 hours) at 02/09/2023 1355 Last data filed at 02/09/2023 0926 Gross per 24 hour  Intake 2719.14 ml  Output --  Net 2719.14 ml   Filed Weights   02/08/23 1541  Weight: 62.1 kg    Examination:  General exam: Appears calm and comfortable  Respiratory system: Clear to auscultation. Respiratory effort normal. Cardiovascular system: S1 & S2 heard, RRR. No JVD, murmurs, rubs, gallops or clicks. No  pedal edema. Gastrointestinal system: Abdomen is nondistended, soft and nontender. No organomegaly or masses felt. Normal bowel sounds heard. Right flank tenderness mild Central nervous system: Alert and oriented. No focal neurological deficits. Extremities: Symmetric 5 x 5 power. Skin: No rashes, lesions or ulcers Psychiatry: Judgement and insight appear normal. Mood & affect appropriate.     Data Reviewed: I have personally reviewed following labs and imaging studies  CBC: Recent Labs  Lab 02/08/23 1545 02/09/23 0356  WBC 11.7* 21.5*   NEUTROABS 10.9*  --   HGB 10.6* 10.6*  HCT 31.0* 32.2*  MCV 96.0 97.6  PLT 147* 122*   Basic Metabolic Panel: Recent Labs  Lab 02/08/23 1545 02/09/23 0356  NA 131* 130*  K 3.6 4.1  CL 103 105  CO2 20* 18*  GLUCOSE 121* 105*  BUN 30* 25*  CREATININE 1.34* 1.23*  CALCIUM 9.5 8.6*   GFR: Estimated Creatinine Clearance: 23.6 mL/min (A) (by C-G formula based on SCr of 1.23 mg/dL (H)). Liver Function Tests: Recent Labs  Lab 02/08/23 1545 02/09/23 0356  AST 59* 36  ALT 55* 44  ALKPHOS 63 51  BILITOT 1.5* 0.8  PROT 5.2* 4.9*  ALBUMIN 2.6* 2.4*   No results for input(s): "LIPASE", "AMYLASE" in the last 168 hours. No results for input(s): "AMMONIA" in the last 168 hours. Coagulation Profile: No results for input(s): "INR", "PROTIME" in the last 168 hours. Cardiac Enzymes: No results for input(s): "CKTOTAL", "CKMB", "CKMBINDEX", "TROPONINI" in the last 168 hours. BNP (last 3 results) No results for input(s): "PROBNP" in the last 8760 hours. HbA1C: No results for input(s): "HGBA1C" in the last 72 hours. CBG: No results for input(s): "GLUCAP" in the last 168 hours. Lipid Profile: No results for input(s): "CHOL", "HDL", "LDLCALC", "TRIG", "CHOLHDL", "LDLDIRECT" in the last 72 hours. Thyroid Function Tests: Recent Labs    02/09/23 0356  TSH 1.342   Anemia Panel: No results for input(s): "VITAMINB12", "FOLATE", "FERRITIN", "TIBC", "IRON", "RETICCTPCT" in the last 72 hours. Urine analysis:    Component Value Date/Time   COLORURINE YELLOW (A) 02/08/2023 1603   COLORURINE YELLOW (A) 02/08/2023 1603   APPEARANCEUR HAZY (A) 02/08/2023 1603   APPEARANCEUR HAZY (A) 02/08/2023 1603   APPEARANCEUR Clear 03/16/2018 0929   LABSPEC 1.018 02/08/2023 1603   LABSPEC 1.018 02/08/2023 1603   LABSPEC 1.020 06/09/2013 1556   PHURINE 5.0 02/08/2023 1603   PHURINE 5.0 02/08/2023 1603   GLUCOSEU NEGATIVE 02/08/2023 1603   GLUCOSEU NEGATIVE 02/08/2023 1603   GLUCOSEU Negative  06/09/2013 1556   HGBUR SMALL (A) 02/08/2023 1603   HGBUR MODERATE (A) 02/08/2023 1603   BILIRUBINUR NEGATIVE 02/08/2023 1603   BILIRUBINUR NEGATIVE 02/08/2023 1603   BILIRUBINUR Negative 03/16/2018 0929   BILIRUBINUR Negative 06/09/2013 1556   KETONESUR NEGATIVE 02/08/2023 1603   KETONESUR NEGATIVE 02/08/2023 1603   PROTEINUR >=300 (A) 02/08/2023 1603   PROTEINUR >=300 (A) 02/08/2023 1603   NITRITE NEGATIVE 02/08/2023 1603   NITRITE NEGATIVE 02/08/2023 1603   LEUKOCYTESUR MODERATE (A) 02/08/2023 1603   LEUKOCYTESUR MODERATE (A) 02/08/2023 1603   LEUKOCYTESUR Negative 06/09/2013 1556   Sepsis Labs: @LABRCNTIP (procalcitonin:4,lacticidven:4)  ) Recent Results (from the past 240 hour(s))  Culture, blood (routine x 2)     Status: None (Preliminary result)   Collection Time: 02/08/23  4:08 PM   Specimen: BLOOD  Result Value Ref Range Status   Specimen Description BLOOD BLOOD LEFT HAND  Final   Special Requests   Final    BOTTLES DRAWN AEROBIC AND ANAEROBIC  Blood Culture adequate volume   Culture   Final    NO GROWTH < 24 HOURS Performed at [facility], [address] [place], [place] [address]    Report Status PENDING  Incomplete  Culture, blood (routine x 2)     Status: None (Preliminary result)   Collection Time: 02/08/23  4:13 PM   Specimen: BLOOD  Result Value Ref Range Status   Specimen Description BLOOD BLOOD RIGHT HAND  Final   Special Requests   Final    BOTTLES DRAWN AEROBIC AND ANAEROBIC Blood Culture adequate volume   Culture   Final    NO GROWTH < 24 HOURS Performed at [facility], [address], [place], [place] [address]    Report Status PENDING  Incomplete         Radiology Studies: CT ABDOMEN PELVIS WO CONTRAST  Result Date: 02/09/2023 CLINICAL DATA:  One-week history of dysuria associated with nausea and fever/chills EXAM: CT ABDOMEN AND PELVIS WITHOUT CONTRAST TECHNIQUE: Multidetector CT imaging of the abdomen and pelvis was  performed following the standard protocol without IV contrast. RADIATION DOSE REDUCTION: This exam was performed according to the departmental dose-optimization program which includes automated exposure control, adjustment of the mA and/or kV according to patient size and/or use of iterative reconstruction technique. COMPARISON:  CT abdomen and pelvis dated 10/07/2016 FINDINGS: Lower chest: No focal consolidation or pulmonary nodule in the lung bases. Small bilateral pleural effusions. Partially imaged heart size is normal. Hepatobiliary: No focal hepatic lesions. No intra or extrahepatic biliary ductal dilation. Cholecystectomy. Pancreas: Diffuse fatty replacement of the pancreas. Spleen: Normal in size without focal abnormality. Adrenals/Urinary Tract: No adrenal nodules. No suspicious renal mass or hydronephrosis. Bilateral renal stones measuring up to 7 mm in the right interpolar kidney. Mild right perinephric and periureteral stranding with mural thickening of the right renal collecting system. Multiple phleboliths along the course of the right ureter. No focal bladder wall thickening. Stomach/Bowel: Moderate hiatal hernia. No evidence of bowel wall thickening, distention, or inflammatory changes. Colonic diverticulosis without acute diverticulitis. Appendectomy. Vascular/Lymphatic: Aortic atherosclerosis. No enlarged abdominal or pelvic lymph nodes. Reproductive: No adnexal masses. Other: Small volume free fluid.  No free air or fluid collection. Musculoskeletal: No acute or abnormal lytic or blastic osseous lesions. Status post right femoral fixation. Multilevel degenerative changes of the partially imaged thoracic and lumbar spine. Unchanged grade 1 anterolisthesis L3-4. Age indeterminate compression deformities of L5, new from 10/07/2016. Unchanged compression deformity of T11. Trace subcutaneous emphysema in the right anterior abdominal wall may be injection related. IMPRESSION: 1. Mild right perinephric  and periureteral stranding with mural thickening of the right renal collecting system, suspicious for ascending urinary tract infection. 2. Bilateral renal stones measuring up to 7 mm in the right interpolar kidney. No hydronephrosis. 3. Age indeterminate compression deformities of L5, new from 10/07/2016. 4. Small bilateral pleural effusions. 5. Moderate hiatal hernia. 6.  Aortic Atherosclerosis (ICD10-I70.0). Electronically Signed   By: Agustin Cree M.D.   On: 02/09/2023 09:31        Scheduled Meds:  clopidogrel  75 mg Oral Daily   enoxaparin (LOVENOX) injection  40 mg Subcutaneous Q24H   gabapentin  300 mg Oral QHS   levothyroxine  75 mcg Oral QAC breakfast   pantoprazole  40 mg Oral BID   rosuvastatin  5 mg Oral Q supper   sucralfate  1 g Oral BID WC   Continuous Infusions:  cefTRIAXone (ROCEPHIN)  IV Stopped (02/09/23 0981)  sodium bicarbonate 75 mEq in sodium chloride 0.45 % 1,075 mL infusion       LOS: 0 days     Silvano Bilis, MD [facility]   If 7PM-7AM, please contact night-coverage [email or web address] Password [email or web address] 02/09/2023, 1:55 PM

## 2023-02-09 NOTE — Evaluation (Signed)
Physical Therapy Evaluation Patient Details Name: KRYSTYNE TEWKSBURY MRN: [id number] DOB: Aug 22, 1927 Today's Date: 02/09/2023  History of Present Illness  Patient is a 89 year old female coming from home with UTI and hypotension  Clinical Impression  PT evaluation completed. Patient reports she is independent at baseline and lives alone. She has supportive friends and family. She is hopeful to return home at discharge.  The patient was mildly unsteady with short distance ambulation without device. With rolling walker, patient ambulated in the hallway with no loss of balance, Min guard for safety. No shortness of breath is noted with activity. The patient feels generalized weakness compared to baseline. Recommend PT follow up to maximize independence in preparation for return home with family support.      Recommendations for follow up therapy are one component of a multi-disciplinary discharge planning process, led by the attending physician.  Recommendations may be updated based on patient status, additional functional criteria and insurance authorization.  Follow Up Recommendations       Assistance Recommended at Discharge Intermittent Supervision/Assistance  Patient can return home with the following  Help with stairs or ramp for entrance;Assist for transportation;Assistance with cooking/housework    Equipment Recommendations None recommended by PT  Recommendations for Other Services       Functional Status Assessment Patient has had a recent decline in their functional status and demonstrates the ability to make significant improvements in function in a reasonable and predictable amount of time.     Precautions / Restrictions Precautions Precautions: Fall Restrictions Weight Bearing Restrictions: No      Mobility  Bed Mobility Overal bed mobility: Needs Assistance Bed Mobility: Supine to Sit, Sit to Supine     Supine to sit: Supervision, HOB elevated Sit to supine:  Supervision, HOB elevated   General bed mobility comments: increased time and effort with no physical assistance required    Transfers Overall transfer level: Needs assistance Equipment used: None Transfers: Sit to/from Stand Sit to Stand: Min guard           General transfer comment: 2 standing bouts performed with Min guard    Ambulation/Gait Ambulation/Gait assistance: Min guard Gait Distance (Feet): 100 Feet Assistive device: Rolling walker (2 wheels) Gait Pattern/deviations: Decreased stride length Gait velocity: decreased     General Gait Details: patient ambulated in hallway using rolling walker with Min guard for safety. she walked a short distance in the room with general unsteadiness, therefore rolling walker recommended for safety. no shortness of breath is noted with activity. Sp02 92% after walking on room air. unable to get acurate pleth for Sp02 on room air with ambulation  Stairs            Wheelchair Mobility    Modified Rankin (Stroke Patients Only)       Balance Overall balance assessment: Needs assistance Sitting-balance support: Feet supported Sitting balance-Leahy Scale: Good     Standing balance support: Single extremity supported Standing balance-Leahy Scale: Fair                               Pertinent Vitals/Pain Pain Assessment Pain Assessment: No/denies pain    Home Living Family/patient expects to be discharged to:: Private residence Living Arrangements: Alone Available Help at Discharge: Friend(s);Family Type of Home: House Home Access: Stairs to enter Entrance Stairs-Rails: Doctor, general practice of Steps: 2   Home Layout: One level Home Equipment: Agricultural consultant (2 wheels);BSC/3in1;Crutches;Wheelchair - manual (  transport chair)      Prior Function Prior Level of Function : Independent/Modified Independent (driving PRN)             Mobility Comments: independent with no device with  ambulation       Hand Dominance        Extremity/Trunk Assessment   Upper Extremity Assessment Upper Extremity Assessment: Overall WFL for tasks assessed    Lower Extremity Assessment Lower Extremity Assessment: Generalized weakness (endurance impaired for sustained activity, patient reports generalized weakness)       Communication   Communication: No difficulties  Cognition Arousal/Alertness: Awake/alert Behavior During Therapy: WFL for tasks assessed/performed Overall Cognitive Status: Within Functional Limits for tasks assessed                                          General Comments      Exercises     Assessment/Plan    PT Assessment Patient needs continued PT services  PT Problem List Decreased strength;Decreased activity tolerance;Decreased balance;Decreased mobility;Decreased safety awareness       PT Treatment Interventions DME instruction;Gait training;Stair training;Therapeutic activities;Functional mobility training;Therapeutic exercise;Balance training;Neuromuscular re-education;Cognitive remediation;Patient/family education    PT Goals (Current goals can be found in the Care Plan section)  Acute Rehab PT Goals Patient Stated Goal: to go home PT Goal Formulation: With patient Time For Goal Achievement: 02/23/23 Potential to Achieve Goals: Good    Frequency Min 3X/week     Co-evaluation               AM-PAC PT "6 Clicks" Mobility  Outcome Measure Help needed turning from your back to your side while in a flat bed without using bedrails?: A Little Help needed moving from lying on your back to sitting on the side of a flat bed without using bedrails?: A Little Help needed moving to and from a bed to a chair (including a wheelchair)?: A Little Help needed standing up from a chair using your arms (e.g., wheelchair or bedside chair)?: A Little Help needed to walk in hospital room?: A Little Help needed climbing 3-5 steps  with a railing? : A Little 6 Click Score: 18    End of Session   Activity Tolerance: Patient tolerated treatment well Patient left: in bed;with call bell/phone within reach Nurse Communication: Mobility status PT Visit Diagnosis: Muscle weakness (generalized) (M62.81);Unsteadiness on feet (R26.81)    Time: 9604-5409 PT Time Calculation (min) (ACUTE ONLY): 23 min   Charges:   PT Evaluation $PT Eval Low Complexity: 1 Low PT Treatments $Therapeutic Activity: 8-22 mins        Donna Bernard, PT, MPT   Ina Homes 02/09/2023, 1:15 PM

## 2023-02-10 DIAGNOSIS — E871 Hypo-osmolality and hyponatremia: Secondary | ICD-10-CM | POA: Diagnosis not present

## 2023-02-10 DIAGNOSIS — N10 Acute tubulo-interstitial nephritis: Secondary | ICD-10-CM | POA: Diagnosis not present

## 2023-02-10 DIAGNOSIS — N3000 Acute cystitis without hematuria: Secondary | ICD-10-CM

## 2023-02-10 LAB — CULTURE, BLOOD (ROUTINE X 2): Culture: NO GROWTH

## 2023-02-10 LAB — BASIC METABOLIC PANEL
Anion gap: 6 (ref 5–15)
BUN: 21 mg/dL (ref 8–23)
CO2: 24 mmol/L (ref 22–32)
Calcium: 8.9 mg/dL (ref 8.9–10.3)
Chloride: 103 mmol/L (ref 98–111)
Creatinine, Ser: 1.03 mg/dL — ABNORMAL HIGH (ref 0.44–1.00)
GFR, Estimated: 51 mL/min — ABNORMAL LOW (ref >60)
Glucose, Bld: 101 mg/dL — ABNORMAL HIGH (ref 70–99)
Potassium: 3.8 mmol/L (ref 3.5–5.1)
Sodium: 133 mmol/L — ABNORMAL LOW (ref 135–145)

## 2023-02-10 LAB — URINE CULTURE

## 2023-02-10 LAB — CBC
HCT: 32.7 % — ABNORMAL LOW (ref 36.0–46.0)
Hemoglobin: 11 g/dL — ABNORMAL LOW (ref 12.0–15.0)
MCH: 32 pg (ref 26.0–34.0)
MCHC: 33.6 g/dL (ref 30.0–36.0)
MCV: 95.1 fL (ref 80.0–100.0)
Platelets: 124 10*3/uL — ABNORMAL LOW (ref 150–400)
RBC: 3.44 MIL/uL — ABNORMAL LOW (ref 3.87–5.11)
RDW: 13.3 % (ref 11.5–15.5)
WBC: 14.2 10*3/uL — ABNORMAL HIGH (ref 4.0–10.5)
nRBC: 0 % (ref 0.0–0.2)

## 2023-02-10 MED ORDER — ENOXAPARIN SODIUM 30 MG/0.3ML IJ SOSY
30.0000 mg | PREFILLED_SYRINGE | INTRAMUSCULAR | Status: DC
  Administered 2023-02-10: 30 mg via SUBCUTANEOUS
  Filled 2023-02-10: qty 0.3

## 2023-02-10 NOTE — Progress Notes (Signed)
PROGRESS NOTE    Erica Lane  AOZ:[id number] DOB: 1928/05/19 DOA: 02/08/2023 PCP: Marguarite Arbour, MD  Outpatient Specialists: cardiology    Brief Narrative:   From admission h and p  Erica Lane is a 89 y.o. F with HTN, PVD, hypothyroidism who presented with UTI and hypotension.   Started to develop dysuria a few days ago, then yesterday had fever and called PCP for phone and antibiotic for UTI.  In the last 24 hours has had severe shaking rigors, and progressive generalized weakness until she could not stand up.   In the ER, sodium 131, lactate 3.1, blood pressure initially normal, slowly worsening. UA with pyruria. Given fluids and antibiotics and admitted for UTI.   Assessment & Plan:   Principal Problem:   UTI (urinary tract infection) Active Problems:   Bilateral carotid artery stenosis   Diabetes mellitus type 2, uncomplicated (HCC)   Essential hypertension   GERD (gastroesophageal reflux disease)   Hypothyroidism, unspecified   Mixed hyperlipidemia   Peripheral arterial disease (HCC)   Hyponatremia   Transaminitis   CKD stage 3a, GFR 45-59 ml/min (HCC)   Sepsis (HCC)   Acute pyelonephritis   # Pyelonephritis One week dysuria, also with subjective fever. Patient does endorse mild flank pain today so checked non-con CT (given gfr) which shows right perinephric and periureteral stranding consistent w/ pyelo. No obstruction seen though does have kidney stones. - continue ceftriaxone - f/u blood and urine cultures  # Sepsis, severe # Metabolic acidosis By elevated lactate, leukocytosis. Lactate normalized. Bicarb 18 6/26: acidosis improved with bicarb fluids - stop fluids  - encourage PO hydration  # Debility - PT consult  - HH PT recommended - TOC consulted  # AKI on ckd 3a Mild, cr 1.34, baseline 1, improved to 1.23 >> 1.03 with fluids - stopping IV  fluids - ct as above  # Transaminitis 2/2 sepsis, resolved with fluids  #  Thrombocytopenia Mild, likely 2/2 sepsis - monitor, further w/u if worsens  # Hyponatremia Low 130s, possibly 2/2 dehydration from sepsis Improved to 133 with fluids - stop IV fluids  - encourage PO hydration  # PAD Asymptomatic - cont home statin, plavix  # Hypothyroid - cont home synthroid  # HTN Here low bps on arrival with sepsis, now normalized - holding home amlodipine and coreg for now  # Overactive bladder - oxybutynin on hold, probably best to hold this at d/c given uti  # Pain - cont home gabapentin  # GERD - home ppi and sucralfate    DVT prophylaxis: lovenox Code Status: full Family Communication: husband updated at bedside on rounds  Level of care: Med-Surg Status is: inpt --- remains on IV antibiotics pending culture results    Consultants:  none  Procedures: none  Antimicrobials:  ceftriaxone    Subjective: Pt feeling okay.  No fever/chills, N/V/D or other acute complaints.  Objective: Vitals:   02/09/23 2335 02/10/23 0629 02/10/23 0856 02/10/23 1126  BP: (!) 128/53 137/62 (!) 148/60 131/63  Pulse: 66 68 67 68  Resp:   18 18  Temp: 99.2 F (37.3 C) 98.8 F (37.1 C) 98.9 F (37.2 C) 98.9 F (37.2 C)  TempSrc:    Oral  SpO2: 91% 90% 90% 95%  Weight:      Height:        Intake/Output Summary (Last 24 hours) at 02/10/2023 1230 Last data filed at 02/10/2023 1103 Gross per 24 hour  Intake 290 ml  Output --  Net 290 ml   Filed Weights   02/08/23 1541  Weight: 62.1 kg    Examination:  General exam: awake, alert, no acute distress HEENT: moist mucus membranes, hearing grossly normal  Respiratory system: CTAB, no wheezes, rales or rhonchi, normal respiratory effort. Cardiovascular system: normal S1/S2, RRR, no pedal edema.   Gastrointestinal system: soft, NT, ND, no HSM felt, +bowel sounds. Central nervous system: no gross focal neurologic deficits, normal speech Extremities: moves all, no edema, normal tone Skin: dry,  intact, normal temperature Psychiatry: normal mood, congruent affect    Data Reviewed: I have personally reviewed following labs and imaging studies  CBC: Recent Labs  Lab 02/08/23 1545 02/09/23 0356 02/10/23 0800  WBC 11.7* 21.5* 14.2*  NEUTROABS 10.9*  --   --   HGB 10.6* 10.6* 11.0*  HCT 31.0* 32.2* 32.7*  MCV 96.0 97.6 95.1  PLT 147* 122* 124*   Basic Metabolic Panel: Recent Labs  Lab 02/08/23 1545 02/09/23 0356 02/10/23 0800  NA 131* 130* 133*  K 3.6 4.1 3.8  CL 103 105 103  CO2 20* 18* 24  GLUCOSE 121* 105* 101*  BUN 30* 25* 21  CREATININE 1.34* 1.23* 1.03*  CALCIUM 9.5 8.6* 8.9   GFR: Estimated Creatinine Clearance: 28.2 mL/min (A) (by C-G formula based on SCr of 1.03 mg/dL (H)). Liver Function Tests: Recent Labs  Lab 02/08/23 1545 02/09/23 0356  AST 59* 36  ALT 55* 44  ALKPHOS 63 51  BILITOT 1.5* 0.8  PROT 5.2* 4.9*  ALBUMIN 2.6* 2.4*   No results for input(s): "LIPASE", "AMYLASE" in the last 168 hours. No results for input(s): "AMMONIA" in the last 168 hours. Coagulation Profile: No results for input(s): "INR", "PROTIME" in the last 168 hours. Cardiac Enzymes: No results for input(s): "CKTOTAL", "CKMB", "CKMBINDEX", "TROPONINI" in the last 168 hours. BNP (last 3 results) No results for input(s): "PROBNP" in the last 8760 hours. HbA1C: No results for input(s): "HGBA1C" in the last 72 hours. CBG: No results for input(s): "GLUCAP" in the last 168 hours. Lipid Profile: No results for input(s): "CHOL", "HDL", "LDLCALC", "TRIG", "CHOLHDL", "LDLDIRECT" in the last 72 hours. Thyroid Function Tests: Recent Labs    02/09/23 0356  TSH 1.342   Anemia Panel: No results for input(s): "VITAMINB12", "FOLATE", "FERRITIN", "TIBC", "IRON", "RETICCTPCT" in the last 72 hours. Urine analysis:    Component Value Date/Time   COLORURINE YELLOW (A) 02/08/2023 1603   COLORURINE YELLOW (A) 02/08/2023 1603   APPEARANCEUR HAZY (A) 02/08/2023 1603   APPEARANCEUR  HAZY (A) 02/08/2023 1603   APPEARANCEUR Clear 03/16/2018 0929   LABSPEC 1.018 02/08/2023 1603   LABSPEC 1.018 02/08/2023 1603   LABSPEC 1.020 06/09/2013 1556   PHURINE 5.0 02/08/2023 1603   PHURINE 5.0 02/08/2023 1603   GLUCOSEU NEGATIVE 02/08/2023 1603   GLUCOSEU NEGATIVE 02/08/2023 1603   GLUCOSEU Negative 06/09/2013 1556   HGBUR SMALL (A) 02/08/2023 1603   HGBUR MODERATE (A) 02/08/2023 1603   BILIRUBINUR NEGATIVE 02/08/2023 1603   BILIRUBINUR NEGATIVE 02/08/2023 1603   BILIRUBINUR Negative 03/16/2018 0929   BILIRUBINUR Negative 06/09/2013 1556   KETONESUR NEGATIVE 02/08/2023 1603   KETONESUR NEGATIVE 02/08/2023 1603   PROTEINUR >=300 (A) 02/08/2023 1603   PROTEINUR >=300 (A) 02/08/2023 1603   NITRITE NEGATIVE 02/08/2023 1603   NITRITE NEGATIVE 02/08/2023 1603   LEUKOCYTESUR MODERATE (A) 02/08/2023 1603   LEUKOCYTESUR MODERATE (A) 02/08/2023 1603   LEUKOCYTESUR Negative 06/09/2013 1556   Sepsis Labs: @LABRCNTIP (procalcitonin:4,lacticidven:4)  ) Recent Results (from the past 240 hour(s))  Urine Culture     Status: Abnormal   Collection Time: 02/08/23  4:03 PM   Specimen: Urine, Random  Result Value Ref Range Status   Specimen Description   Final    URINE, RANDOM Performed at [facility], [address] [place], [place] [address]    Special Requests   Final    NONE Reflexed from [phone] Performed at [facility], [address] [place], [place] [address]    Culture MULTIPLE SPECIES PRESENT, SUGGEST RECOLLECTION (A)  Final   Report Status 02/10/2023 FINAL  Final  Culture, blood (routine x 2)     Status: None (Preliminary result)   Collection Time: 02/08/23  4:08 PM   Specimen: BLOOD  Result Value Ref Range Status   Specimen Description BLOOD BLOOD LEFT HAND  Final   Special Requests   Final    BOTTLES DRAWN AEROBIC AND ANAEROBIC Blood Culture adequate volume   Culture   Final    NO GROWTH 2 DAYS Performed at [facility], [address], [place], [place] [address]    Report Status PENDING  Incomplete  Culture, blood (routine x 2)     Status: None (Preliminary result)   Collection Time: 02/08/23  4:13 PM   Specimen: BLOOD  Result Value Ref Range Status   Specimen Description BLOOD BLOOD RIGHT HAND  Final   Special Requests   Final    BOTTLES DRAWN AEROBIC AND ANAEROBIC Blood Culture adequate volume   Culture   Final    NO GROWTH 2 DAYS Performed at [facility], [address] [place], [place] [address]    Report Status PENDING  Incomplete         Radiology Studies: CT ABDOMEN PELVIS WO CONTRAST  Result Date: 02/09/2023 CLINICAL DATA:  One-week history of dysuria associated with nausea and fever/chills EXAM: CT ABDOMEN AND PELVIS WITHOUT CONTRAST TECHNIQUE: Multidetector CT imaging of the abdomen and pelvis was performed following the standard protocol without IV contrast. RADIATION DOSE REDUCTION: This exam was performed according to the departmental dose-optimization program which includes automated exposure control, adjustment of the mA and/or kV according to patient size and/or use of iterative reconstruction technique. COMPARISON:  CT abdomen and pelvis dated 10/07/2016 FINDINGS: Lower chest: No focal consolidation or pulmonary nodule in the lung bases. Small bilateral pleural effusions. Partially imaged heart size is normal. Hepatobiliary: No focal hepatic lesions. No intra or extrahepatic biliary ductal dilation. Cholecystectomy. Pancreas: Diffuse fatty replacement of the pancreas. Spleen: Normal in size without focal abnormality. Adrenals/Urinary Tract: No adrenal nodules. No suspicious renal mass or hydronephrosis. Bilateral renal stones measuring up to 7 mm in the right interpolar kidney. Mild right perinephric and periureteral stranding with mural thickening of the right renal collecting system. Multiple phleboliths along the course of the right ureter. No focal bladder wall thickening.  Stomach/Bowel: Moderate hiatal hernia. No evidence of bowel wall thickening, distention, or inflammatory changes. Colonic diverticulosis without acute diverticulitis. Appendectomy. Vascular/Lymphatic: Aortic atherosclerosis. No enlarged abdominal or pelvic lymph nodes. Reproductive: No adnexal masses. Other: Small volume free fluid.  No free air or fluid collection. Musculoskeletal: No acute or abnormal lytic or blastic osseous lesions. Status post right femoral fixation. Multilevel degenerative changes of the partially imaged thoracic and lumbar spine. Unchanged grade 1 anterolisthesis L3-4. Age indeterminate compression deformities of L5, new from 10/07/2016. Unchanged compression deformity of T11. Trace subcutaneous emphysema in the right anterior abdominal wall may be injection related. IMPRESSION: 1. Mild right perinephric and periureteral  stranding with mural thickening of the right renal collecting system, suspicious for ascending urinary tract infection. 2. Bilateral renal stones measuring up to 7 mm in the right interpolar kidney. No hydronephrosis. 3. Age indeterminate compression deformities of L5, new from 10/07/2016. 4. Small bilateral pleural effusions. 5. Moderate hiatal hernia. 6.  Aortic Atherosclerosis (ICD10-I70.0). Electronically Signed   By: Agustin Cree M.D.   On: 02/09/2023 09:31        Scheduled Meds:  clopidogrel  75 mg Oral Daily   enoxaparin (LOVENOX) injection  30 mg Subcutaneous Q24H   gabapentin  300 mg Oral QHS   levothyroxine  75 mcg Oral QAC breakfast   pantoprazole  40 mg Oral BID   rosuvastatin  5 mg Oral Q supper   sucralfate  1 g Oral BID WC   Continuous Infusions:  cefTRIAXone (ROCEPHIN)  IV 2 g (02/10/23 0625)     LOS: 1 day     Pennie Banter, DO [facility]   If 7PM-7AM, please contact night-coverage [email or web address] Password [facility] 02/10/2023, 12:30 PM

## 2023-02-10 NOTE — Progress Notes (Signed)
PHARMACIST - PHYSICIAN COMMUNICATION  CONCERNING:  Enoxaparin (Lovenox) for DVT Prophylaxis    RECOMMENDATION: Patient was prescribed enoxaprin 40mg  q24 hours for VTE prophylaxis.   Filed Weights   02/08/23 1541  Weight: 62.1 kg (137 lb)    Body mass index is 24.27 kg/m.  Estimated Creatinine Clearance: 28.2 mL/min (A) (by C-G formula based on SCr of 1.03 mg/dL (H)).   Patient is candidate for enoxaparin 30mg  every 24 hours based on CrCl <83ml/min   DESCRIPTION: Pharmacy has adjusted enoxaparin dose per [facility] policy.  Patient is now receiving enoxaparin 30 mg every 24 hours    Elliot Gurney, PharmD, BCPS Clinical Pharmacist  02/10/2023 8:57 AM

## 2023-02-10 NOTE — Progress Notes (Signed)
Per Dr Griffith, dc tele monitoring  

## 2023-02-10 NOTE — Progress Notes (Signed)
Mobility Specialist - Progress Note   02/10/23 1055  Mobility  Activity Ambulated with assistance in hallway;Stood at bedside;Dangled on edge of bed  Level of Assistance Standby assist, set-up cues, supervision of patient - no hands on  Assistive Device Front wheel walker  Distance Ambulated (ft) 240 ft  Activity Response Tolerated well  Mobility Referral Yes  $Mobility charge 1 Mobility  Mobility Specialist Start Time (ACUTE ONLY) 1038  Mobility Specialist Stop Time (ACUTE ONLY) 1055  Mobility Specialist Time Calculation (min) (ACUTE ONLY) 17 min   Pt supine in bed on RA upon arrival. Pt completes bed mobility HHA with extra time. Pt STS and ambulates in hallway SBA with no LOB noted. Pt returns to bed with needs in reach and bed alarm activated.   Terrilyn Saver  Mobility Specialist  02/10/23 10:57 AM

## 2023-02-10 NOTE — TOC Initial Note (Signed)
Transition of Care [facility]) - Initial/Assessment Note    Patient Details  Name: Erica Lane MRN: [id number] Date of Birth: 03-13-1928  [facility] CM/SW Contact:    Allena Katz, LCSW Phone Number: 02/10/2023, 12:19 PM  Clinical Narrative:   CSW spoke with patient regarding HH services. Pt reports that she does not think that is need at this time. Pt is active with Dr. Judithann Sheen for primary care. CSW explained if patient changes her mind she can acquire this through her PCP. Pt lives alone and has a friend that intermittently stays with her. Pt reports she drives some but friend assists with appointments. Pt reports she is independent at baseline. Pt has a BSC, wheelchair, rollator and RW from when her husband was alive but reports she does not utilize any of these. Pt reports no additional SDOH needs/concerns at this time. TOC to continue to follow.         Expected Discharge Plan: Home/Self Care Barriers to Discharge: Continued Medical Work up   Patient Goals and CMS Choice Patient states their goals for this hospitalization and ongoing recovery are:: return home CMS Medicare.gov Compare Post Acute Care list provided to:: Patient        Expected Discharge Plan and Services       Living arrangements for the past 2 months: Single Family Home                                      Prior Living Arrangements/Services Living arrangements for the past 2 months: Single Family Home Lives with:: Self Patient language and need for interpreter reviewed:: Yes        Need for Family Participation in Patient Care: No (Comment) Care giver support system in place?: No (comment) Current home services: DME Criminal Activity/Legal Involvement Pertinent to Current Situation/Hospitalization: Yes - Comment as needed  Activities of Daily Living Home Assistive Devices/Equipment: Cane (specify quad or straight) ADL Screening (condition at time of admission) Patient's  cognitive ability adequate to safely complete daily activities?: Yes Is the patient deaf or have difficulty hearing?: No Does the patient have difficulty seeing, even when wearing glasses/contacts?: No Does the patient have difficulty concentrating, remembering, or making decisions?: No Patient able to express need for assistance with ADLs?: Yes Does the patient have difficulty dressing or bathing?: No Independently performs ADLs?: Yes (appropriate for developmental age) Does the patient have difficulty walking or climbing stairs?: No Weakness of Legs: None Weakness of Arms/Hands: None  Permission Sought/Granted                  Emotional Assessment       Orientation: : Oriented to Self, Oriented to Place, Oriented to Situation, Oriented to  Time      Admission diagnosis:  UTI (urinary tract infection) [N39.0] Urinary tract infection with hematuria, site unspecified [N39.0, R31.9] Sepsis without acute organ dysfunction, due to unspecified organism [facility]) [A41.9] Patient Active Problem List   Diagnosis Date Noted   CKD stage 3a, GFR 45-59 ml/min (HCC) 02/09/2023   Sepsis (HCC) 02/09/2023   Acute pyelonephritis 02/09/2023   UTI (urinary tract infection) 02/08/2023   Hyponatremia 02/08/2023   Transaminitis 02/08/2023   Peripheral arterial disease (HCC) 03/17/2022   Uvular swelling 07/23/2018   Uvulitis 07/23/2018   Bradycardia 06/02/2018   Dizziness 09/29/2017   GERD (gastroesophageal reflux disease) 01/13/2017   Osteoporosis, post-menopausal 01/13/2017   Mixed  incontinence 01/13/2017   Vaginal atrophy 01/13/2017   Diabetes mellitus type 2, uncomplicated (HCC) 09/07/2016   Essential hypertension 11/15/2015   LVH (left ventricular hypertrophy) due to hypertensive disease, without heart failure 11/15/2015   Mixed hyperlipidemia 11/15/2015   Bilateral carotid artery stenosis 10/04/2015   Hypothyroidism, unspecified 04/02/2014   PCP:  Marguarite Arbour, MD Pharmacy:    [facility] [id number] [address],  - [address] [place] [address] Phone: [phone] Fax: [phone]  [id number] - [place], [place] - [address] [place] [address] Phone: [phone] Fax: [phone]     Social Determinants of Health (SDOH) Social History: SDOH Screenings   Food Insecurity: No Food Insecurity (02/09/2023)  Housing: Low Risk  (02/09/2023)  Transportation Needs: No Transportation Needs (02/09/2023)  Utilities: Not At Risk (02/09/2023)  Tobacco Use: Low Risk  (02/09/2023)   SDOH Interventions:     Readmission Risk Interventions     No data to display

## 2023-02-11 ENCOUNTER — Encounter: Payer: Self-pay | Admitting: Family Medicine

## 2023-02-11 DIAGNOSIS — I10 Essential (primary) hypertension: Secondary | ICD-10-CM | POA: Diagnosis not present

## 2023-02-11 DIAGNOSIS — N1831 Chronic kidney disease, stage 3a: Secondary | ICD-10-CM | POA: Diagnosis not present

## 2023-02-11 DIAGNOSIS — N10 Acute tubulo-interstitial nephritis: Secondary | ICD-10-CM | POA: Diagnosis not present

## 2023-02-11 LAB — CBC
HCT: 30.9 % — ABNORMAL LOW (ref 36.0–46.0)
Hemoglobin: 10.5 g/dL — ABNORMAL LOW (ref 12.0–15.0)
MCH: 32 pg (ref 26.0–34.0)
MCHC: 34 g/dL (ref 30.0–36.0)
MCV: 94.2 fL (ref 80.0–100.0)
Platelets: 122 10*3/uL — ABNORMAL LOW (ref 150–400)
RBC: 3.28 MIL/uL — ABNORMAL LOW (ref 3.87–5.11)
RDW: 13.1 % (ref 11.5–15.5)
WBC: 9.4 10*3/uL (ref 4.0–10.5)
nRBC: 0 % (ref 0.0–0.2)

## 2023-02-11 LAB — BASIC METABOLIC PANEL
Anion gap: 6 (ref 5–15)
BUN: 15 mg/dL (ref 8–23)
CO2: 24 mmol/L (ref 22–32)
Calcium: 8.7 mg/dL — ABNORMAL LOW (ref 8.9–10.3)
Chloride: 103 mmol/L (ref 98–111)
Creatinine, Ser: 0.92 mg/dL (ref 0.44–1.00)
GFR, Estimated: 58 mL/min — ABNORMAL LOW (ref >60)
Glucose, Bld: 104 mg/dL — ABNORMAL HIGH (ref 70–99)
Potassium: 3.7 mmol/L (ref 3.5–5.1)
Sodium: 133 mmol/L — ABNORMAL LOW (ref 135–145)

## 2023-02-11 LAB — CULTURE, BLOOD (ROUTINE X 2): Culture: NO GROWTH

## 2023-02-11 MED ORDER — SODIUM CHLORIDE 0.9 % IV SOLN: INTRAVENOUS | Status: DC | PRN

## 2023-02-11 MED ORDER — SULFAMETHOXAZOLE-TRIMETHOPRIM 800-160 MG PO TABS: 1.0000 | ORAL_TABLET | Freq: Two times a day (BID) | ORAL | Status: DC

## 2023-02-11 MED ORDER — SULFAMETHOXAZOLE-TRIMETHOPRIM 800-160 MG PO TABS: 1.0000 | ORAL_TABLET | Freq: Two times a day (BID) | ORAL | 0 refills | Status: AC

## 2023-02-11 MED ORDER — CARVEDILOL 3.125 MG PO TABS
3.1250 mg | ORAL_TABLET | Freq: Two times a day (BID) | ORAL | Status: DC
  Administered 2023-02-11: 3.125 mg via ORAL
  Filled 2023-02-11: qty 1

## 2023-02-11 MED ORDER — AMLODIPINE BESYLATE 10 MG PO TABS
10.0000 mg | ORAL_TABLET | Freq: Every day | ORAL | Status: DC
  Administered 2023-02-11: 10 mg via ORAL
  Filled 2023-02-11: qty 1

## 2023-02-11 NOTE — Discharge Summary (Addendum)
Physician Discharge Summary   Patient: Erica Lane MRN: [id number] DOB: 03/14/1928  Admit date:     02/08/2023  Discharge date: 02/11/2023  Discharge Physician: Pennie Banter   PCP: Marguarite Arbour, MD   Recommendations at discharge:    Follow up with Primary Care in 1-2 weeks Repeat CBC, CMP in 1-2 weeks  Discharge Diagnoses: Principal Problem:   UTI (urinary tract infection) Active Problems:   Bilateral carotid artery stenosis   Diabetes mellitus type 2, uncomplicated (HCC)   Essential hypertension   GERD (gastroesophageal reflux disease)   Hypothyroidism, unspecified   Mixed hyperlipidemia   Peripheral arterial disease (HCC)   Hyponatremia   CKD stage 3a, GFR 45-59 ml/min (HCC)   Acute pyelonephritis  Resolved Problems:   Transaminitis   CKD stage 3b, GFR 30-44 ml/min (HCC)   Sepsis [facility])  Hospital Course: Erica Lane is a 89 y.o. F with HTN, PVD, hypothyroidism who presented with UTI and hypotension.  Started to develop dysuria a few days ago, then yesterday had fever and called PCP for phone and antibiotic for UTI.  In the last 24 hours has had severe shaking rigors, and progressive generalized weakness until she could not stand up.  In the ER, sodium 131, lactate 3.1, blood pressure initially normal, slowly worsening. UA with pyruria. Given fluids and antibiotics and admitted for UTI.  Assessment and Plan:  # Acute Pyelonephritis / Complicated UTI One week dysuria, also with subjective fever. Patient does endorse mild flank pain today so checked non-con CT (given gfr) which shows right perinephric and periureteral stranding consistent w/ pyelo. No obstruction seen though does have kidney stones. - treated with IV ceftriaxone - urine culture grew multiple species - discharge on Bactrim-DS 7 more days, for 10 day course - PCP follow up   # Sepsis, severe - resolved # Metabolic acidosis - resolved Pt met severe sepsis with elevated lactate, leukocytosis.  Lactate normalized. Bicarb 18 6/26: acidosis improved with bicarb fluids - pt stable off IV fluids  - encourage PO hydration   # Debility - PT consult  - HH PT recommended - TOC consulted   # AKI on CKD stage 3a AKI - resolved Mild, cr 1.34, baseline 1, improved to 1.23 >> 1.03 with fluids - stable off IV  fluids - repeat labs at follow up   # Transaminitis - resolved 2/2 sepsis, resolved with fluids   # Thrombocytopenia - Mild, likely 2/2 sepsis - repeat CBC at follow up   # Hyponatremia - improved  Low 130s, possibly 2/2 dehydration from sepsis Improved to 133 with fluids - off IV fluids  - encourage PO hydration   # PAD Asymptomatic - cont home statin, plavix   # Hypothyroid - cont home synthroid   # HTN Here low bps on arrival with sepsis, now normalized - home amlodipine and coreg were initially held - meds resumed this AM with elevated BP's again - continue amlodipine and Coreg   # Overactive bladder - oxybutynin resumed at d/c   # Pain - cont home gabapentin   # GERD - home ppi and sucralfate         Consultants: None Procedures performed: None   Disposition: Home health  Diet recommendation:  Cardiac diet  DISCHARGE MEDICATION: Allergies as of 02/11/2023   No Known Allergies      Medication List     TAKE these medications    amLODipine 10 MG tablet Commonly known as: NORVASC Take 10 mg by  mouth daily with supper.   carvedilol 3.125 MG tablet Commonly known as: COREG Take 3.125 mg by mouth 2 (two) times daily with a meal.   clopidogrel 75 MG tablet Commonly known as: PLAVIX Take 75 mg by mouth daily.   [employer] CALCIUM CITRATE +D3 MINI PO Take 1 tablet by mouth 2 (two) times daily.   gabapentin 300 MG capsule Commonly known as: NEURONTIN Take 300 mg by mouth at bedtime.   ibandronate 150 MG tablet Commonly known as: BONIVA Take 150 mg by mouth every 30 (thirty) days. Take in the morning with a full glass of water, on an  empty stomach, and do not take anything else by mouth or lie down for the next 30 min.   levothyroxine 75 MCG tablet Commonly known as: SYNTHROID Take 75 mcg by mouth daily before breakfast.   oxybutynin 10 MG 24 hr tablet Commonly known as: DITROPAN-XL Take 10 mg by mouth at bedtime.   pantoprazole 40 MG tablet Commonly known as: PROTONIX Take 40 mg by mouth 2 (two) times daily.   rosuvastatin 5 MG tablet Commonly known as: CRESTOR Take 5 mg by mouth daily with supper.   sucralfate 1 g tablet Commonly known as: CARAFATE Take 1 g by mouth 2 (two) times daily with a meal.   sulfamethoxazole-trimethoprim 800-160 MG tablet Commonly known as: BACTRIM DS Take 1 tablet by mouth every 12 (twelve) hours for 7 days. Start taking on: February 12, 2023   Vitamin D (Ergocalciferol) 1.25 MG (50000 UNIT) Caps capsule Commonly known as: DRISDOL Take 50,000 Units by mouth every Thursday.        Discharge Exam: Filed Weights   02/08/23 1541  Weight: 62.1 kg   General exam: awake, alert, no acute distress HEENT: atraumatic, clear conjunctiva, anicteric sclera, moist mucus membranes, hearing grossly normal  Respiratory system: CTAB, no wheezes, rales or rhonchi, normal respiratory effort. Cardiovascular system: normal S1/S2, RRR, no JVD, murmurs, rubs, gallops,  no pedal edema.   Gastrointestinal system: soft, NT, ND, no HSM felt, +bowel sounds. Central nervous system: A&O x 3. no gross focal neurologic deficits, normal speech Extremities: moves all, no edema, normal tone Skin: dry, intact, normal temperature, normal color, No rashes, lesions or ulcers Psychiatry: normal mood, congruent affect, judgement and insight appear normal   Condition at discharge: stable  The results of significant diagnostics from this hospitalization (including imaging, microbiology, ancillary and laboratory) are listed below for reference.   Imaging Studies: CT ABDOMEN PELVIS WO CONTRAST  Result Date:  02/09/2023 CLINICAL DATA:  One-week history of dysuria associated with nausea and fever/chills EXAM: CT ABDOMEN AND PELVIS WITHOUT CONTRAST TECHNIQUE: Multidetector CT imaging of the abdomen and pelvis was performed following the standard protocol without IV contrast. RADIATION DOSE REDUCTION: This exam was performed according to the departmental dose-optimization program which includes automated exposure control, adjustment of the mA and/or kV according to patient size and/or use of iterative reconstruction technique. COMPARISON:  CT abdomen and pelvis dated 10/07/2016 FINDINGS: Lower chest: No focal consolidation or pulmonary nodule in the lung bases. Small bilateral pleural effusions. Partially imaged heart size is normal. Hepatobiliary: No focal hepatic lesions. No intra or extrahepatic biliary ductal dilation. Cholecystectomy. Pancreas: Diffuse fatty replacement of the pancreas. Spleen: Normal in size without focal abnormality. Adrenals/Urinary Tract: No adrenal nodules. No suspicious renal mass or hydronephrosis. Bilateral renal stones measuring up to 7 mm in the right interpolar kidney. Mild right perinephric and periureteral stranding with mural thickening of the right renal collecting  system. Multiple phleboliths along the course of the right ureter. No focal bladder wall thickening. Stomach/Bowel: Moderate hiatal hernia. No evidence of bowel wall thickening, distention, or inflammatory changes. Colonic diverticulosis without acute diverticulitis. Appendectomy. Vascular/Lymphatic: Aortic atherosclerosis. No enlarged abdominal or pelvic lymph nodes. Reproductive: No adnexal masses. Other: Small volume free fluid.  No free air or fluid collection. Musculoskeletal: No acute or abnormal lytic or blastic osseous lesions. Status post right femoral fixation. Multilevel degenerative changes of the partially imaged thoracic and lumbar spine. Unchanged grade 1 anterolisthesis L3-4. Age indeterminate compression  deformities of L5, new from 10/07/2016. Unchanged compression deformity of T11. Trace subcutaneous emphysema in the right anterior abdominal wall may be injection related. IMPRESSION: 1. Mild right perinephric and periureteral stranding with mural thickening of the right renal collecting system, suspicious for ascending urinary tract infection. 2. Bilateral renal stones measuring up to 7 mm in the right interpolar kidney. No hydronephrosis. 3. Age indeterminate compression deformities of L5, new from 10/07/2016. 4. Small bilateral pleural effusions. 5. Moderate hiatal hernia. 6.  Aortic Atherosclerosis (ICD10-I70.0). Electronically Signed   By: Agustin Cree M.D.   On: 02/09/2023 09:31    Microbiology: Results for orders placed or performed during the hospital encounter of 02/08/23  Urine Culture     Status: Abnormal   Collection Time: 02/08/23  4:03 PM   Specimen: Urine, Random  Result Value Ref Range Status   Specimen Description   Final    URINE, RANDOM Performed at [facility], [address] [place], [place] [address]    Special Requests   Final    NONE Reflexed from [id number] Performed at [facility], [address] [place], [place] [address]    Culture MULTIPLE SPECIES PRESENT, SUGGEST RECOLLECTION (A)  Final   Report Status 02/10/2023 FINAL  Final  Culture, blood (routine x 2)     Status: None (Preliminary result)   Collection Time: 02/08/23  4:08 PM   Specimen: BLOOD  Result Value Ref Range Status   Specimen Description BLOOD BLOOD LEFT HAND  Final   Special Requests   Final    BOTTLES DRAWN AEROBIC AND ANAEROBIC Blood Culture adequate volume   Culture   Final    NO GROWTH 3 DAYS Performed at [facility], [address] [place], [place] [address]    Report Status PENDING  Incomplete  Culture, blood (routine x 2)     Status: None (Preliminary result)   Collection Time: 02/08/23  4:13 PM   Specimen: BLOOD  Result Value Ref Range Status   Specimen  Description BLOOD BLOOD RIGHT HAND  Final   Special Requests   Final    BOTTLES DRAWN AEROBIC AND ANAEROBIC Blood Culture adequate volume   Culture   Final    NO GROWTH 3 DAYS Performed at [facility], [address] [place], [place] [address]    Report Status PENDING  Incomplete    Labs: CBC: Recent Labs  Lab 02/08/23 1545 02/09/23 0356 02/10/23 0800 02/11/23 0352  WBC 11.7* 21.5* 14.2* 9.4  NEUTROABS 10.9*  --   --   --   HGB 10.6* 10.6* 11.0* 10.5*  HCT 31.0* 32.2* 32.7* 30.9*  MCV 96.0 97.6 95.1 94.2  PLT 147* 122* 124* 122*   Basic Metabolic Panel: Recent Labs  Lab 02/08/23 1545 02/09/23 0356 02/10/23 0800 02/11/23 0352  NA 131* 130* 133* 133*  K 3.6 4.1 3.8 3.7  CL 103 105 103 103  CO2 20* 18* 24  24  GLUCOSE 121* 105* 101* 104*  BUN 30* 25* 21 15  CREATININE 1.34* 1.23* 1.03* 0.92  CALCIUM 9.5 8.6* 8.9 8.7*   Liver Function Tests: Recent Labs  Lab 02/08/23 1545 02/09/23 0356  AST 59* 36  ALT 55* 44  ALKPHOS 63 51  BILITOT 1.5* 0.8  PROT 5.2* 4.9*  ALBUMIN 2.6* 2.4*   CBG: No results for input(s): "GLUCAP" in the last 168 hours.  Discharge time spent: greater than 30 minutes.  Signed: Pennie Banter, DO [facility] 02/11/2023

## 2023-02-11 NOTE — Progress Notes (Signed)
Mobility Specialist - Progress Note   02/11/23 1021  Mobility  Activity Ambulated with assistance in hallway;Stood at bedside;Dangled on edge of bed  Level of Assistance Standby assist, set-up cues, supervision of patient - no hands on  Assistive Device Front wheel walker  Distance Ambulated (ft) 200 ft  Activity Response Tolerated well  Mobility Referral Yes  $Mobility charge 1 Mobility  Mobility Specialist Start Time (ACUTE ONLY) 0947  Mobility Specialist Stop Time (ACUTE ONLY) 1004  Mobility Specialist Time Calculation (min) (ACUTE ONLY) 17 min   Pt supine in bed on RA upon arrival. Pt completes bed mobility HHA. Pt STS and ambulates in hallway Supervision with no LOB noted. Pt returns to bed with needs in reach and family present.   Terrilyn Saver  Mobility Specialist  02/11/23 10:22 AM

## 2023-02-13 LAB — CULTURE, BLOOD (ROUTINE X 2)
Special Requests: ADEQUATE
Special Requests: ADEQUATE

## 2023-09-02 ENCOUNTER — Ambulatory Visit: Payer: Medicare PPO | Admitting: Dermatology

## 2023-09-15 ENCOUNTER — Ambulatory Visit: Payer: Medicare PPO | Admitting: Dermatology

## 2023-09-15 DIAGNOSIS — L820 Inflamed seborrheic keratosis: Secondary | ICD-10-CM

## 2023-09-15 DIAGNOSIS — W908XXA Exposure to other nonionizing radiation, initial encounter: Secondary | ICD-10-CM

## 2023-09-15 DIAGNOSIS — D1801 Hemangioma of skin and subcutaneous tissue: Secondary | ICD-10-CM

## 2023-09-15 DIAGNOSIS — Z85828 Personal history of other malignant neoplasm of skin: Secondary | ICD-10-CM

## 2023-09-15 DIAGNOSIS — I8393 Asymptomatic varicose veins of bilateral lower extremities: Secondary | ICD-10-CM

## 2023-09-15 DIAGNOSIS — L821 Other seborrheic keratosis: Secondary | ICD-10-CM | POA: Diagnosis not present

## 2023-09-15 DIAGNOSIS — L578 Other skin changes due to chronic exposure to nonionizing radiation: Secondary | ICD-10-CM | POA: Diagnosis not present

## 2023-09-15 DIAGNOSIS — D229 Melanocytic nevi, unspecified: Secondary | ICD-10-CM

## 2023-09-15 DIAGNOSIS — I781 Nevus, non-neoplastic: Secondary | ICD-10-CM

## 2023-09-15 DIAGNOSIS — Z1283 Encounter for screening for malignant neoplasm of skin: Secondary | ICD-10-CM | POA: Diagnosis not present

## 2023-09-15 DIAGNOSIS — L853 Xerosis cutis: Secondary | ICD-10-CM

## 2023-09-15 DIAGNOSIS — D2239 Melanocytic nevi of other parts of face: Secondary | ICD-10-CM

## 2023-09-15 DIAGNOSIS — L814 Other melanin hyperpigmentation: Secondary | ICD-10-CM

## 2023-09-15 DIAGNOSIS — L570 Actinic keratosis: Secondary | ICD-10-CM

## 2023-09-15 NOTE — Progress Notes (Signed)
Follow-Up Visit   Subjective  Erica Lane is a 89 y.o. female who presents for the following: Skin Cancer Screening and Full Body Skin Exam  The patient presents for Total-Body Skin Exam (TBSE) for skin cancer screening and mole check. The patient has spots, moles and lesions to be evaluated, some may be new or changing and the patient may have concern these could be cancer.  She has spots under her arm that get irritated.  The following portions of the chart were reviewed this encounter and updated as appropriate: medications, allergies, medical history  Review of Systems:  No other skin or systemic complaints except as noted in HPI or Assessment and Plan.  Objective  Well appearing patient in no apparent distress; mood and affect are within normal limits.  A full examination was performed including scalp, head, eyes, ears, nose, lips, neck, chest, axillae, abdomen, back, buttocks, bilateral upper extremities, bilateral lower extremities, hands, feet, fingers, toes, fingernails, and toenails. All findings within normal limits unless otherwise noted below.   Relevant physical exam findings are noted in the Assessment and Plan.  Right Axilla x 2, R neck x 1 (3) Erythematous stuck-on, waxy papule or plaque R nasal dorsum x 3 (3) Pink scaly macules    Assessment & Plan   SKIN CANCER SCREENING PERFORMED TODAY.  SEBORRHEIC KERATOSIS - L med ankle 3.5 x 2.5 cm waxy pink patch, see photo - Stuck-on, waxy, tan-brown papules and/or plaques  - Benign-appearing - Discussed benign etiology and prognosis. - Observe - Call for any changes  ACTINIC DAMAGE - Chronic condition, secondary to cumulative UV/sun exposure - diffuse scaly erythematous macules with underlying dyspigmentation - Recommend daily broad spectrum sunscreen SPF 30+ to sun-exposed areas, reapply every 2 hours as needed.  - Staying in the shade or wearing long sleeves, sun glasses (UVA+UVB protection) and wide brim  hats (4-inch brim around the entire circumference of the hat) are also recommended for sun protection.  - Call for new or changing lesions.  LENTIGINES, SEBORRHEIC KERATOSES, HEMANGIOMAS - Benign normal skin lesions - Benign-appearing - Call for any changes  MELANOCYTIC NEVI - Flesh pedunculated papule R infraocular, benign, discussed removal if bothersome. - Tan-brown and/or pink-flesh-colored symmetric macules and papules - Benign appearing on exam today - Observation - Call clinic for new or changing moles - Recommend daily use of broad spectrum spf 30+ sunscreen to sun-exposed areas.   HISTORY OF SQUAMOUS CELL CARCINOMA OF THE SKIN - L prox pretibial 2021 - No evidence of recurrence today - Recommend regular full body skin exams - Recommend daily broad spectrum sunscreen SPF 30+ to sun-exposed areas, reapply every 2 hours as needed.  - Call if any new or changing lesions are noted between office visits  Xerosis - diffuse xerotic patches - recommend gentle, hydrating skin care - gentle skin care handout given  Varicose Veins/Spider Veins - Dilated blue, purple or red veins at the lower extremities - Reassured - Smaller vessels can be treated by sclerotherapy (a procedure to inject a medicine into the veins to make them disappear) if desired, but the treatment is not covered by insurance. Larger vessels may be covered if symptomatic and we would refer to vascular surgeon if treatment desired.  SKIN CANCER SCREENING   INFLAMED SEBORRHEIC KERATOSIS (3) Right Axilla x 2, R neck x 1 (3) Symptomatic, irritating, patient would like treated.  Destruction of lesion - Right Axilla x 2, R neck x 1 (3)  Destruction method: cryotherapy  Informed consent: discussed and consent obtained   Lesion destroyed using liquid nitrogen: Yes   Region frozen until ice ball extended beyond lesion: Yes   Outcome: patient tolerated procedure well with no complications   Post-procedure details:  wound care instructions given   Additional details:  Prior to procedure, discussed risks of blister formation, small wound, skin dyspigmentation, or rare scar following cryotherapy. Recommend Vaseline ointment to treated areas while healing.  AK (ACTINIC KERATOSIS) (3) R nasal dorsum x 3 (3) Actinic keratoses are precancerous spots that appear secondary to cumulative UV radiation exposure/sun exposure over time. They are chronic with expected duration over 1 year. A portion of actinic keratoses will progress to squamous cell carcinoma of the skin. It is not possible to reliably predict which spots will progress to skin cancer and so treatment is recommended to prevent development of skin cancer.  Recommend daily broad spectrum sunscreen SPF 30+ to sun-exposed areas, reapply every 2 hours as needed.  Recommend staying in the shade or wearing long sleeves, sun glasses (UVA+UVB protection) and wide brim hats (4-inch brim around the entire circumference of the hat). Call for new or changing lesions.  Destruction of lesion - R nasal dorsum x 3 (3)  Destruction method: cryotherapy   Informed consent: discussed and consent obtained   Lesion destroyed using liquid nitrogen: Yes   Region frozen until ice ball extended beyond lesion: Yes   Outcome: patient tolerated procedure well with no complications   Post-procedure details: wound care instructions given   Additional details:  Prior to procedure, discussed risks of blister formation, small wound, skin dyspigmentation, or rare scar following cryotherapy. Recommend Vaseline ointment to treated areas while healing.  SEBORRHEIC KERATOSIS   ACTINIC SKIN DAMAGE   LENTIGO   HEMANGIOMA OF SKIN   NEVUS   HISTORY OF SCC (SQUAMOUS CELL CARCINOMA) OF SKIN   XEROSIS CUTIS   SPIDER VEINS     Return in about 1 year (around 09/14/2024) for TBSE.  Maylene Roes, CMA, am acting as scribe for Willeen Niece, MD .  Documentation: I have reviewed  the above documentation for accuracy and completeness, and I agree with the above.  Willeen Niece, MD

## 2023-09-15 NOTE — Patient Instructions (Addendum)
Gentle Skin Care Guide  1. Bathe no more than once a day.  2. Avoid bathing in hot water  3. Use a mild soap like Dove, Vanicream, Cetaphil, CeraVe. Can use Lever 2000 or Cetaphil antibacterial soap  4. Use soap only where you need it. On most days, use it under your arms, between your legs, and on your feet. Let the water rinse other areas unless visibly dirty.  5. When you get out of the bath/shower, use a towel to gently blot your skin dry, don't rub it.  6. While your skin is still a little damp, apply a moisturizing cream such as Vanicream, CeraVe, Cetaphil, Eucerin, Sarna lotion or plain Vaseline Jelly. For hands apply Neutrogena [place] Hand Cream or Excipial Hand Cream.  7. Reapply moisturizer any time you start to itch or feel dry.  8. Sometimes using free and clear laundry detergents can be helpful. Fabric softener sheets should be avoided. Downy Free & Gentle liquid, or any liquid fabric softener that is free of dyes and perfumes, it acceptable to use  9. If your doctor has given you prescription creams you may apply moisturizers over them    Due to recent changes in healthcare laws, you may see results of your pathology and/or laboratory studies on MyChart before the doctors have had a chance to review them. We understand that in some cases there may be results that are confusing or concerning to you. Please understand that not all results are received at the same time and often the doctors may need to interpret multiple results in order to provide you with the best plan of care or course of treatment. Therefore, we ask that you please give [place] 2 business days to thoroughly review all your results before contacting the office for clarification. Should we see a critical lab result, you will be contacted sooner.   If You Need Anything After Your Visit  If you have any questions or concerns for your doctor, please call our main line at [phone] and press option 4 to reach your  doctor's medical assistant. If no one answers, please leave a voicemail as directed and we will return your call as soon as possible. Messages left after 4 pm will be answered the following business day.   You may also send [place] a message via MyChart. We typically respond to MyChart messages within 1-2 business days.  For prescription refills, please ask your pharmacy to contact our office. Our fax number is [phone].  If you have an urgent issue when the clinic is closed that cannot wait until the next business day, you can page your doctor at the number below.    Please note that while we do our best to be available for urgent issues outside of office hours, we are not available 24/7.   If you have an urgent issue and are unable to reach [place], you may choose to seek medical care at your doctor's office, retail clinic, urgent care center, or emergency room.  If you have a medical emergency, please immediately call 911 or go to the emergency department.  Pager Numbers  - Dr. Gwen Pounds: [phone]  - Dr. Roseanne Reno: [phone]  - Dr. Katrinka Blazing: [phone]   In the event of inclement weather, please call our main line at [phone] for an update on the status of any delays or closures.  Dermatology Medication Tips: Please keep the boxes that topical medications come in in order to help keep track of the instructions about where  and how to use these. Pharmacies typically print the medication instructions only on the boxes and not directly on the medication tubes.   If your medication is too expensive, please contact our office at [phone] option 4 or send [place] a message through MyChart.   We are unable to tell what your co-pay for medications will be in advance as this is different depending on your insurance coverage. However, we may be able to find a substitute medication at lower cost or fill out paperwork to get insurance to cover a needed medication.   If a prior authorization is  required to get your medication covered by your insurance company, please allow [place] 1-2 business days to complete this process.  Drug prices often vary depending on where the prescription is filled and some pharmacies may offer cheaper prices.  The website www.goodrx.com contains coupons for medications through different pharmacies. The prices here do not account for what the cost may be with help from insurance (it may be cheaper with your insurance), but the website can give you the price if you did not use any insurance.  - You can print the associated coupon and take it with your prescription to the pharmacy.  - You may also stop by our office during regular business hours and pick up a GoodRx coupon card.  - If you need your prescription sent electronically to a different pharmacy, notify our office through [facility] or by phone at [phone] option 4.     Si Usted Necesita Algo Despus de Su Visita  Tambin puede enviarnos un mensaje a travs de Clinical cytogeneticist. Por lo general respondemos a los mensajes de MyChart en el transcurso de 1 a 2 das hbiles.  Para renovar recetas, por favor pida a su farmacia que se ponga en contacto con nuestra oficina. Annie Sable de fax es [place] [phone].  Si tiene un asunto urgente cuando la clnica est cerrada y que no puede esperar hasta el siguiente da hbil, puede llamar/localizar a su doctor(a) al nmero que aparece a continuacin.   Por favor, tenga en cuenta que aunque hacemos todo lo posible para estar disponibles para asuntos urgentes fuera del horario de [place], no estamos disponibles las 24 horas del da, los 7 [address]  [address] de la [place].   Si tiene un problema urgente y no puede comunicarse con nosotros, puede optar por buscar atencin mdica  en el consultorio de su doctor(a), en una clnica privada, en un centro de atencin urgente o en una sala de emergencias.  Si tiene Engineer, drilling, por favor llame inmediatamente al 911 o vaya a  la sala de emergencias.  Nmeros de bper  - Dr. Gwen Pounds: [phone]  - Dra. Roseanne Reno: [phone]  - Dr. Katrinka Blazing: [phone]   En caso de inclemencias del tiempo, por favor llame a Lacy Duverney principal al [phone] para una actualizacin sobre el [place] de cualquier retraso o cierre.  Consejos para la medicacin en dermatologa: Por favor, guarde las cajas en las que vienen los medicamentos de uso tpico para ayudarle a seguir las instrucciones sobre dnde y cmo usarlos. Las farmacias generalmente imprimen las instrucciones del medicamento slo en las cajas y no directamente en los tubos del [place].   Si su medicamento es muy caro, por favor, pngase en contacto con Rolm Gala llamando al [phone] y presione la opcin 4 o envenos un mensaje a travs de Clinical cytogeneticist.   No podemos decirle cul ser su copago por los medicamentos por adelantado ya que esto  es diferente dependiendo de la cobertura de su seguro. Sin embargo, es posible que podamos encontrar un medicamento sustituto a Audiological scientist un formulario para que el seguro cubra el medicamento que se considera necesario.   Si se requiere una autorizacin previa para que su compaa de seguros [place] su medicamento, por favor permtanos de 1 a 2 das hbiles para completar [address]  Los precios de los medicamentos varan con frecuencia dependiendo del Environmental consultant de dnde se surte la receta y alguna farmacias pueden ofrecer precios ms baratos.  El sitio web www.goodrx.com tiene cupones para medicamentos de Health and safety inspector. Los precios aqu no tienen en cuenta lo que podra costar con la ayuda del seguro (puede ser ms barato con su seguro), pero el sitio web puede darle el precio si no utiliz Tourist information centre manager.  - Puede imprimir el cupn correspondiente y llevarlo con su receta a la farmacia.  - Tambin puede pasar por nuestra oficina durante el horario de atencin regular y Education officer, museum una tarjeta de cupones de  GoodRx.  - Si necesita que su receta se enve electrnicamente a una farmacia diferente, informe a nuestra oficina a travs de MyChart de [place] o por telfono llamando al [phone] y presione la opcin 4.

## 2024-09-12 ENCOUNTER — Ambulatory Visit: Payer: Medicare PPO | Admitting: Dermatology

## 2024-09-13 ENCOUNTER — Ambulatory Visit: Payer: Medicare PPO | Admitting: Dermatology

## 2024-09-13 ENCOUNTER — Encounter: Payer: Self-pay | Admitting: Dermatology

## 2024-09-13 DIAGNOSIS — Z1283 Encounter for screening for malignant neoplasm of skin: Secondary | ICD-10-CM

## 2024-09-13 DIAGNOSIS — L578 Other skin changes due to chronic exposure to nonionizing radiation: Secondary | ICD-10-CM | POA: Diagnosis not present

## 2024-09-13 DIAGNOSIS — D229 Melanocytic nevi, unspecified: Secondary | ICD-10-CM

## 2024-09-13 DIAGNOSIS — L821 Other seborrheic keratosis: Secondary | ICD-10-CM | POA: Diagnosis not present

## 2024-09-13 DIAGNOSIS — D692 Other nonthrombocytopenic purpura: Secondary | ICD-10-CM | POA: Diagnosis not present

## 2024-09-13 DIAGNOSIS — Z85828 Personal history of other malignant neoplasm of skin: Secondary | ICD-10-CM | POA: Diagnosis not present

## 2024-09-13 DIAGNOSIS — Z8589 Personal history of malignant neoplasm of other organs and systems: Secondary | ICD-10-CM

## 2024-09-13 DIAGNOSIS — D1801 Hemangioma of skin and subcutaneous tissue: Secondary | ICD-10-CM | POA: Diagnosis not present

## 2024-09-13 DIAGNOSIS — I8393 Asymptomatic varicose veins of bilateral lower extremities: Secondary | ICD-10-CM

## 2024-09-13 DIAGNOSIS — L820 Inflamed seborrheic keratosis: Secondary | ICD-10-CM

## 2024-09-13 DIAGNOSIS — L814 Other melanin hyperpigmentation: Secondary | ICD-10-CM | POA: Diagnosis not present

## 2024-09-13 DIAGNOSIS — W908XXA Exposure to other nonionizing radiation, initial encounter: Secondary | ICD-10-CM

## 2024-09-13 NOTE — Progress Notes (Signed)
 "  Follow-Up Visit   Subjective  Erica Lane is a 89 y.o. female who presents for the following: Skin Cancer Screening and Full Body Skin Exam  The patient presents for Total-Body Skin Exam (TBSE) for skin cancer screening and mole check. The patient has spots, moles and lesions to be evaluated, some may be new or changing and the patient may have concern these could be cancer.  The following portions of the chart were reviewed this encounter and updated as appropriate: medications, allergies, medical history  Review of Systems:  No other skin or systemic complaints except as noted in HPI or Assessment and Plan.  Objective  Well appearing patient in no apparent distress; mood and affect are within normal limits.  A full examination was performed including scalp, head, eyes, ears, nose, lips, neck, chest, axillae, abdomen, back, buttocks, bilateral upper extremities, bilateral lower extremities, hands, feet, fingers, toes, fingernails, and toenails. All findings within normal limits unless otherwise noted below.   Relevant physical exam findings are noted in the Assessment and Plan.  L cheek x 1, L forehead x 2, L med ankle x 1 (4) Erythematous stuck-on, waxy papule or plaque  Assessment & Plan   SKIN CANCER SCREENING PERFORMED TODAY.  ACTINIC DAMAGE - Chronic condition, secondary to cumulative UV/sun exposure - diffuse scaly erythematous macules with underlying dyspigmentation - Recommend daily broad spectrum sunscreen SPF 30+ to sun-exposed areas, reapply every 2 hours as needed.  - Staying in the shade or wearing long sleeves, sun glasses (UVA+UVB protection) and wide brim hats (4-inch brim around the entire circumference of the hat) are also recommended for sun protection.  - Call for new or changing lesions.  LENTIGINES, SEBORRHEIC KERATOSES, HEMANGIOMAS - Benign normal skin lesions - Benign-appearing - Call for any changes  MELANOCYTIC NEVI - Tan-brown and/or  pink-flesh-colored symmetric macules and papules - Benign appearing on exam today - Observation - Call clinic for new or changing moles - Recommend daily use of broad spectrum spf 30+ sunscreen to sun-exposed areas.   HISTORY OF SQUAMOUS CELL CARCINOMA OF THE SKIN - No evidence of recurrence today - No lymphadenopathy - Recommend regular full body skin exams - Recommend daily broad spectrum sunscreen SPF 30+ to sun-exposed areas, reapply every 2 hours as needed.  - Call if any new or changing lesions are noted between office visits  Purpura - Chronic; persistent and recurrent.  Treatable, but not curable. - Violaceous macules and patches - Benign - Related to trauma, age, sun damage and/or use of blood thinners, chronic use of topical and/or oral steroids - Observe - Can use OTC arnica containing moisturizer such as Dermend Bruise Formula if desired - Call for worsening or other concerns  Varicose Veins/Spider Veins - Dilated blue, purple or red veins at the lower extremities - Reassured - Smaller vessels can be treated by sclerotherapy (a procedure to inject a medicine into the veins to make them disappear) if desired, but the treatment is not covered by insurance. Larger vessels may be covered if symptomatic and we would refer to vascular surgeon if treatment desired. INFLAMED SEBORRHEIC KERATOSIS (4) L cheek x 1, L forehead x 2, L med ankle x 1 (4) Symptomatic, irritating, patient would like treated.  - Destruction of lesion - L cheek x 1, L forehead x 2, L med ankle x 1 (4) Complexity: simple   Destruction method: cryotherapy   Informed consent: discussed and consent obtained   Timeout:  patient name, date of birth, surgical  site, and procedure verified Lesion destroyed using liquid nitrogen: Yes   Region frozen until ice ball extended beyond lesion: Yes   Outcome: patient tolerated procedure well with no complications   Post-procedure details: wound care instructions given      Return in about 1 year (around 09/13/2025) for TBSE - hx SCC, ISK.  I, Rosina Mayans, CMA, am acting as scribe for Alm Rhyme, MD .   Documentation: I have reviewed the above documentation for accuracy and completeness, and I agree with the above.  Alm Rhyme, MD    "

## 2024-09-13 NOTE — Patient Instructions (Signed)

## 2025-09-13 ENCOUNTER — Ambulatory Visit: Admitting: Dermatology
